# Patient Record
Sex: Female | Born: 1990 | Hispanic: No | Marital: Single | Smoking: Current some day smoker
Health system: Southern US, Community
[De-identification: ages and names within clinical notes are randomized; demographics above are authoritative.]

## PROBLEM LIST (undated history)

## (undated) ENCOUNTER — Inpatient Hospital Stay (HOSPITAL_COMMUNITY): Payer: Self-pay

## (undated) DIAGNOSIS — O1495 Unspecified pre-eclampsia, complicating the puerperium: Secondary | ICD-10-CM

## (undated) DIAGNOSIS — B1910 Unspecified viral hepatitis B without hepatic coma: Secondary | ICD-10-CM

## (undated) DIAGNOSIS — IMO0002 Reserved for concepts with insufficient information to code with codable children: Secondary | ICD-10-CM

## (undated) DIAGNOSIS — A491 Streptococcal infection, unspecified site: Secondary | ICD-10-CM

## (undated) DIAGNOSIS — R87619 Unspecified abnormal cytological findings in specimens from cervix uteri: Secondary | ICD-10-CM

## (undated) DIAGNOSIS — E669 Obesity, unspecified: Secondary | ICD-10-CM

## (undated) DIAGNOSIS — D649 Anemia, unspecified: Secondary | ICD-10-CM

## (undated) DIAGNOSIS — B977 Papillomavirus as the cause of diseases classified elsewhere: Secondary | ICD-10-CM

## (undated) HISTORY — PX: LEEP: SHX91

## (undated) HISTORY — DX: Obesity, unspecified: E66.9

## (undated) HISTORY — PX: DILATION AND CURETTAGE OF UTERUS: SHX78

## (undated) HISTORY — DX: Unspecified pre-eclampsia, complicating the puerperium: O14.95

---
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
## 2011-04-08 ENCOUNTER — Encounter (HOSPITAL_COMMUNITY): Payer: Self-pay | Admitting: *Deleted

## 2011-04-08 ENCOUNTER — Inpatient Hospital Stay (HOSPITAL_COMMUNITY)
Admission: AD | Admit: 2011-04-08 | Discharge: 2011-04-08 | Disposition: A | Discharge status: Home / Self Care | Payer: Self-pay | Source: Ambulatory Visit | Attending: Obstetrics & Gynecology | Admitting: Obstetrics & Gynecology

## 2011-04-08 DIAGNOSIS — O210 Mild hyperemesis gravidarum: Secondary | ICD-10-CM | POA: Insufficient documentation

## 2011-04-08 DIAGNOSIS — Z3201 Encounter for pregnancy test, result positive: Secondary | ICD-10-CM | POA: Insufficient documentation

## 2011-04-08 LAB — POCT PREGNANCY, URINE: Preg Test, Ur: POSITIVE

## 2011-04-08 LAB — URINALYSIS, ROUTINE W REFLEX MICROSCOPIC
Glucose, UA: NEGATIVE mg/dL
Leukocytes, UA: NEGATIVE
Specific Gravity, Urine: >1.03 — ABNORMAL HIGH (ref 1.005–1.030)
pH: 6 (ref 5.0–8.0)

## 2011-04-08 MED ORDER — PROMETHAZINE HCL 25 MG PO TABS: 25.0000 mg | ORAL_TABLET | Freq: Four times a day (QID) | ORAL | Status: AC | PRN

## 2011-04-08 NOTE — Progress Notes (Signed)
Pt states she had a POS HPT about one week ago. Has had some gas like pain on both sides above the waist level, no pain at this time. Pt states she has terminated a pregnancy and is concerned that everything is OK. No bleeding, some nausea, no vomiting, and fatigue.

## 2011-04-08 NOTE — ED Provider Notes (Signed)
History   Pt presents today c/o feeling tired, nausea, and some mild lower abd cramping that comes and goes. She denies vag dc, bleeding, or any other sx. She states she has had a termination in the past and she wants to make sure everything is ok with this preg.  Chief Complaint  Patient presents with  . Possible Pregnancy   HPI  OB History    Grav Para Term Preterm Abortions TAB SAB Ect Mult Living   5 2 2  0 2 2 0 0 0 2      No past medical history on file.  No past surgical history on file.  No family history on file.  History  Substance Use Topics  . Smoking status: Not on file  . Smokeless tobacco: Not on file  . Alcohol Use: Not on file    Allergies: No Known Allergies  No prescriptions prior to admission    Review of Systems  Constitutional: Negative for fever.  Cardiovascular: Negative for chest pain.  Gastrointestinal: Positive for nausea and abdominal pain. Negative for vomiting, diarrhea and constipation.  Genitourinary: Negative for dysuria, urgency, frequency and hematuria.  Neurological: Negative for dizziness and headaches.  Psychiatric/Behavioral: Negative for depression and suicidal ideas.   Physical Exam   Blood pressure 117/78, pulse 74, temperature 98.6 F (37 C), temperature source Oral, resp. rate 16, height 5\' 7"  (1.702 m), weight 164 lb 6.4 oz (74.571 kg), last menstrual period 02/19/2011, SpO2 99.00%.  Physical Exam  Constitutional: She is oriented to person, place, and time. She appears well-developed and well-nourished. No distress.  HENT:  Head: Normocephalic and atraumatic.  Eyes: EOM are normal. Pupils are equal, round, and reactive to light.  GI: Soft. She exhibits no distension. There is no tenderness. There is no rebound and no guarding.  Neurological: She is alert and oriented to person, place, and time.  Skin: Skin is warm and dry. She is not diaphoretic.  Psychiatric: She has a normal mood and affect. Her behavior is normal.  Judgment and thought content normal.    MAU Course  Procedures  Bedside US shows a single IUP with cardiac activity at 6.4wks.  Results for orders placed during the hospital encounter of 04/08/11 (from the past 24 hour(s))  URINALYSIS, ROUTINE W REFLEX MICROSCOPIC     Status: Abnormal   Collection Time   04/08/11 11:30 AM      Component Value Range   Color, Urine YELLOW  YELLOW    Appearance CLEAR  CLEAR    Specific Gravity, Urine >1.030 (*) 1.005 - 1.030    pH 6.0  5.0 - 8.0    Glucose, UA NEGATIVE  NEGATIVE (mg/dL)   Hgb urine dipstick NEGATIVE  NEGATIVE    Bilirubin Urine NEGATIVE  NEGATIVE    Ketones, ur NEGATIVE  NEGATIVE (mg/dL)   Protein, ur NEGATIVE  NEGATIVE (mg/dL)   Urobilinogen, UA 0.2  0.0 - 1.0 (mg/dL)   Nitrite NEGATIVE  NEGATIVE    Leukocytes, UA NEGATIVE  NEGATIVE   POCT PREGNANCY, URINE     Status: Normal   Collection Time   04/08/11 11:53 AM      Component Value Range   Preg Test, Ur POSITIVE       Assessment and Plan  Pregnancy: discussed with pt at length. Will give Rx for phenergan to use prn. She will begin prenatal care. Discussed diet, activity, risks, and precautions.  Clinton Gallant. Rice III, DrHSc, MPAS, PA-C  04/08/2011, 11:54 AM   Madaline Guthrie  Rice, PA 04/08/11 1213

## 2011-04-08 NOTE — ED Notes (Signed)
E.Rice,PA at bedside with u/s  Fetus with FHR observed in uterus. [redacted]w[redacted]d

## 2011-04-30 LAB — URINALYSIS, DIPSTICK ONLY
Glucose, UA: NEGATIVE
Nitrite: NEGATIVE
Specific Gravity, Urine: 1.02
pH: 6.5

## 2011-04-30 LAB — CBC
Hemoglobin: 12.1
RBC: 4.89
RDW: 15.1
WBC: 9.1

## 2011-05-01 ENCOUNTER — Inpatient Hospital Stay (HOSPITAL_COMMUNITY)
Admission: AD | Admit: 2011-05-01 | Discharge: 2011-05-01 | Disposition: A | Discharge status: Home / Self Care | Payer: Medicaid Other | Source: Ambulatory Visit | Attending: Obstetrics & Gynecology | Admitting: Obstetrics & Gynecology

## 2011-05-01 ENCOUNTER — Encounter (HOSPITAL_COMMUNITY): Payer: Self-pay

## 2011-05-01 DIAGNOSIS — O209 Hemorrhage in early pregnancy, unspecified: Secondary | ICD-10-CM

## 2011-05-01 HISTORY — DX: Anemia, unspecified: D64.9

## 2011-05-01 LAB — URINE MICROSCOPIC-ADD ON

## 2011-05-01 LAB — WET PREP, GENITAL: Yeast Wet Prep HPF POC: NONE SEEN

## 2011-05-01 LAB — URINALYSIS, ROUTINE W REFLEX MICROSCOPIC
Nitrite: NEGATIVE
Specific Gravity, Urine: >1.03 — ABNORMAL HIGH (ref 1.005–1.030)
Urobilinogen, UA: 0.2 mg/dL (ref 0.0–1.0)

## 2011-05-01 NOTE — ED Provider Notes (Signed)
Chief Complaint:  Vaginal Bleeding   Kent Braunschweig is  20 y.o. [phone].  Patient's last menstrual period was 02/19/2011..  [redacted]w[redacted]d Her pregnancy status is positive.  She presents complaining of Vaginal Bleeding . Onset is described as sudden and has been present for  2 days. BRB yesterday x 1 that then turned to brownish spotting. Denies bleeding after intercourse last night. BRB this morning, now c/o spotting.  Obstetrical/Gynecological History: OB History    Grav Para Term Preterm Abortions TAB SAB Ect Mult Living   5 2 2  0 2 2 0 0 0 2      Past Medical History: Past Medical History  Diagnosis Date  . Anemia   . Preterm labor     Past Surgical History: Past Surgical History  Procedure Date  . Dilation and curettage of uterus     Family History: No family history on file.  Social History: History  Substance Use Topics  . Smoking status: Current Everyday Smoker -- 0.0 packs/day    Types: Cigarettes  . Smokeless tobacco: Never Used   Comment: pt stated she is trying to quite down to 1 ciggarette/day  . Alcohol Use: No    Allergies: No Known Allergies  Prescriptions prior to admission  Medication Sig Dispense Refill  . prenatal vitamin w/FE, FA (PRENATAL 1 + 1) 27-1 MG TABS Take 1 tablet by mouth daily.          Review of Systems - Negative except what has been reviewed in the HPI  Physical Exam   Blood pressure 110/60, pulse 79, temperature 98.9 F (37.2 C), temperature source Oral, resp. rate 16, height 5\' 7"  (1.702 m), weight 75.206 kg (165 lb 12.8 oz), last menstrual period 02/19/2011.  General: General appearance - alert, well appearing, and in no distress and oriented to person, place, and time Abdomen - soft, nontender, nondistended, no masses or organomegaly Focused Gynecological Exam: VULVA: normal appearing vulva with no masses, tenderness or lesions, VAGINA: scant, dark red bleeding, CERVIX: normal appearing cervix without discharge or lesions, UTERUS:  uterus is normal size, shape, consistency and nontender, ADNEXA: normal adnexa in size, nontender and no masses  Labs: Recent Results (from the past 24 hour(s))  URINALYSIS, ROUTINE W REFLEX MICROSCOPIC   Collection Time   05/01/11  1:25 PM      Component Value Range   Color, Urine YELLOW  YELLOW    Appearance CLEAR  CLEAR    Specific Gravity, Urine >1.030 (*) 1.005 - 1.030    pH 6.0  5.0 - 8.0    Glucose, UA NEGATIVE  NEGATIVE (mg/dL)   Hgb urine dipstick LARGE (*) NEGATIVE    Bilirubin Urine NEGATIVE  NEGATIVE    Ketones, ur NEGATIVE  NEGATIVE (mg/dL)   Protein, ur NEGATIVE  NEGATIVE (mg/dL)   Urobilinogen, UA 0.2  0.0 - 1.0 (mg/dL)   Nitrite NEGATIVE  NEGATIVE    Leukocytes, UA NEGATIVE  NEGATIVE   URINE MICROSCOPIC-ADD ON   Collection Time   05/01/11  1:25 PM      Component Value Range   Squamous Epithelial / LPF FEW (*) RARE    WBC, UA 0-2  <3 (WBC/hpf)   RBC / HPF 21-50  <3 (RBC/hpf)   Bacteria, UA FEW (*) RARE   WET PREP, GENITAL   Collection Time   05/01/11  2:36 PM      Component Value Range   Yeast, Wet Prep NONE SEEN  NONE SEEN    Trich, Wet Prep NONE  SEEN  NONE SEEN    Clue Cells, Wet Prep NONE SEEN  NONE SEEN    WBC, Wet Prep HPF POC FEW (*) NONE SEEN    Imaging Studies:  Bedside US reveals viable IUP with cardiac activity. Measurements not performed   Assessment: First trimester bleeding in pregnancy   Plan: Discharge home Bleeding precautions Pelvic rest until appt with OB provider, referral list given  Jessly Lebeck E. 05/01/2011,3:10 PM

## 2011-05-01 NOTE — Progress Notes (Signed)
Patient reports last night had some bleeding on tissue, wearing a pad small amount, has a lot in the toilet when using the bathroom, brown last night, bright red today, some intermittent cramping.

## 2011-05-01 NOTE — Progress Notes (Signed)
Intercourse last night.

## 2011-05-01 NOTE — Progress Notes (Signed)
Pt states bleeding began ? Night before last, was dark brown, this am was bright red. Intermittent cramping, none present now. Vaginal d/c noted-yellowish brown.

## 2011-05-10 ENCOUNTER — Ambulatory Visit (INDEPENDENT_AMBULATORY_CARE_PROVIDER_SITE_OTHER): Payer: Medicaid Other | Admitting: Gynecology

## 2011-05-10 DIAGNOSIS — Z3480 Encounter for supervision of other normal pregnancy, unspecified trimester: Secondary | ICD-10-CM

## 2011-05-10 DIAGNOSIS — O3680X0 Pregnancy with inconclusive fetal viability, not applicable or unspecified: Secondary | ICD-10-CM

## 2011-05-10 DIAGNOSIS — Z23 Encounter for immunization: Secondary | ICD-10-CM

## 2011-05-11 LAB — OBSTETRIC PANEL
Antibody Screen: NEGATIVE
Eosinophils Relative: 2 % (ref 0–5)
HCT: 38.5 % (ref 36.0–46.0)
Lymphocytes Relative: 31 % (ref 12–46)
Lymphs Abs: 2.2 10*3/uL (ref 0.7–4.0)
MCV: 78.4 fL (ref 78.0–100.0)
Monocytes Absolute: 0.4 10*3/uL (ref 0.1–1.0)
RBC: 4.91 MIL/uL (ref 3.87–5.11)
Rubella: 50.7 IU/mL — ABNORMAL HIGH
WBC: 7 10*3/uL (ref 4.0–10.5)

## 2011-05-11 LAB — HIV ANTIBODY (ROUTINE TESTING W REFLEX): HIV: NONREACTIVE

## 2011-05-14 ENCOUNTER — Ambulatory Visit (HOSPITAL_COMMUNITY)
Admission: RE | Admit: 2011-05-14 | Discharge: 2011-05-14 | Disposition: A | Discharge status: Home / Self Care | Payer: Medicaid Other | Source: Ambulatory Visit | Attending: Obstetrics & Gynecology | Admitting: Obstetrics & Gynecology

## 2011-05-14 DIAGNOSIS — O09219 Supervision of pregnancy with history of pre-term labor, unspecified trimester: Secondary | ICD-10-CM | POA: Insufficient documentation

## 2011-05-14 DIAGNOSIS — Z3480 Encounter for supervision of other normal pregnancy, unspecified trimester: Secondary | ICD-10-CM

## 2011-05-14 DIAGNOSIS — O209 Hemorrhage in early pregnancy, unspecified: Secondary | ICD-10-CM | POA: Insufficient documentation

## 2011-05-14 DIAGNOSIS — O3680X0 Pregnancy with inconclusive fetal viability, not applicable or unspecified: Secondary | ICD-10-CM

## 2011-05-18 ENCOUNTER — Other Ambulatory Visit (HOSPITAL_COMMUNITY): Admission: RE | Admit: 2011-05-18 | Payer: Self-pay | Source: Ambulatory Visit | Admitting: Obstetrics and Gynecology

## 2011-05-18 ENCOUNTER — Encounter: Payer: Self-pay | Admitting: Obstetrics and Gynecology

## 2011-05-18 ENCOUNTER — Ambulatory Visit (INDEPENDENT_AMBULATORY_CARE_PROVIDER_SITE_OTHER): Payer: Self-pay | Admitting: Obstetrics and Gynecology

## 2011-05-18 DIAGNOSIS — B181 Chronic viral hepatitis B without delta-agent: Secondary | ICD-10-CM | POA: Insufficient documentation

## 2011-05-18 DIAGNOSIS — Z1272 Encounter for screening for malignant neoplasm of vagina: Secondary | ICD-10-CM

## 2011-05-18 DIAGNOSIS — N390 Urinary tract infection, site not specified: Secondary | ICD-10-CM

## 2011-05-18 DIAGNOSIS — O2390 Unspecified genitourinary tract infection in pregnancy, unspecified trimester: Secondary | ICD-10-CM

## 2011-05-18 DIAGNOSIS — Z113 Encounter for screening for infections with a predominantly sexual mode of transmission: Secondary | ICD-10-CM

## 2011-05-18 DIAGNOSIS — O2340 Unspecified infection of urinary tract in pregnancy, unspecified trimester: Secondary | ICD-10-CM

## 2011-05-18 DIAGNOSIS — Z3480 Encounter for supervision of other normal pregnancy, unspecified trimester: Secondary | ICD-10-CM

## 2011-05-18 DIAGNOSIS — B951 Streptococcus, group B, as the cause of diseases classified elsewhere: Secondary | ICD-10-CM

## 2011-05-18 MED ORDER — CEPHALEXIN 500 MG PO CAPS: 500.0000 mg | ORAL_CAPSULE | Freq: Three times a day (TID) | ORAL | Status: AC

## 2011-05-18 NOTE — Progress Notes (Signed)
   Subjective:    Stacey Carrillo is a [id number] [redacted]w[redacted]d being seen today for her first obstetrical visit.  Her obstetrical history is significant for first pregnancy without any prenatal care, patient suspects she was [redacted] weeks pregnant at the time of delivery. Infant was discharged home with mom after 2 days. Patient does intend to breast feed. Pregnancy history fully reviewed.  Patient reports fatigue.  Filed Vitals:   05/18/11 1400  BP: 116/65  Weight: 168 lb (76.204 kg)    HISTORY: OB History    Grav Para Term Preterm Abortions TAB SAB Ect Mult Living   5 2 1 1 2 2  0 0 0 2     # Outc Date GA Lbr Len/2nd Wgt Sex Del Anes PTL Lv   1 PRE 9/07   6lb2oz(2.778kg) M SVD Gen  Yes   2 TRM 7/09 [redacted]w[redacted]d  6lb7oz(2.92kg) F SVD EPI  Yes   3 TAB 4/11 [redacted]w[redacted]d       No   4 TAB 9/11 [redacted]w[redacted]d       No   5 CUR              Past Medical History  Diagnosis Date  . Anemia   . Preterm labor    Past Surgical History  Procedure Date  . Dilation and curettage of uterus    Family History  Problem Relation Age of Onset  . Hearing loss Brother   . Asthma Son      Exam    Uterine Size: size equals dates  Pelvic Exam:    Perineum: No Hemorrhoids, Normal Perineum   Vulva: normal   Vagina:  normal mucosa, normal discharge   pH: n/a   Cervix: closed and long   Adnexa: normal adnexa and no mass, fullness, tenderness   Bony Pelvis: adequate  System: Breast:  normal appearance, no masses or tenderness, No nipple discharge or bleeding   Skin: normal coloration and turgor, no rashes    Neurologic: oriented, grossly non-focal   Extremities: normal strength, tone, and muscle mass, no erythema, induration, or nodules   HEENT PERRLA   Mouth/Teeth mucous membranes moist, pharynx normal without lesions   Neck supple and no masses   Cardiovascular: regular rate and rhythm   Respiratory:  chest clear to auscultation bilaterally   Abdomen: soft, non-tender; bowel sounds normal; no masses,  no organomegaly   Urinary: n/a      Assessment:    Pregnancy: [id number] Patient Active Problem List  Diagnoses  . GBS (group B streptococcus) UTI complicating pregnancy  . Hepatitis B carrier        Plan:     Initial labs drawn. Prenatal vitamins. Problem list reviewed and updated. Genetic Screening discussed First Screen and Quad Screen: offered. Patient will decide at a later visit if interested in quad screen.  Ultrasound discussed; fetal survey: requested.  Follow up in 4 weeks. Catalina Antigua 05/18/2011

## 2011-05-18 NOTE — Patient Instructions (Addendum)
Pregnancy - First Trimester During sexual intercourse, millions of sperm go into the vagina. Only 1 sperm will penetrate and fertilize the female egg while it is in the Fallopian tube. One week later, the fertilized egg implants into the wall of the uterus. An embryo begins to develop into a baby. At 6 to 8 weeks, the eyes and face are formed and the heartbeat can be seen on ultrasound. At the end of 12 weeks (first trimester), all the baby's organs are formed. Now that you are pregnant, you will want to do everything you can to have a healthy baby. Two of the most important things are to get good prenatal care and follow your caregiver's instructions. Prenatal care is all the medical care you receive before the baby's birth. It is given to prevent, find and treat problems during the pregnancy and childbirth. PRENATAL EXAMS:  During prenatal visits, your weight, blood pressure and urine are checked. This is done to make sure you are healthy and progressing normally during the pregnancy.   A pregnant woman should gain 25 to 35 pounds during the pregnancy. However, if you are over weight or underweight, your caregiver will advise you regarding your weight.   Your caregiver will ask and answer questions for you.   Blood work, cervical cultures, other necessary tests and a Pap test are done during your prenatal exams. These tests are done to check on your health and the probable health of your baby. Tests are strongly recommended and done for HIV with your permission. This is the virus that causes AIDS. These tests are done because medications can be given to help prevent your baby from being born with this infection should you have been infected without knowing it. Blood work is also used to find out your blood type, previous infections and follow your blood levels (hemoglobin).   Low hemoglobin (anemia) is common during pregnancy. Iron and vitamins are given to help prevent this. Later in the pregnancy,  blood tests for diabetes will be done along with any other tests if any problems develop. You may need tests to make sure you and the baby are doing well.   You may need other tests to make sure you and the baby are doing well.  CHANGES DURING THE FIRST TRIMESTER (THE FIRST 3 MONTHS OF PREGNANCY) Your body goes through many changes during pregnancy. They vary from person to person. Talk to your caregiver about changes you notice and are concerned about. Changes can include:  Your menstrual period stops.   The egg and sperm carry the genes that determine what you look like. Genes from you and your partner are forming a baby. The female genes determine whether the baby is a boy or a girl.   Your body increases in girth and you may feel bloated.   Feeling sick to your stomach (nauseous) and throwing up (vomiting). If the vomiting is uncontrollable, call your caregiver.   Your breasts will begin to enlarge and become tender.   Your nipples may stick out more and become darker.   The need to urinate more. Painful urination may mean you have a bladder infection.   Tiring easily.   Loss of appetite.   Cravings for certain kinds of food.   At first, you may gain or lose a couple of pounds.   You may have changes in your emotions from day to day (excited to be pregnant or concerned something may go wrong with the pregnancy and baby).     You may have more vivid and strange dreams.  HOME CARE INSTRUCTIONS  It is very important to avoid all smoking, alcohol and un-prescribed drugs during your pregnancy. These affect the formation and growth of the baby. Avoid chemicals while pregnant to ensure the delivery of a healthy infant.   Start your prenatal visits by the 12th week of pregnancy. They are usually scheduled monthly at first, then more often in the last 2 months before delivery. Keep your caregiver's appointments. Follow your caregiver's instructions regarding medication use, blood and lab  tests, exercise, and diet.   During pregnancy, you are providing food for you and your baby. Eat regular, well-balanced meals. Choose foods such as meat, fish, milk and other low fat dairy products, vegetables, fruits, and whole-grain breads and cereals. Your caregiver will tell you of the ideal weight gain.   You can help morning sickness by keeping soda crackers (saltines) at the bedside. Eat a couple before arising in the morning. You may want to use the crackers without salt on them.   Eating 4 to 5 small meals rather than 3 large meals a day also may help the nausea and vomiting.   Drinking liquids between meals instead of during meals also seems to help nausea and vomiting.   A physical sexual relationship may be continued throughout pregnancy if there are no other problems. Problems may be early (premature) leaking of amniotic fluid from the membranes, vaginal bleeding, or belly (abdominal) pain.   Exercise regularly if there are no restrictions. Check with your caregiver or physical therapist if you are unsure of the safety of some of your exercises. Greater weight gain will occur in the last 2 trimesters of pregnancy. Exercising will help:   Control your weight.   Keep you in shape.   Prepare you for labor and delivery.   Help you lose your pregnancy weight after you deliver your baby.   Wear a good support or jogging bra for breast tenderness during pregnancy. This may help if worn during sleep too.   Ask when prenatal classes are available. Begin classes when they are offered.   Do not use hot tubs, steam rooms or saunas.   Wear your seat belt when driving. This protects you and your baby if you are in an accident.   Avoid raw meat, uncooked cheese, cat litter boxes and soil used by cats throughout the pregnancy. These carry germs that can cause birth defects in the baby.   The first trimester is a good time to visit your dentist for your dental health. Getting your teeth  cleaned is OK. Use a softer toothbrush and brush gently during pregnancy.   Ask for help if you have financial, counseling or nutritional needs during pregnancy. Your caregiver will be able to offer counseling for these needs as well as refer you for other special needs.   Do not take any medications or herbs unless told by your caregiver.   Inform your caregiver if there is any mental or physical domestic violence.   Make a list of emergency phone numbers of family, friends, hospital, police and fire department.   Write down your questions. Take them to your prenatal visit.   Do not douche.   Do not cross your legs.   If you have to stand for long periods of time, rotate you feet or take small steps in a circle.   You may have more vaginal secretions that may require a sanitary pad. Do not use tampons   or scented sanitary pads.  MEDICATIONS AND DRUG USE IN PREGNANCY  Take prenatal vitamins as directed. The vitamin should contain 1 milligram of folic acid. Keep all vitamins out of reach of children. Only a couple vitamins or tablets containing iron may be fatal to a baby or young child when ingested.   Avoid use of all medications, including herbs, over-the-counter medications, not prescribed or suggested by your caregiver. Only take over-the-counter or prescription medicines for pain, discomfort, or fever as directed by your caregiver. Do not use aspirin, ibuprofen (Motrin, Advil, Nuprin) or naproxen (Aleve) unless OK'd by your caregiver.   Let your caregiver also know about herbs you may be using.   Alcohol is related to a number of birth defects. This includes fetal alcohol syndrome. All alcohol, in any form, should be avoided completely. Smoking will cause low birth rate and premature babies.   Street/illegal drugs are very harmful to the baby. They are absolutely forbidden. A baby born to an addicted mother will be addicted at birth. The baby will go through the same withdrawal  an adult does.   Let your caregiver know about any medications that you have to take and for what reason you take them.  MISCARRIAGE IS COMMON DURING PREGNANCY A miscarriage does not mean you did something wrong. It is not a reason to worry about getting pregnant again. Your caregiver will help you with questions you may have. If you have a miscarriage, you may need minor surgery (a D & C). SEEK MEDICAL CARE IF:  You have any concerns or worries during your pregnancy. It is better to call with your questions if you feel they cannot wait, rather than worry about them.  SEEK IMMEDIATE MEDICAL CARE IF:  An unexplained oral temperature above 100.4 develops, or as your caregiver suggests.   You have leaking of fluid from the vagina (birth canal). If leaking membranes are suspected, take your temperature and inform your caregiver of this when you call.   There is vaginal spotting or bleeding. Notify your caregiver of the amount and how many pads are used.   You develop a bad smelling vaginal discharge with a change in the color.   You continue to feel sick to your stomach (nauseated) and have no relief from remedies suggested. You vomit blood or coffee ground like materials.   You lose more than 2 pounds of weight in one week.   You gain more than 2 pounds of weight in a week and you notice swelling of your face, hands, feet or legs.   You gain 5 pounds or more in 1 week (even if you do not have swelling of your hands, face, legs or feet).   You get exposed to [place] measles and have never had them.   You are exposed to fifth disease or chicken pox.   You develop belly (abdominal) pain. Round ligament discomfort is a common non-cancerous (benign) cause of abdominal pain in pregnancy. Your caregiver still must evaluate this.   You develop headache, fever, diarrhea, pain with urination, or shortness of breath.   You fall, are in a car accident or have any kind of trauma.   There is mental  or physical violence in your home.  Document Released: 07/13/2001 Document Re-Released: 01/06/2010 [facility] Patient Information 2011 [place], [place]. Pregnancy - Second Trimester The second trimester of pregnancy (3 to 6 months) is a period of rapid growth for you and your baby. At the end of the sixth month, your  baby is about 9 inches long and weighs 1 1/2 pounds. You will begin to feel the baby move between 18 and 20 weeks of the pregnancy. This is called quickening. Weight gain is faster. A clear fluid (colostrum) may leak out of your breasts. You may feel small contractions of the womb (uterus). This is known as false labor or Braxton-Hicks contractions. This is like a practice for labor when the baby is ready to be born. Usually, the problems with morning sickness have usually passed by the end of your first trimester. Some women develop small dark blotches (called cholasma, mask of pregnancy) on their face that usually goes away after the baby is born. Exposure to the sun makes the blotches worse. Acne may also develop in some pregnant women and pregnant women who have acne, may find that it goes away. PRENATAL EXAMS  Blood work may continue to be done during prenatal exams. These tests are done to check on your health and the probable health of your baby. Blood work is used to follow your blood levels (hemoglobin). Anemia (low hemoglobin) is common during pregnancy. Iron and vitamins are given to help prevent this. You will also be checked for diabetes between 24 and 28 weeks of the pregnancy. Some of the previous blood tests may be repeated.   The size of the uterus is measured during each visit. This is to make sure that the baby is continuing to grow properly according to the dates of the pregnancy.   Your blood pressure is checked every prenatal visit. This is to make sure you are not getting toxemia.   Your urine is checked to make sure you do not have an infection, diabetes or protein in  the urine.   Your weight is checked often to make sure gains are happening at the suggested rate. This is to ensure that both you and your baby are growing normally.   Sometimes, an ultrasound is performed to confirm the proper growth and development of the baby. This is a test which bounces harmless sound waves off the baby so your caregiver can more accurately determine due dates.  Sometimes, a specialized test is done on the amniotic fluid surrounding the baby. This test is called an amniocentesis. The amniotic fluid is obtained by sticking a needle into the belly (abdomen). This is done to check the chromosomes in instances where there is a concern about possible genetic problems with the baby. It is also sometimes done near the end of pregnancy if an early delivery is required. In this case, it is done to help make sure the baby's lungs are mature enough for the baby to live outside of the womb. CHANGES OCCURING IN THE SECOND TRIMESTER OF PREGNANCY Your body goes through many changes during pregnancy. They vary from person to person. Talk to your caregiver about changes you notice that you are concerned about.  During the second trimester, you will likely have an increase in your appetite. It is normal to have cravings for certain foods. This varies from person to person and pregnancy to pregnancy.   Your lower abdomen will begin to bulge.   You may have to urinate more often because the uterus and baby are pressing on your bladder. It is also common to get more bladder infections during pregnancy (pain with urination). You can help this by drinking lots of fluids and emptying your bladder before and after intercourse.   You may begin to get stretch marks on your hips,  abdomen, and breasts. These are normal changes in the body during pregnancy. There are no exercises or medications to take that prevent this change.   You may begin to develop swollen and bulging veins (varicose veins) in your  legs. Wearing support hose, elevating your feet for 15 minutes, 3 to 4 times a day and limiting salt in your diet helps lessen the problem.   Heartburn may develop as the uterus grows and pushes up against the stomach. Antacids recommended by your caregiver helps with this problem. Also, eating smaller meals 4 to 5 times a day helps.   Constipation can be treated with a stool softener or adding bulk to your diet. Drinking lots of fluids, vegetables, fruits, and whole grains are helpful.   Exercising is also helpful. If you have been very active up until your pregnancy, most of these activities can be continued during your pregnancy. If you have been less active, it is helpful to start an exercise program such as walking.   Hemorrhoids (varicose veins in the rectum) may develop at the end of the second trimester. Warm sitz baths and hemorrhoid cream recommended by your caregiver helps hemorrhoid problems.   Backaches may develop during this time of your pregnancy. Avoid heavy lifting, wear low heal shoes and practice good posture to help with backache problems.   Some pregnant women develop tingling and numbness of their hand and fingers because of swelling and tightening of ligaments in the wrist (carpel tunnel syndrome). This goes away after the baby is born.   As your breasts enlarge, you may have to get a bigger bra. Get a comfortable, cotton, support bra. Do not get a nursing bra until the last month of the pregnancy if you will be nursing the baby.   You may get a dark line from your belly button to the pubic area called the linea nigra.   You may develop rosy cheeks because of increase blood flow to the face.   You may develop spider looking lines of the face, neck, arms and chest. These go away after the baby is born.  HOME CARE INSTRUCTIONS  It is extremely important to avoid all smoking, herbs, alcohol, and un-prescribed drugs during your pregnancy. These chemicals affect the  formation and growth of the baby. Avoid these chemicals throughout the pregnancy to ensure the delivery of a healthy infant.   Most of your home care instructions are the same as suggested for the first trimester of your pregnancy. Keep your caregiver's appointments. Follow your caregiver's instructions regarding medication use, exercise and diet.   During pregnancy, you are providing food for you and your baby. Continue to eat regular, well-balanced meals. Choose foods such as meat, fish, milk and other low fat dairy products, vegetables, fruits, and whole-grain breads and cereals. Your caregiver will tell you of the ideal weight gain.   A physical sexual relationship may be continued up until near the end of pregnancy if there are no other problems. Problems could include early (premature) leaking of amniotic fluid from the membranes, vaginal bleeding, abdominal pain, or other medical or pregnancy problems.   Exercise regularly if there are no restrictions. Check with your caregiver if you are unsure of the safety of some of your exercises. The greatest weight gain will occur in the last 2 trimesters of pregnancy. Exercise will help you:   Control your weight.   Get you in shape for labor and delivery.   Lose weight after you have the baby.  Wear a good support or jogging bra for breast tenderness during pregnancy. This may help if worn during sleep. Pads or tissues may be used in the bra if you are leaking colostrum.   Do not use hot tubs, steam rooms or saunas throughout the pregnancy.   Wear your seat belt at all times when driving. This protects you and your baby if you are in an accident.   Avoid raw meat, uncooked cheese, cat litter boxes and soil used by cats. These carry germs that can cause birth defects in the baby.   The second trimester is also a good time to visit your dentist for your dental health if this has not been done yet. Getting your teeth cleaned is OK. Use a soft  toothbrush. Brush gently during pregnancy.   It is easier to loose urine during pregnancy. Tightening up and strengthening the pelvic muscles will help with this problem. Practice stopping your urination while you are going to the bathroom. These are the same muscles you need to strengthen. It is also the muscles you would use as if you were trying to stop from passing gas. You can practice tightening these muscles up 10 times a set and repeating this about 3 times per day. Once you know what muscles to tighten up, do not perform these exercises during urination. It is more likely to contribute to an infection by backing up the urine.   Ask for help if you have financial, counseling or nutritional needs during pregnancy. Your caregiver will be able to offer counseling for these needs as well as refer you for other special needs.   Your skin may become oily. If so, wash your face with mild soap, use non-greasy moisturizer and oil or cream based makeup.  MEDICATIONS AND DRUG USE IN PREGNANCY  Take prenatal vitamins as directed. The vitamin should contain 1 milligram of folic acid. Keep all vitamins out of reach of children. Only a couple vitamins or tablets containing iron may be fatal to a baby or young child when ingested.   Avoid use of all medications, including herbs, over-the-counter medications, not prescribed or suggested by your caregiver. Only take over-the-counter or prescription medicines for pain, discomfort, or fever as directed by your caregiver. Do not use aspirin.   Let your caregiver also know about herbs you may be using.   Alcohol is related to a number of birth defects. This includes fetal alcohol syndrome. All alcohol, in any form, should be avoided completely. Smoking will cause low birth rate and premature babies.   Street/illegal drugs are very harmful to the baby. They are absolutely forbidden. A baby born to an addicted mother will be addicted at birth. The baby will go  through the same withdrawal an adult does.  SEEK MEDICAL CARE IF:  You have any concerns or worries during your pregnancy. It is better to call with your questions if you feel they cannot wait, rather than worry about them.  SEEK IMMEDIATE MEDICAL CARE IF:  An unexplained oral temperature above 100.4 develops, or as your caregiver suggests.   You have leaking of fluid from the vagina (birth canal). If leaking membranes are suspected, take your temperature and tell your caregiver of this when you call.   There is vaginal spotting, bleeding, or passing clots. Tell your caregiver of the amount and how many pads are used. Light spotting in pregnancy is common, especially following intercourse.   You develop a bad smelling vaginal discharge with a change  in the color from clear to white.   You continue to feel sick to your stomach (nauseated) and have no relief from remedies suggested. You vomit blood or coffee ground like materials.   You loose more than 2 pounds of weight or gain more than 2 pounds of weight over a weeks time, or as suggested by your caregiver.   You notice swelling of your face, hands, and feet or legs.   You get exposed to [place] measles and have never had them.   You are exposed to fifth disease or chicken pox.   You develop belly (abdominal) pain. Round ligament discomfort is a common non-cancerous (benign) cause of abdominal pain in pregnancy. Your caregiver still must evaluate you.   You develop a bad headache that does not go away.   You develop fever, diarrhea, pain with urination or shortness of breath.   You develop visual problems, blurry or double vision.   You fall, are in a car accident or any kind of trauma.   There is mental or physical violence at home.  Document Released: 07/13/2001 Document Re-Released: 10/13/2009 [facility] Patient Information 2011 [place], [place]. AFP Maternal   This is a routine screen (tests) used to check for fetal  abnormalities such as Down syndrome and neural tube defects. Down Syndrome is a chromosomal abnormality, sometimes called Trisomy 12. Neural tube defects are serious birth defects. The brain, spinal cord, or their coverings do not develop completely. Women should be tested in the 15th to 20th week of pregnancy. The msAFP screen involves three or four tests that measure substances found in the blood that make the testing better. During development, AFP levels in fetal blood and amniotic fluid rise until about 12 weeks. The levels then gradually fall until birth. AFP is a protein produce by fetal tissue. AFP crosses the placenta and appears in the maternal blood. A baby with an open neural tube defect has an opening in its spine, head, or abdominal wall that allows higher-than-usual amounts of AFP to pass into the mother's blood.   If a screen is positive, more tests are needed to make a diagnosis. These include ultrasound and perhaps amniocentesis (checking the fluid that surrounds the baby). These tests are used to help women and their caregivers make decisions about the management of their pregnancies.   In pregnancies where the fetus is carrying the chromosomal defect that results in Down syndrome, the levels of AFP and unconjugated estriol tend to be low and hCG and inhibin A levels high.    PREPARATION Blood is drawn from a vein in your arm usually between the 15th and 20th weeks of pregnancy. Four different tests on your blood are done. These are AFP, hCG, unconjugated estriol, and inhibin A. The combination of tests produces a more accurate result.   NORMAL VALUES Adult: less than 40ng/mL or less than 40 mg/L (SI units) Child younger than1 year: less than 30 ng/mL Ranges are stratified by weeks of gestation and vary among laboratories.   MEANING OF TEST These are screening tests. Not all fetal abnormalities will give positive test results. Of all women who have positive AFP screening results,  only a very small number of them have babies who actually have a neural tube defect or chromosomal abnormality.  Your caregiver will go over the test results with you and discuss the importance and meaning of your results, as well as treatment options and the need for additional tests if necessary.   OBTAINING THE TEST  RESULTS It is your responsibility to obtain your test results.  Ask the lab or department performing the test when and how you will get your results.   "Normal" ranges for lab values and other tests may vary among different laboratories and/or hospitals.  You should always check with your doctor after having lab work or other tests done to discuss the meaning of your test results and whether or not your values are considered "within normal limits".   Document Released: 10/15/2008  Document Re-Released: 08/10/2009 [facility] Patient Information 2011 [place], [place].

## 2011-05-18 NOTE — Progress Notes (Signed)
Addended by: Barbara Cower on: 05/18/2011 05:15 PM   Modules accepted: Orders

## 2011-06-14 ENCOUNTER — Encounter: Payer: Self-pay | Admitting: Obstetrics & Gynecology

## 2011-06-21 ENCOUNTER — Other Ambulatory Visit: Payer: Self-pay | Admitting: Obstetrics & Gynecology

## 2011-06-21 ENCOUNTER — Ambulatory Visit (INDEPENDENT_AMBULATORY_CARE_PROVIDER_SITE_OTHER): Payer: Self-pay | Admitting: Obstetrics & Gynecology

## 2011-06-21 VITALS — BP 122/65 | Wt 167.0 lb

## 2011-06-21 DIAGNOSIS — Z3689 Encounter for other specified antenatal screening: Secondary | ICD-10-CM

## 2011-06-21 DIAGNOSIS — Z3480 Encounter for supervision of other normal pregnancy, unspecified trimester: Secondary | ICD-10-CM

## 2011-06-21 NOTE — Progress Notes (Signed)
She is here for a regularly scheduled visit.  She has some round ligament pain. We discussed 20 pound recommended weight gain.  She declines a quad screen. I will check a CMET today to check her LFTs.

## 2011-06-22 LAB — COMPREHENSIVE METABOLIC PANEL
ALT: 17 U/L (ref 0–35)
Alkaline Phosphatase: 46 U/L (ref 39–117)
BUN: 5 mg/dL — ABNORMAL LOW (ref 6–23)
Glucose, Bld: 84 mg/dL (ref 70–99)
Total Bilirubin: 0.3 mg/dL (ref 0.3–1.2)

## 2011-06-30 ENCOUNTER — Ambulatory Visit (HOSPITAL_COMMUNITY)
Admission: RE | Admit: 2011-06-30 | Discharge: 2011-06-30 | Disposition: A | Discharge status: Home / Self Care | Payer: Medicaid Other | Source: Ambulatory Visit | Attending: Obstetrics & Gynecology | Admitting: Obstetrics & Gynecology

## 2011-06-30 DIAGNOSIS — Z3689 Encounter for other specified antenatal screening: Secondary | ICD-10-CM | POA: Insufficient documentation

## 2011-06-30 DIAGNOSIS — Z3480 Encounter for supervision of other normal pregnancy, unspecified trimester: Secondary | ICD-10-CM

## 2011-07-20 ENCOUNTER — Ambulatory Visit (INDEPENDENT_AMBULATORY_CARE_PROVIDER_SITE_OTHER): Payer: Medicaid Other | Admitting: Obstetrics and Gynecology

## 2011-07-20 DIAGNOSIS — O2340 Unspecified infection of urinary tract in pregnancy, unspecified trimester: Secondary | ICD-10-CM

## 2011-07-20 DIAGNOSIS — O2390 Unspecified genitourinary tract infection in pregnancy, unspecified trimester: Secondary | ICD-10-CM

## 2011-07-20 DIAGNOSIS — N390 Urinary tract infection, site not specified: Secondary | ICD-10-CM

## 2011-07-20 DIAGNOSIS — R87610 Atypical squamous cells of undetermined significance on cytologic smear of cervix (ASC-US): Secondary | ICD-10-CM

## 2011-07-20 DIAGNOSIS — B951 Streptococcus, group B, as the cause of diseases classified elsewhere: Secondary | ICD-10-CM

## 2011-07-20 NOTE — Progress Notes (Signed)
Patient presenting today for colposcopy (pap ASCUS +HPV). After informed consent was obtained, colposcopy was performed, ectropion of pregnancy visualized. No gross lesions seen. No biopsy or ECC collected. Patient also reports the presence of a skin tag on her vulva. 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 No other lesions visualized. Patient declined treatment at this time but will continue to monitor. Patient to have repeat pap smear at 6 weeks postpartum

## 2011-07-20 NOTE — Patient Instructions (Signed)
Genital Warts Genital warts are a sexually transmitted infection. They may appear as small bumps on the tissues of the genital area. CAUSES  Genital warts are caused by a virus called human papillomavirus (HPV). HPV is the most common sexually transmitted disease (STD) and infection of the sex organs. This infection is spread by having unprotected sex with an infected person. It can be spread by vaginal, anal, and oral sex. Many people do not know they are infected. They may be infected for years without problems. However, even if they do not have problems, they can unknowingly pass the infection to their sexual partners. SYMPTOMS   Itching and irritation in the genital area.   Warts that bleed.   Painful sexual intercourse.  DIAGNOSIS  Warts are usually recognized with the naked eye on the vagina, vulva, perineum, anus, and rectum. Certain tests can also diagnose genital warts, such as:  A Pap test.   A tissue sample (biopsy) exam.   Colposcopy. A magnifying tool is used to examine the vagina and cervix. The HPV cells will change color when certain solutions are used.  TREATMENT  Warts can be removed by:  Applying certain chemicals, such as cantharidin or podophyllin.   Liquid nitrogen freezing (cryotherapy).   Immunotherapy with candida or trichophyton injections.   Laser treatment.   Burning with an electrified probe (electrocautery).   Interferon injections.   Surgery.  PREVENTION  HPV vaccination can help prevent HPV infections that cause genital warts and that cause cancer of the cervix. It is recommended that the vaccination be given to people between the ages 9 to 26 years old. The vaccine might not work as well or might not work at all if you already have HPV. It should not be given to pregnant women. HOME CARE INSTRUCTIONS   It is important to follow your caregiver's instructions. The warts will not go away without treatment. Repeat treatments are often needed to get  rid of warts. Even after it appears that the warts are gone, the normal tissue underneath often remains infected.   Do not try to treat genital warts with medicine used to treat hand warts. This type of medicine is strong and can burn the skin in the genital area, causing more damage.   Tell your past and current sexual partner(s) that you have genital warts. They may be infected also and need treatment.   Avoid sexual contact while being treated.   Do not touch or scratch the warts. The infection may spread to other parts of your body.   Women with genital warts should have a cervical cancer check (Pap test) at least once a year. This type of cancer is slow-growing and can be cured if found early. Chances of developing cervical cancer are increased with HPV.   Inform your obstetrician about your warts in the event of pregnancy. This virus can be passed to the baby's respiratory tract. Discuss this with your caregiver.   Use a condom during sexual intercourse. Following treatment, the use of condoms will help prevent reinfection.   Ask your caregiver about using over-the-counter anti-itch creams.  SEEK MEDICAL CARE IF:   Your treated skin becomes red, swollen, or painful.   You have a fever.   You feel generally ill.   You feel little lumps in and around your genital area.   You are bleeding or have painful sexual intercourse.  MAKE SURE YOU:   Understand these instructions.   Will watch your condition.   Will   get help right away if you are not doing well or get worse.  Document Released: 07/16/2000 Document Revised: 04/01/2011 Document Reviewed: 01/25/2011 [employer] Patient Information 2012 [employer]. 

## 2011-08-03 NOTE — L&D Delivery Note (Signed)
Delivery Note At 12:30 AM a viable female was delivered via Vaginal, Spontaneous Delivery (Presentation: ; Occiput Anterior).  APGAR: 9, 9; weight 8 lb 9.9 oz (3910 g).   Placenta status: Intact, Spontaneous.  Cord: 3 vessels with the following complications: loose nuchal x 1, reduced prior to del. Pt del in hands and knees; no difficulty with del of shoulders.  Anesthesia: None  Episiotomy: None Lacerations: None Est. Blood Loss (mL): 250  Mom to postpartum.  Baby to nursery-stable.  Cam Hai 11/24/2011, 1:53 AM

## 2011-08-05 ENCOUNTER — Encounter: Payer: Self-pay | Admitting: Obstetrics & Gynecology

## 2011-08-05 DIAGNOSIS — IMO0002 Reserved for concepts with insufficient information to code with codable children: Secondary | ICD-10-CM | POA: Insufficient documentation

## 2011-08-23 ENCOUNTER — Ambulatory Visit (INDEPENDENT_AMBULATORY_CARE_PROVIDER_SITE_OTHER): Payer: Medicaid Other | Admitting: Family Medicine

## 2011-08-23 ENCOUNTER — Encounter: Payer: Self-pay | Admitting: Family Medicine

## 2011-08-23 DIAGNOSIS — Z3480 Encounter for supervision of other normal pregnancy, unspecified trimester: Secondary | ICD-10-CM | POA: Insufficient documentation

## 2011-08-23 DIAGNOSIS — IMO0002 Reserved for concepts with insufficient information to code with codable children: Secondary | ICD-10-CM

## 2011-08-23 NOTE — Progress Notes (Signed)
Addended by: Reva Bores on: 08/23/2011 04:02 PM   Modules accepted: Orders

## 2011-08-23 NOTE — Progress Notes (Signed)
Marginal insertion of cord--schedule u/s---28 wks labs today.

## 2011-08-23 NOTE — Patient Instructions (Signed)
Pregnancy - Second Trimester The second trimester of pregnancy (3 to 6 months) is a period of rapid growth for you and your baby. At the end of the sixth month, your baby is about 9 inches long and weighs 1 1/2 pounds. You will begin to feel the baby move between 18 and 20 weeks of the pregnancy. This is called quickening. Weight gain is faster. A clear fluid (colostrum) may leak out of your breasts. You may feel small contractions of the womb (uterus). This is known as false labor or Braxton-Hicks contractions. This is like a practice for labor when the baby is ready to be born. Usually, the problems with morning sickness have usually passed by the end of your first trimester. Some women develop small dark blotches (called cholasma, mask of pregnancy) on their face that usually goes away after the baby is born. Exposure to the sun makes the blotches worse. Acne may also develop in some pregnant women and pregnant women who have acne, may find that it goes away. PRENATAL EXAMS  Blood work may continue to be done during prenatal exams. These tests are done to check on your health and the probable health of your baby. Blood work is used to follow your blood levels (hemoglobin). Anemia (low hemoglobin) is common during pregnancy. Iron and vitamins are given to help prevent this. You will also be checked for diabetes between 24 and 28 weeks of the pregnancy. Some of the previous blood tests may be repeated.   The size of the uterus is measured during each visit. This is to make sure that the baby is continuing to grow properly according to the dates of the pregnancy.   Your blood pressure is checked every prenatal visit. This is to make sure you are not getting toxemia.   Your urine is checked to make sure you do not have an infection, diabetes or protein in the urine.   Your weight is checked often to make sure gains are happening at the suggested rate. This is to ensure that both you and your baby are  growing normally.   Sometimes, an ultrasound is performed to confirm the proper growth and development of the baby. This is a test which bounces harmless sound waves off the baby so your caregiver can more accurately determine due dates.  Sometimes, a specialized test is done on the amniotic fluid surrounding the baby. This test is called an amniocentesis. The amniotic fluid is obtained by sticking a needle into the belly (abdomen). This is done to check the chromosomes in instances where there is a concern about possible genetic problems with the baby. It is also sometimes done near the end of pregnancy if an early delivery is required. In this case, it is done to help make sure the baby's lungs are mature enough for the baby to live outside of the womb. CHANGES OCCURING IN THE SECOND TRIMESTER OF PREGNANCY Your body goes through many changes during pregnancy. They vary from person to person. Talk to your caregiver about changes you notice that you are concerned about.  During the second trimester, you will likely have an increase in your appetite. It is normal to have cravings for certain foods. This varies from person to person and pregnancy to pregnancy.   Your lower abdomen will begin to bulge.   You may have to urinate more often because the uterus and baby are pressing on your bladder. It is also common to get more bladder infections during pregnancy (  pain with urination). You can help this by drinking lots of fluids and emptying your bladder before and after intercourse.   You may begin to get stretch marks on your hips, abdomen, and breasts. These are normal changes in the body during pregnancy. There are no exercises or medications to take that prevent this change.   You may begin to develop swollen and bulging veins (varicose veins) in your legs. Wearing support hose, elevating your feet for 15 minutes, 3 to 4 times a day and limiting salt in your diet helps lessen the problem.    Heartburn may develop as the uterus grows and pushes up against the stomach. Antacids recommended by your caregiver helps with this problem. Also, eating smaller meals 4 to 5 times a day helps.   Constipation can be treated with a stool softener or adding bulk to your diet. Drinking lots of fluids, vegetables, fruits, and whole grains are helpful.   Exercising is also helpful. If you have been very active up until your pregnancy, most of these activities can be continued during your pregnancy. If you have been less active, it is helpful to start an exercise program such as walking.   Hemorrhoids (varicose veins in the rectum) may develop at the end of the second trimester. Warm sitz baths and hemorrhoid cream recommended by your caregiver helps hemorrhoid problems.   Backaches may develop during this time of your pregnancy. Avoid heavy lifting, wear low heal shoes and practice good posture to help with backache problems.   Some pregnant women develop tingling and numbness of their hand and fingers because of swelling and tightening of ligaments in the wrist (carpel tunnel syndrome). This goes away after the baby is born.   As your breasts enlarge, you may have to get a bigger bra. Get a comfortable, cotton, support bra. Do not get a nursing bra until the last month of the pregnancy if you will be nursing the baby.   You may get a dark line from your belly button to the pubic area called the linea nigra.   You may develop rosy cheeks because of increase blood flow to the face.   You may develop spider looking lines of the face, neck, arms and chest. These go away after the baby is born.  HOME CARE INSTRUCTIONS   It is extremely important to avoid all smoking, herbs, alcohol, and unprescribed drugs during your pregnancy. These chemicals affect the formation and growth of the baby. Avoid these chemicals throughout the pregnancy to ensure the delivery of a healthy infant.   Most of your home  care instructions are the same as suggested for the first trimester of your pregnancy. Keep your caregiver's appointments. Follow your caregiver's instructions regarding medication use, exercise and diet.   During pregnancy, you are providing food for you and your baby. Continue to eat regular, well-balanced meals. Choose foods such as meat, fish, milk and other low fat dairy products, vegetables, fruits, and whole-grain breads and cereals. Your caregiver will tell you of the ideal weight gain.   A physical sexual relationship may be continued up until near the end of pregnancy if there are no other problems. Problems could include early (premature) leaking of amniotic fluid from the membranes, vaginal bleeding, abdominal pain, or other medical or pregnancy problems.   Exercise regularly if there are no restrictions. Check with your caregiver if you are unsure of the safety of some of your exercises. The greatest weight gain will occur in the   last 2 trimesters of pregnancy. Exercise will help you:   Control your weight.   Get you in shape for labor and delivery.   Lose weight after you have the baby.   Wear a good support or jogging bra for breast tenderness during pregnancy. This may help if worn during sleep. Pads or tissues may be used in the bra if you are leaking colostrum.   Do not use hot tubs, steam rooms or saunas throughout the pregnancy.   Wear your seat belt at all times when driving. This protects you and your baby if you are in an accident.   Avoid raw meat, uncooked cheese, cat litter boxes and soil used by cats. These carry germs that can cause birth defects in the baby.   The second trimester is also a good time to visit your dentist for your dental health if this has not been done yet. Getting your teeth cleaned is OK. Use a soft toothbrush. Brush gently during pregnancy.   It is easier to loose urine during pregnancy. Tightening up and strengthening the pelvic muscles will  help with this problem. Practice stopping your urination while you are going to the bathroom. These are the same muscles you need to strengthen. It is also the muscles you would use as if you were trying to stop from passing gas. You can practice tightening these muscles up 10 times a set and repeating this about 3 times per day. Once you know what muscles to tighten up, do not perform these exercises during urination. It is more likely to contribute to an infection by backing up the urine.   Ask for help if you have financial, counseling or nutritional needs during pregnancy. Your caregiver will be able to offer counseling for these needs as well as refer you for other special needs.   Your skin may become oily. If so, wash your face with mild soap, use non-greasy moisturizer and oil or cream based makeup.  MEDICATIONS AND DRUG USE IN PREGNANCY  Take prenatal vitamins as directed. The vitamin should contain 1 milligram of folic acid. Keep all vitamins out of reach of children. Only a couple vitamins or tablets containing iron may be fatal to a baby or young child when ingested.   Avoid use of all medications, including herbs, over-the-counter medications, not prescribed or suggested by your caregiver. Only take over-the-counter or prescription medicines for pain, discomfort, or fever as directed by your caregiver. Do not use aspirin.   Let your caregiver also know about herbs you may be using.   Alcohol is related to a number of birth defects. This includes fetal alcohol syndrome. All alcohol, in any form, should be avoided completely. Smoking will cause low birth rate and premature babies.   Street or illegal drugs are very harmful to the baby. They are absolutely forbidden. A baby born to an addicted mother will be addicted at birth. The baby will go through the same withdrawal an adult does.  SEEK MEDICAL CARE IF:  You have any concerns or worries during your pregnancy. It is better to call with  your questions if you feel they cannot wait, rather than worry about them. SEEK IMMEDIATE MEDICAL CARE IF:   An unexplained oral temperature above 102 F (38.9 C) develops, or as your caregiver suggests.   You have leaking of fluid from the vagina (birth canal). If leaking membranes are suspected, take your temperature and tell your caregiver of this when you call.   There   is vaginal spotting, bleeding, or passing clots. Tell your caregiver of the amount and how many pads are used. Light spotting in pregnancy is common, especially following intercourse.   You develop a bad smelling vaginal discharge with a change in the color from clear to white.   You continue to feel sick to your stomach (nauseated) and have no relief from remedies suggested. You vomit blood or coffee ground-like materials.   You lose more than 2 pounds of weight or gain more than 2 pounds of weight over 1 week, or as suggested by your caregiver.   You notice swelling of your face, hands, feet, or legs.   You get exposed to German measles and have never had them.   You are exposed to fifth disease or chickenpox.   You develop belly (abdominal) pain. Round ligament discomfort is a common non-cancerous (benign) cause of abdominal pain in pregnancy. Your caregiver still must evaluate you.   You develop a bad headache that does not go away.   You develop fever, diarrhea, pain with urination, or shortness of breath.   You develop visual problems, blurry, or double vision.   You fall or are in a car accident or any kind of trauma.   There is mental or physical violence at home.  Document Released: 07/13/2001 Document Revised: 03/31/2011 Document Reviewed: 01/15/2009 [employer] Patient Information 2012 [employer]. Birth Control Choices Birth control is the use of any practices, methods, or devices to prevent pregnancy from happening in a sexually active woman.  Below are some birth control choices to help avoid  pregnancy.  Not having sex (abstinence) is the surest form of birth control. This requires self-control. There is no risk of acquiring a sexually transmitted disease (STD), including acquired immunodeficiency syndrome (AIDS).   Periodic abstinence requires self-control during certain times of the month.   Calendar method, timing your menstrual periods from month to month.   Ovulation method is avoiding sexual intercourse around the time you produce an egg (ovulate).   Symptotherm method is avoiding sexual intercourse at the time of ovulation, using a thermometer and ovulation symptoms.   Post ovulation method is the timing of sexual intercourse after you ovulated.  These methods do not protect against STDs, including AIDS.  Birth control pills (BCPs) contain estrogen and progesterone hormone. These medicines work by stopping the egg from forming in the ovary (ovulation). Birth control pills are prescribed by a caregiver who will ask you questions about the risks of taking BCPs. Birth control pills do not protect against STDs, including AIDS.   "Minipill" birth control pills have only the progesterone hormone. They are taken every day of each month and must be prescribed by your caregiver. They do not protect against STDs, including AIDS.   Emergency contraception is often call the "morning after" pill. This pill can be taken right after sex or up to five days after sex if you think your birth control failed, you failed to use contraception, or you were forced to have sex. It is most effective the sooner you take the pills after having sexual intercourse. Do not use emergency contraception as your only form of birth control. Emergency contraceptive pills are available without a prescription. Check with your pharmacist.   Condoms are a thin sheath of latex, synthetic material, or lambskin worn over the penis during sexual intercourse. They can have a spermicide in or on them when you buy them.  Latex condoms can prevent pregnancy and STDs. "Natural" or lambskin   condoms can prevent pregnancy but may not protect against STDs, including AIDS.   Female condoms are a soft, loose-fitting sheath that is put into the vagina before sexual intercourse. They can prevent pregnancy and STDs, including AIDS.   Sponge is a soft, circular piece of polyurethane foam with spermicide in it that is inserted into the vagina after wetting it and before sexual intercourse. It does not require a prescription from your caregiver. It does not protect against STDs, including AIDS.   Diaphragm is a soft, latex, dome-shaped barrier that must be fitted by a caregiver. It is inserted into the vagina, along with a spermicidal jelly. After the proper fitting for a diaphragm, always insert the diaphragm before intercourse. The diaphragm should be left in the vagina for 6 to 8 hours after intercourse. Removal and reinsertion with a spermicide is always necessary after any use. It does not protect against STDs, including AIDS.   Progesterone-only injections are given every 3 months to prevent pregnancy. These injections contain synthetic progesterone and no estrogen. This hormone stops the ovaries from releasing eggs. It also causes the cervical mucus to thicken and changes the uterine lining. This makes it harder for sperm to survive in the uterus. It does not protect against STDs, including AIDS.   Birth Control Patch contains hormones similar to those in birth control pills, so effectiveness, risks, and side effects are similar. It must be changed once a week and is prescribed by a caregiver. It is less effective in very overweight women. It does not protect against STDs, including AIDS.   Vaginal Ring contains hormones similar to those in birth control pills. It is left in place for 3 weeks, removed for 1 week, and then a new one is put back into the vagina. It comes with a timer to put in your purse to help you remember when  to take it out or put a new one in. A caregiver's examination and prescription is necessary, just like with birth control pills and the patch. It does not protect against STDs, including AIDS.   Estrogen plus progesterone injections are given every 28 to 30 days. They can be given in the upper arm, thigh, or buttocks. It does not protect against STDs, including AIDS.   Intrauterine device (IUD): copper T or progestin filled is a T-shaped device that is put in a woman's uterus during a menstrual period to prevent pregnancy. The copper T IUD can last 10 years, and the progestin IUD can last 5 years. The progestin IUD can also help control heavy menstrual periods. It does not protect against STDs, including AIDS. The copper T IUD can be used as emergency contraception if inserted within 5 days of having unprotected intercourse.   Cervical cap is a round, soft latex or plastic cup that fits over the cervix and must be fitted by a caregiver. You do not need to use a spermicide with it or remove and insert it every time you have sexual intercourse. It does not protect against STDs, including AIDS.   Spermicides are chemicals that kill or block sperm from entering the cervix and uterus. They come in the form of creams, jellies, suppositories, foam, or tablets, and they do not require a prescription. They are inserted into the vagina with an applicator before having sexual intercourse. This must be repeated every time you have sexual intercourse.   Withdrawal is using the method of the female withdrawing his penis from sexual intercourse before he has a climax   and deposits his sperm. It does not protect against STDs, including AIDS.   Female tubal ligation is when the woman's fallopian tubes are surgically sealed or tied to prevent the egg from traveling to the uterus. It does not protect against STDs, including AIDS.   Female sterilization is when the female has his tubes that carry sperm tied off (vasectomy) to  stop sperm from entering the vagina during sexual intercourse. It does not protect against STDs, including AIDS.  Regardless of which method of birth control you choose, it is still important that you use some form of protection against STDs. Document Released: 07/19/2005 Document Revised: 08/21/2010 Document Reviewed: 06/05/2009 [employer] Patient Information 2012 [employer]. Breastfeeding BENEFITS OF BREASTFEEDING For the baby  The first milk (colostrum) helps the baby's digestive system function better.   There are antibodies from the mother in the milk that help the baby fight off infections.   The baby has a lower incidence of asthma, allergies, and SIDS (sudden infant death syndrome).   The nutrients in breast milk are better than formulas for the baby and helps the baby's brain grow better.   Babies who breastfeed have less gas, colic, and constipation.  For the mother  Breastfeeding helps develop a very special bond between mother and baby.   It is more convenient, always available at the correct temperature and cheaper than formula feeding.   It burns calories in the mother and helps with losing weight that was gained during pregnancy.   It makes the uterus contract back down to normal size faster and slows bleeding following delivery.   Breastfeeding mothers have a lower risk of developing breast cancer.  NURSE FREQUENTLY  A healthy, full-term baby may breastfeed as often as every hour or space his or her feedings to every 3 hours.   How often to nurse will vary from baby to baby. Watch your baby for signs of hunger, not the clock.   Nurse as often as the baby requests, or when you feel the need to reduce the fullness of your breasts.   Awaken the baby if it has been 3 to 4 hours since the last feeding.   Frequent feeding will help the mother make more milk and will prevent problems like sore nipples and engorgement of the breasts.  BABY'S POSITION AT THE  BREAST  Whether lying down or sitting, be sure that the baby's tummy is facing your tummy.   Support the breast with 4 fingers underneath the breast and the thumb above. Make sure your fingers are well away from the nipple and baby's mouth.   Stroke the baby's lips and cheek closest to the breast gently with your finger or nipple.   When the baby's mouth is open wide enough, place all of your nipple and as much of the dark area around the nipple as possible into your baby's mouth.   Pull the baby in close so the tip of the nose and the baby's cheeks touch the breast during the feeding.  FEEDINGS  The length of each feeding varies from baby to baby and from feeding to feeding.   The baby must suck about 2 to 3 minutes for your milk to get to him or her. This is called a "let down." For this reason, allow the baby to feed on each breast as long as he or she wants. Your baby will end the feeding when he or she has received the right balance of nutrients.   To   break the suction, put your finger into the corner of the baby's mouth and slide it between his or her gums before removing your breast from his or her mouth. This will help prevent sore nipples.  REDUCING BREAST ENGORGEMENT  In the first week after your baby is born, you may experience signs of breast engorgement. When breasts are engorged, they feel heavy, warm, full, and may be tender to the touch. You can reduce engorgement if you:   Nurse frequently, every 2 to 3 hours. Mothers who breastfeed early and often have fewer problems with engorgement.   Place light ice packs on your breasts between feedings. This reduces swelling. Wrap the ice packs in a lightweight towel to protect your skin.   Apply moist hot packs to your breast for 5 to 10 minutes before each feeding. This increases circulation and helps the milk flow.   Gently massage your breast before and during the feeding.   Make sure that the baby empties at least one breast  at every feeding before switching sides.   Use a breast pump to empty the breasts if your baby is sleepy or not nursing well. You may also want to pump if you are returning to work or or you feel you are getting engorged.   Avoid bottle feeds, pacifiers or supplemental feedings of water or juice in place of breastfeeding.   Be sure the baby is latched on and positioned properly while breastfeeding.   Prevent fatigue, stress, and anemia.   Wear a supportive bra, avoiding underwire styles.   Eat a balanced diet with enough fluids.  If you follow these suggestions, your engorgement should improve in 24 to 48 hours. If you are still experiencing difficulty, call your lactation consultant or caregiver. IS MY BABY GETTING ENOUGH MILK? Sometimes, mothers worry about whether their babies are getting enough milk. You can be assured that your baby is getting enough milk if:  The baby is actively sucking and you hear swallowing.   The baby nurses at least 8 to 12 times in a 24 hour time period. Nurse your baby until he or she unlatches or falls asleep at the first breast (at least 10 to 20 minutes), then offer the second side.   The baby is wetting 5 to 6 disposable diapers (6 to 8 cloth diapers) in a 24 hour period by 5 to 6 days of age.   The baby is having at least 2 to 3 stools every 24 hours for the first few months. Breast milk is all the food your baby needs. It is not necessary for your baby to have water or formula. In fact, to help your breasts make more milk, it is best not to give your baby supplemental feedings during the early weeks.   The stool should be soft and yellow.   The baby should gain 4 to 7 ounces per week after he is 4 days old.  TAKE CARE OF YOURSELF Take care of your breasts by:  Bathing or showering daily.   Avoiding the use of soaps on your nipples.   Start feedings on your left breast at one feeding and on your right breast at the next feeding.   You will  notice an increase in your milk supply 2 to 5 days after delivery. You may feel some discomfort from engorgement, which makes your breasts very firm and often tender. Engorgement "peaks" out within 24 to 48 hours. In the meantime, apply warm moist towels to your   breasts for 5 to 10 minutes before feeding. Gentle massage and expression of some milk before feeding will soften your breasts, making it easier for your baby to latch on. Wear a well fitting nursing bra and air dry your nipples for 10 to 15 minutes after each feeding.   Only use cotton bra pads.   Only use pure lanolin on your nipples after nursing. You do not need to wash it off before nursing.  Take care of yourself by:   Eating well-balanced meals and nutritious snacks.   Drinking milk, fruit juice, and water to satisfy your thirst (about 8 glasses a day).   Getting plenty of rest.   Increasing calcium in your diet (1200 mg a day).   Avoiding foods that you notice affect the baby in a bad way.  SEEK MEDICAL CARE IF:   You have any questions or difficulty with breastfeeding.   You need help.   You have a hard, red, sore area on your breast, accompanied by a fever of 100.5 F (38.1 C) or more.   Your baby is too sleepy to eat well or is having trouble sleeping.   Your baby is wetting less than 6 diapers per day, by 5 days of age.   Your baby's skin or white part of his or her eyes is more yellow than it was in the hospital.   You feel depressed.  Document Released: 07/19/2005 Document Revised: 03/31/2011 Document Reviewed: 03/03/2009 [employer] Patient Information 2012 [employer]. 

## 2011-08-24 LAB — CBC
HCT: 37.5 % (ref 36.0–46.0)
Hemoglobin: 12.1 g/dL (ref 12.0–15.0)
MCH: 25.2 pg — ABNORMAL LOW (ref 26.0–34.0)
MCV: 78.1 fL (ref 78.0–100.0)
RBC: 4.8 MIL/uL (ref 3.87–5.11)

## 2011-08-31 ENCOUNTER — Ambulatory Visit (HOSPITAL_COMMUNITY)
Admission: RE | Admit: 2011-08-31 | Discharge: 2011-08-31 | Disposition: A | Discharge status: Home / Self Care | Payer: Medicaid Other | Source: Ambulatory Visit | Attending: Family Medicine | Admitting: Family Medicine

## 2011-08-31 DIAGNOSIS — O09219 Supervision of pregnancy with history of pre-term labor, unspecified trimester: Secondary | ICD-10-CM | POA: Insufficient documentation

## 2011-08-31 DIAGNOSIS — IMO0002 Reserved for concepts with insufficient information to code with codable children: Secondary | ICD-10-CM

## 2011-08-31 DIAGNOSIS — Z3689 Encounter for other specified antenatal screening: Secondary | ICD-10-CM | POA: Insufficient documentation

## 2011-09-01 ENCOUNTER — Encounter: Payer: Self-pay | Admitting: Family Medicine

## 2011-09-02 ENCOUNTER — Ambulatory Visit (INDEPENDENT_AMBULATORY_CARE_PROVIDER_SITE_OTHER): Payer: Medicaid Other | Admitting: Obstetrics & Gynecology

## 2011-09-02 VITALS — BP 115/70 | Wt 174.0 lb

## 2011-09-02 DIAGNOSIS — Z3480 Encounter for supervision of other normal pregnancy, unspecified trimester: Secondary | ICD-10-CM

## 2011-09-02 DIAGNOSIS — IMO0002 Reserved for concepts with insufficient information to code with codable children: Secondary | ICD-10-CM

## 2011-09-02 NOTE — Progress Notes (Signed)
Here today to discuss U/S findings (08-31-11) 75% growth, normal fluid. No problems. Good FM.

## 2011-09-02 NOTE — Progress Notes (Signed)
1 hour glucola normal last visit. Routine visit today. No complaints.

## 2011-09-16 ENCOUNTER — Ambulatory Visit (INDEPENDENT_AMBULATORY_CARE_PROVIDER_SITE_OTHER): Payer: Medicaid Other | Admitting: Obstetrics & Gynecology

## 2011-09-16 DIAGNOSIS — B951 Streptococcus, group B, as the cause of diseases classified elsewhere: Secondary | ICD-10-CM

## 2011-09-16 DIAGNOSIS — O2390 Unspecified genitourinary tract infection in pregnancy, unspecified trimester: Secondary | ICD-10-CM

## 2011-09-16 DIAGNOSIS — O2340 Unspecified infection of urinary tract in pregnancy, unspecified trimester: Secondary | ICD-10-CM

## 2011-09-16 DIAGNOSIS — O228X9 Other venous complications in pregnancy, unspecified trimester: Secondary | ICD-10-CM

## 2011-09-16 DIAGNOSIS — IMO0002 Reserved for concepts with insufficient information to code with codable children: Secondary | ICD-10-CM

## 2011-09-16 DIAGNOSIS — Z3480 Encounter for supervision of other normal pregnancy, unspecified trimester: Secondary | ICD-10-CM

## 2011-09-16 DIAGNOSIS — N390 Urinary tract infection, site not specified: Secondary | ICD-10-CM

## 2011-09-16 DIAGNOSIS — B181 Chronic viral hepatitis B without delta-agent: Secondary | ICD-10-CM

## 2011-09-16 MED ORDER — HYDROCORTISONE ACETATE 25 MG RE SUPP: 25.0000 mg | Freq: Two times a day (BID) | RECTAL | Status: AC

## 2011-09-16 NOTE — Patient Instructions (Addendum)
Return to clinic for any obstetric concerns or go to [facility]U for evaluation Breastfeeding BENEFITS OF BREASTFEEDING For the baby  The first milk (colostrum) helps the baby's digestive system function better.   There are antibodies from the mother in the milk that help the baby fight off infections.   The baby has a lower incidence of asthma, allergies, and SIDS (sudden infant death syndrome).   The nutrients in breast milk are better than formulas for the baby and helps the baby's brain grow better.   Babies who breastfeed have less gas, colic, and constipation.  For the mother  Breastfeeding helps develop a very special bond between mother and baby.   It is more convenient, always available at the correct temperature and cheaper than formula feeding.   It burns calories in the mother and helps with losing weight that was gained during pregnancy.   It makes the uterus contract back down to normal size faster and slows bleeding following delivery.   Breastfeeding mothers have a lower risk of developing breast cancer.  NURSE FREQUENTLY  A healthy, full-term baby may breastfeed as often as every hour or space his or her feedings to every 3 hours.   How often to nurse will vary from baby to baby. Watch your baby for signs of hunger, not the clock.   Nurse as often as the baby requests, or when you feel the need to reduce the fullness of your breasts.   Awaken the baby if it has been 3 to 4 hours since the last feeding.   Frequent feeding will help the mother make more milk and will prevent problems like sore nipples and engorgement of the breasts.  BABY'S POSITION AT THE BREAST  Whether lying down or sitting, be sure that the baby's tummy is facing your tummy.   Support the breast with 4 fingers underneath the breast and the thumb above. Make sure your fingers are well away from the nipple and baby's mouth.   Stroke the baby's lips and cheek closest to the breast gently with your  finger or nipple.   When the baby's mouth is open wide enough, place all of your nipple and as much of the dark area around the nipple as possible into your baby's mouth.   Pull the baby in close so the tip of the nose and the baby's cheeks touch the breast during the feeding.  FEEDINGS  The length of each feeding varies from baby to baby and from feeding to feeding.   The baby must suck about 2 to 3 minutes for your milk to get to him or her. This is called a "let down." For this reason, allow the baby to feed on each breast as long as he or she wants. Your baby will end the feeding when he or she has received the right balance of nutrients.   To break the suction, put your finger into the corner of the baby's mouth and slide it between his or her gums before removing your breast from his or her mouth. This will help prevent sore nipples.  REDUCING BREAST ENGORGEMENT  In the first week after your baby is born, you may experience signs of breast engorgement. When breasts are engorged, they feel heavy, warm, full, and may be tender to the touch. You can reduce engorgement if you:   Nurse frequently, every 2 to 3 hours. Mothers who breastfeed early and often have fewer problems with engorgement.   Place light ice packs on   your breasts between feedings. This reduces swelling. Wrap the ice packs in a lightweight towel to protect your skin.   Apply moist hot packs to your breast for 5 to 10 minutes before each feeding. This increases circulation and helps the milk flow.   Gently massage your breast before and during the feeding.   Make sure that the baby empties at least one breast at every feeding before switching sides.   Use a breast pump to empty the breasts if your baby is sleepy or not nursing well. You may also want to pump if you are returning to work or or you feel you are getting engorged.   Avoid bottle feeds, pacifiers or supplemental feedings of water or juice in place of  breastfeeding.   Be sure the baby is latched on and positioned properly while breastfeeding.   Prevent fatigue, stress, and anemia.   Wear a supportive bra, avoiding underwire styles.   Eat a balanced diet with enough fluids.  If you follow these suggestions, your engorgement should improve in 24 to 48 hours. If you are still experiencing difficulty, call your lactation consultant or caregiver. IS MY BABY GETTING ENOUGH MILK? Sometimes, mothers worry about whether their babies are getting enough milk. You can be assured that your baby is getting enough milk if:  The baby is actively sucking and you hear swallowing.   The baby nurses at least 8 to 12 times in a 24 hour time period. Nurse your baby until he or she unlatches or falls asleep at the first breast (at least 10 to 20 minutes), then offer the second side.   The baby is wetting 5 to 6 disposable diapers (6 to 8 cloth diapers) in a 24 hour period by 5 to 6 days of age.   The baby is having at least 2 to 3 stools every 24 hours for the first few months. Breast milk is all the food your baby needs. It is not necessary for your baby to have water or formula. In fact, to help your breasts make more milk, it is best not to give your baby supplemental feedings during the early weeks.   The stool should be soft and yellow.   The baby should gain 4 to 7 ounces per week after he is 4 days old.  TAKE CARE OF YOURSELF Take care of your breasts by:  Bathing or showering daily.   Avoiding the use of soaps on your nipples.   Start feedings on your left breast at one feeding and on your right breast at the next feeding.   You will notice an increase in your milk supply 2 to 5 days after delivery. You may feel some discomfort from engorgement, which makes your breasts very firm and often tender. Engorgement "peaks" out within 24 to 48 hours. In the meantime, apply warm moist towels to your breasts for 5 to 10 minutes before feeding. Gentle  massage and expression of some milk before feeding will soften your breasts, making it easier for your baby to latch on. Wear a well fitting nursing bra and air dry your nipples for 10 to 15 minutes after each feeding.   Only use cotton bra pads.   Only use pure lanolin on your nipples after nursing. You do not need to wash it off before nursing.  Take care of yourself by:   Eating well-balanced meals and nutritious snacks.   Drinking milk, fruit juice, and water to satisfy your thirst (about 8 glasses   glasses a day).   Getting plenty of rest.   Increasing calcium in your diet (1200 mg a day).   Avoiding foods that you notice affect the baby in a bad way.  SEEK MEDICAL CARE IF:   You have any questions or difficulty with breastfeeding.   You need help.   You have a hard, red, sore area on your breast, accompanied by a fever of 100.5 F (38.1 C) or more.   Your baby is too sleepy to eat well or is having trouble sleeping.   Your baby is wetting less than 6 diapers per day, by 51 days of age.   Your baby's skin or white part of his or her eyes is more yellow than it was in the hospital.   You feel depressed.  Document Released: 07/19/2005 Document Revised: 03/31/2011 Document Reviewed: 03/03/2009 [facility] Patient Information 2012 [place], [place].

## 2011-09-16 NOTE — Progress Notes (Signed)
No complaints or concerns. Plans to breast feed, undecided about BCM.   Fetal movement and labor precautions reviewed.

## 2011-10-05 ENCOUNTER — Ambulatory Visit (INDEPENDENT_AMBULATORY_CARE_PROVIDER_SITE_OTHER): Payer: Medicaid Other | Admitting: Obstetrics and Gynecology

## 2011-10-05 DIAGNOSIS — B181 Chronic viral hepatitis B without delta-agent: Secondary | ICD-10-CM

## 2011-10-05 DIAGNOSIS — IMO0002 Reserved for concepts with insufficient information to code with codable children: Secondary | ICD-10-CM

## 2011-10-05 DIAGNOSIS — Z3480 Encounter for supervision of other normal pregnancy, unspecified trimester: Secondary | ICD-10-CM

## 2011-10-05 NOTE — Progress Notes (Signed)
Having increased leg cramps.  She sneezed this morning and felt a pop in her pelvic area.

## 2011-10-05 NOTE — Progress Notes (Signed)
Patient doing well without complaints. FM/PTL precautions reviewed. Patient remains undecided on birth control method

## 2011-10-18 ENCOUNTER — Ambulatory Visit (INDEPENDENT_AMBULATORY_CARE_PROVIDER_SITE_OTHER): Payer: Medicaid Other | Admitting: Obstetrics & Gynecology

## 2011-10-18 DIAGNOSIS — Z3480 Encounter for supervision of other normal pregnancy, unspecified trimester: Secondary | ICD-10-CM

## 2011-10-18 NOTE — Progress Notes (Signed)
Routine visit. Complains of some pelvic pressure. Cervical exam as above. Labor precautions. She reports good FM, denies VB or ROM.

## 2011-10-26 ENCOUNTER — Encounter: Payer: Medicaid Other | Admitting: Obstetrics and Gynecology

## 2011-10-26 ENCOUNTER — Ambulatory Visit (INDEPENDENT_AMBULATORY_CARE_PROVIDER_SITE_OTHER): Payer: Medicaid Other | Admitting: Obstetrics and Gynecology

## 2011-10-26 VITALS — BP 111/76 | Wt 180.0 lb

## 2011-10-26 DIAGNOSIS — O2390 Unspecified genitourinary tract infection in pregnancy, unspecified trimester: Secondary | ICD-10-CM

## 2011-10-26 DIAGNOSIS — B181 Chronic viral hepatitis B without delta-agent: Secondary | ICD-10-CM

## 2011-10-26 DIAGNOSIS — R87610 Atypical squamous cells of undetermined significance on cytologic smear of cervix (ASC-US): Secondary | ICD-10-CM

## 2011-10-26 DIAGNOSIS — IMO0001 Reserved for inherently not codable concepts without codable children: Secondary | ICD-10-CM

## 2011-10-26 DIAGNOSIS — B951 Streptococcus, group B, as the cause of diseases classified elsewhere: Secondary | ICD-10-CM

## 2011-10-26 DIAGNOSIS — Z3480 Encounter for supervision of other normal pregnancy, unspecified trimester: Secondary | ICD-10-CM

## 2011-10-26 DIAGNOSIS — N390 Urinary tract infection, site not specified: Secondary | ICD-10-CM

## 2011-10-26 DIAGNOSIS — IMO0002 Reserved for concepts with insufficient information to code with codable children: Secondary | ICD-10-CM

## 2011-10-26 NOTE — Progress Notes (Signed)
Patient doing well without complaints. Cultures next visit-patient had GBS bacteruria. FM/PTL precautions reviewed. Patient remains undecided on [facility]

## 2011-10-26 NOTE — Progress Notes (Signed)
Patient is here for routine prenatal check.  She is doing well. 

## 2011-10-27 ENCOUNTER — Encounter: Payer: Medicaid Other | Admitting: Obstetrics & Gynecology

## 2011-11-03 ENCOUNTER — Ambulatory Visit (INDEPENDENT_AMBULATORY_CARE_PROVIDER_SITE_OTHER): Payer: Medicaid Other | Admitting: Obstetrics & Gynecology

## 2011-11-03 VITALS — BP 104/68 | Wt 186.0 lb

## 2011-11-03 DIAGNOSIS — Z3480 Encounter for supervision of other normal pregnancy, unspecified trimester: Secondary | ICD-10-CM

## 2011-11-03 DIAGNOSIS — O2390 Unspecified genitourinary tract infection in pregnancy, unspecified trimester: Secondary | ICD-10-CM

## 2011-11-03 DIAGNOSIS — B951 Streptococcus, group B, as the cause of diseases classified elsewhere: Secondary | ICD-10-CM

## 2011-11-03 DIAGNOSIS — N390 Urinary tract infection, site not specified: Secondary | ICD-10-CM

## 2011-11-03 DIAGNOSIS — IMO0002 Reserved for concepts with insufficient information to code with codable children: Secondary | ICD-10-CM

## 2011-11-03 NOTE — Patient Instructions (Signed)
Group B Strep During Pregnancy Group B strep (GBS) is a type of bacteria often found in healthy women. GBS usually does not cause any symptoms or harm to healthy adult women, but the bacteria can make a baby very sick if it is passed to the baby during childbirth. GBS is not a sexually transmitted disease (STD). GBS is different from Group A strep, the bacteria that causes "strep throat." CAUSES  GBS bacteria can be found in the intestinal, reproductive, and urinary tracts of women. It can also be found in the female genital tract, most often in the vagina and rectal areas.  SYMPTOMS  In pregnancy, GBS can be in the following places:  Genital tract without symptoms.   Rectum without symptoms.   Urine with or without symptoms (asymptomatic bacteriuria).   Urinary symptoms can include pain, frequency, urgency, and blood with urination (cystitis).  Pregnant women who are infected with GBS are at increased risk of:  Early (premature) labor and delivery.   Prolonged rupture of the membranes.   Infection in the following places:   Bladder.   Kidneys (pyelonephritis).   Bag of waters or placenta (chorioamnionitis).   Uterus (endometritis) after delivery.  Newborns who are infected with GBS can develop:  Lung infection (pneumonia).   Blood infection (septicemia).   Infection of the lining of the brain and spinal cord (meningitis).  DIAGNOSIS  Diagnosis of GBS infection is made by screening tests done when you are 35 to [redacted] weeks pregnant. The test (culture) is an easy swab of the vagina and rectum. A sample of your urine might also be checked for the bacteria. Talk with your caregiver about a plan for labor if your test shows that you carry the GBS bacteria. TREATMENT  If results of the culture are positive, showing that GBS is present, you likely will receive treatment with antibiotic medicines during labor. This will help prevent GBS from being passed to your baby. The antibiotics  work only if they are given during labor. If treatment is given earlier in pregnancy, the bacteria may regrow and be present during labor. Tell your caregiver if you are allergic to penicillin or other antibiotics. Antibiotics are also given if:   Infection of the membranes (amnionitis) is suspected.   Labor begins or there is rupture of the membranes before 37 weeks of pregnancy and there is a high risk of delivering the baby.   The mother has a past history of giving birth to an infant with GBS infection.   The culture status is unknown (culture not performed or result not available) and there is:   Fever during labor.   Preterm labor (less than 37 weeks of pregnancy).   Prolonged rupture of membranes (18 hours or more).  Treatment of the mother during labor is not recommended when:  A planned cesarean delivery is done and there is no labor or ruptured membranes. This is true even if the mother tested positive for GBS.   There is a negative culture for GBS screening during the pregnancy, regardless of the risk factors during labor.  The infant will receive antibiotics if the infant tested positive for GBS or has signs and symptoms that suggest GBS infection is present. It is not recommended to routinely give antibiotics to infants whose mothers received antibiotic treatment during labor. HOME CARE INSTRUCTIONS   Take all antibiotics as prescribed by your caregiver.   Only take medicine as directed by your caregiver.   Continue with prenatal visits and   care.   Return for follow-up appointments and cultures.   Follow your caregiver's instructions.  SEEK MEDICAL CARE IF:   You have pain with urination.   You have frequent urination.   You have blood in your urine.  SEEK IMMEDIATE MEDICAL CARE IF:  You have a fever.   You have pain in the back, side, or uterus.   You have chills.   You have abdominal swelling (distension) or pain.   You have labor pains (contractions)  every 10 minutes or more often.   You are leaking fluid or bleeding from your vagina.   You have pelvic pressure that feels like your baby is pushing down.   You have a low, dull backache.   You have cramps that feel like your period.   You have abdominal cramps with or without diarrhea.   You have repeated vomiting and diarrhea.   You have trouble breathing.   You develop confusion.   You have stiffness of your body or neck.  MAKE SURE YOU:   Understand these instructions.   Will watch your condition.   Will get help right away if you are not doing well or get worse.  Document Released: 10/26/2007 Document Revised: 07/08/2011 Document Reviewed: 11/29/2010 [employer] Patient Information 2012 [employer]. 

## 2011-11-03 NOTE — Progress Notes (Signed)
No complaints or concerns.  GC/Chlam done today; patient already GBS+ on urine culture.  Discussed implications of being GBS+ and need to treat in labor.   Fetal movement and labor precautions reviewed. Cervix 3/60/-2/soft/mid, vertex clearly palpated on exam.

## 2011-11-04 LAB — GC/CHLAMYDIA PROBE AMP, GENITAL
Chlamydia, DNA Probe: NEGATIVE
GC Probe Amp, Genital: NEGATIVE

## 2011-11-08 ENCOUNTER — Encounter (HOSPITAL_COMMUNITY): Payer: Self-pay | Admitting: *Deleted

## 2011-11-08 ENCOUNTER — Inpatient Hospital Stay (HOSPITAL_COMMUNITY)
Admission: AD | Admit: 2011-11-08 | Discharge: 2011-11-08 | Disposition: A | Discharge status: Home / Self Care | Payer: Medicaid Other | Source: Ambulatory Visit | Attending: Obstetrics and Gynecology | Admitting: Obstetrics and Gynecology

## 2011-11-08 DIAGNOSIS — O99891 Other specified diseases and conditions complicating pregnancy: Secondary | ICD-10-CM | POA: Insufficient documentation

## 2011-11-08 DIAGNOSIS — Z0371 Encounter for suspected problem with amniotic cavity and membrane ruled out: Secondary | ICD-10-CM

## 2011-11-08 DIAGNOSIS — N949 Unspecified condition associated with female genital organs and menstrual cycle: Secondary | ICD-10-CM | POA: Insufficient documentation

## 2011-11-08 LAB — POCT FERN TEST: Fern Test: NEGATIVE

## 2011-11-08 LAB — WET PREP, GENITAL: Yeast Wet Prep HPF POC: NONE SEEN

## 2011-11-08 NOTE — MAU Provider Note (Signed)
History     CSN: [id number]  Arrival date and time: 11/08/11 1232   First Provider Initiated Contact with Patient 11/08/11 1312      Chief Complaint  Patient presents with  . Vaginal Discharge   21 here at 66w3days here with complaint of leaking fluid from vagina.  States she woke up this morning around 10am with clear vaginal discharge.  Has had intermittent clear discharge since that time.  Denies large gush of fluid, vaginal bleeding, dysuria, nausea.  She is having infrequent mild contractions.  Has had good fetal movement.    OB History    Grav Para Term Preterm Abortions TAB SAB Ect Mult Living   5 2 1 1 2 2  0 0 0 2      Past Medical History  Diagnosis Date  . Anemia   . Preterm labor     Past Surgical History  Procedure Date  . Dilation and curettage of uterus     Family History  Problem Relation Age of Onset  . Hearing loss Brother   . Asthma Son     History  Substance Use Topics  . Smoking status: Former Smoker -- 0.0 packs/day    Types: Cigarettes  . Smokeless tobacco: Never Used   Comment: pt stated she is trying to quite down to 1 ciggarette/day  . Alcohol Use: No    Allergies: No Known Allergies  Prescriptions prior to admission  Medication Sig Dispense Refill  . Prenatal Vit-Fe Fumarate-FA (PRENATAL MULTIVITAMIN) TABS Take 1 tablet by mouth at bedtime.        Review of Systems  Eyes: Negative for blurred vision.  Respiratory: Negative for shortness of breath.   Cardiovascular: Negative for chest pain.   Physical Exam   Blood pressure 131/72, pulse 97, temperature 98.8 F (37.1 C), temperature source Oral, resp. rate 18, height 5\' 7"  (1.702 m), weight 83.689 kg (184 lb 8 oz), last menstrual period 02/19/2011.  Physical Exam  Constitutional: She appears well-developed and well-nourished. No distress.  Cardiovascular: Normal rate and regular rhythm.   Genitourinary:       Vagina normal without discharge.  There is minimal  pooling in the posterior vaginal vault with large amount of what appears to be cervical mucus.  Wet prep, gc/chlamydia, ferning slide, amnisure collected.  Dilation: 3 Effacement (%): 60 Exam by:: Dr. Ashley Royalty   NST:  Baseline 145 BPM, Accels: Present, Decels: absent, Variability: Moderate Cat I tracing  UC: Infrequent q 5-32min  MAU Course  Procedures  MDM Results for orders placed during the hospital encounter of 11/08/11 (from the past 24 hour(s))  WET PREP, GENITAL     Status: Abnormal   Collection Time   11/08/11  1:26 PM      Component Value Range   Yeast Wet Prep HPF POC NONE SEEN  NONE SEEN    Trich, Wet Prep NONE SEEN  NONE SEEN    Clue Cells Wet Prep HPF POC NONE SEEN  NONE SEEN    WBC, Wet Prep HPF POC FEW (*) NONE SEEN   AMNISURE RUPTURE OF MEMBRANE (ROM)     Status: Normal   Collection Time   11/08/11  1:26 PM      Component Value Range   Amnisure ROM NEGATIVE    POCT FERN TEST     Status: Normal   Collection Time   11/08/11  1:35 PM      Component Value Range   Fern Test Negative  Assessment and Plan  1.  Leaking of fluid  -Minimal pooling with negative ferning and amnisure.  Very low suspicion for ROM.  Wet prep normal.  Likely just normal leukorrhea of pregnancy vs. Urine leakage.  Labor precautions reviewed. GC/Chlamydia pending.   Nicollette Wilhelmi 11/08/2011, 1:34 PM

## 2011-11-08 NOTE — Discharge Instructions (Signed)
Normal Labor and Delivery Your caregiver must first be sure you are in labor. Signs of labor include:  You may pass what is called "the mucus plug" before labor begins. This is a small amount of blood stained mucus.   Regular uterine contractions.   The time between contractions get closer together.   The discomfort and pain gradually gets more intense.   Pains are mostly located in the back.   Pains get worse when walking.   The cervix (the opening of the uterus becomes thinner (begins to efface) and opens up (dilates).  Once you are in labor and admitted into the hospital or care center, your caregiver will do the following:  A complete physical examination.   Check your vital signs (blood pressure, pulse, temperature and the fetal heart rate).   Do a vaginal examination (using a sterile glove and lubricant) to determine:   The position (presentation) of the baby (head [vertex] or buttock first).   The level (station) of the baby's head in the birth canal.   The effacement and dilatation of the cervix.   You may have your pubic hair shaved and be given an enema depending on your caregiver and the circumstance.   An electronic monitor is usually placed on your abdomen. The monitor follows the length and intensity of the contractions, as well as the baby's heart rate.   Usually, your caregiver will insert an IV in your arm with a bottle of sugar water. This is done as a precaution so that medications can be given to you quickly during labor or delivery.  NORMAL LABOR AND DELIVERY IS DIVIDED UP INTO 3 STAGES: First Stage This is when regular contractions begin and the cervix begins to efface and dilate. This stage can last from 3 to 15 hours. The end of the first stage is when the cervix is 100% effaced and 10 centimeters dilated. Pain medications may be given by   Injection (morphine, demerol, etc.)   Regional anesthesia (spinal, caudal or epidural, anesthetics given in  different locations of the spine). Paracervical pain medication may be given, which is an injection of and anesthetic on each side of the cervix.  A pregnant woman may request to have "Natural Childbirth" which is not to have any medications or anesthesia during her labor and delivery. Second Stage This is when the baby comes down through the birth canal (vagina) and is born. This can take 1 to 4 hours. As the baby's head comes down through the birth canal, you may feel like you are going to have a bowel movement. You will get the urge to bear down and push until the baby is delivered. As the baby's head is being delivered, the caregiver will decide if an episiotomy (a cut in the perineum and vagina area) is needed to prevent tearing of the tissue in this area. The episiotomy is sewn up after the delivery of the baby and placenta. Sometimes a mask with nitrous oxide is given for the mother to breath during the delivery of the baby to help if there is too much pain. The end of Stage 2 is when the baby is fully delivered. Then when the umbilical cord stops pulsating it is clamped and cut. Third Stage The third stage begins after the baby is completely delivered and ends after the placenta (afterbirth) is delivered. This usually takes 5 to 30 minutes. After the placenta is delivered, a medication is given either by intravenous or injection to help contract   the uterus and prevent bleeding. The third stage is not painful and pain medication is usually not necessary. If an episiotomy was done, it is repaired at this time. After the delivery, the mother is watched and monitored closely for 1 to 2 hours to make sure there is no postpartum bleeding (hemorrhage). If there is a lot of bleeding, medication is given to contract the uterus and stop the bleeding. Document Released: 04/27/2008 Document Revised: 07/08/2011 Document Reviewed: 04/27/2008 [employer] Patient Information 2012 [employer]. 

## 2011-11-08 NOTE — MAU Note (Signed)
Patient is here with c/o watery vaginal discharge that she felt once this morning. She does not have any pad or pantiliner at this time. Denies contractions or bleeding. Reports good fetal movement. She states that she was told that she has marginal cord insertion.

## 2011-11-09 LAB — GC/CHLAMYDIA PROBE AMP, GENITAL: GC Probe Amp, Genital: NEGATIVE

## 2011-11-09 NOTE — MAU Provider Note (Signed)
Agree with above note.  Stacey Carrillo 11/09/2011 7:15 AM

## 2011-11-10 ENCOUNTER — Ambulatory Visit (INDEPENDENT_AMBULATORY_CARE_PROVIDER_SITE_OTHER): Payer: Medicaid Other | Admitting: Obstetrics and Gynecology

## 2011-11-10 VITALS — BP 112/70 | Wt 188.0 lb

## 2011-11-10 DIAGNOSIS — B951 Streptococcus, group B, as the cause of diseases classified elsewhere: Secondary | ICD-10-CM

## 2011-11-10 DIAGNOSIS — IMO0002 Reserved for concepts with insufficient information to code with codable children: Secondary | ICD-10-CM

## 2011-11-10 DIAGNOSIS — Z3480 Encounter for supervision of other normal pregnancy, unspecified trimester: Secondary | ICD-10-CM

## 2011-11-10 DIAGNOSIS — B181 Chronic viral hepatitis B without delta-agent: Secondary | ICD-10-CM

## 2011-11-10 DIAGNOSIS — O2390 Unspecified genitourinary tract infection in pregnancy, unspecified trimester: Secondary | ICD-10-CM

## 2011-11-10 DIAGNOSIS — R87610 Atypical squamous cells of undetermined significance on cytologic smear of cervix (ASC-US): Secondary | ICD-10-CM

## 2011-11-10 DIAGNOSIS — N390 Urinary tract infection, site not specified: Secondary | ICD-10-CM

## 2011-11-10 NOTE — Progress Notes (Signed)
Patient is here for routine prenatal check.  Having increase in contractions today.  She did go to MAU recently because she thought her water had broken, but everything was ok and she came back home.

## 2011-11-10 NOTE — Progress Notes (Signed)
Patient doing well. Cervix unchanged from last week. FM/Labor precautions reviewed.

## 2011-11-14 ENCOUNTER — Inpatient Hospital Stay (HOSPITAL_COMMUNITY)
Admission: AD | Admit: 2011-11-14 | Discharge: 2011-11-15 | DRG: 780 | Disposition: A | Discharge status: Home / Self Care | Payer: Medicaid Other | Source: Ambulatory Visit | Attending: Obstetrics & Gynecology | Admitting: Obstetrics & Gynecology

## 2011-11-14 ENCOUNTER — Encounter (HOSPITAL_COMMUNITY): Payer: Self-pay | Admitting: *Deleted

## 2011-11-14 DIAGNOSIS — Z22330 Carrier of Group B streptococcus: Secondary | ICD-10-CM

## 2011-11-14 DIAGNOSIS — IMO0001 Reserved for inherently not codable concepts without codable children: Secondary | ICD-10-CM

## 2011-11-14 DIAGNOSIS — O471 False labor at or after 37 completed weeks of gestation: Secondary | ICD-10-CM

## 2011-11-14 DIAGNOSIS — O99891 Other specified diseases and conditions complicating pregnancy: Secondary | ICD-10-CM | POA: Diagnosis present

## 2011-11-14 DIAGNOSIS — O479 False labor, unspecified: Principal | ICD-10-CM | POA: Diagnosis present

## 2011-11-14 LAB — URINALYSIS, ROUTINE W REFLEX MICROSCOPIC
Glucose, UA: NEGATIVE mg/dL
Leukocytes, UA: NEGATIVE
Protein, ur: NEGATIVE mg/dL
Specific Gravity, Urine: 1.02 (ref 1.005–1.030)

## 2011-11-14 LAB — CBC
Hemoglobin: 13.5 g/dL (ref 12.0–15.0)
MCH: 25.7 pg — ABNORMAL LOW (ref 26.0–34.0)
RBC: 5.25 MIL/uL — ABNORMAL HIGH (ref 3.87–5.11)
WBC: 11 10*3/uL — ABNORMAL HIGH (ref 4.0–10.5)

## 2011-11-14 MED ORDER — LACTATED RINGERS IV SOLN: 500.0000 mL | INTRAVENOUS | Status: DC | PRN

## 2011-11-14 MED ORDER — OXYCODONE-ACETAMINOPHEN 5-325 MG PO TABS: 1.0000 | ORAL_TABLET | ORAL | Status: DC | PRN

## 2011-11-14 MED ORDER — OXYTOCIN 20 UNITS IN LACTATED RINGERS INFUSION - SIMPLE: 125.0000 mL/h | Freq: Once | INTRAVENOUS | Status: DC

## 2011-11-14 MED ORDER — FLEET ENEMA 7-19 GM/118ML RE ENEM: 1.0000 | ENEMA | RECTAL | Status: DC | PRN

## 2011-11-14 MED ORDER — PENICILLIN G POTASSIUM 5000000 UNITS IJ SOLR
2.5000 10*6.[IU] | INTRAMUSCULAR | Status: DC
  Filled 2011-11-14 (×4): qty 2.5

## 2011-11-14 MED ORDER — ONDANSETRON HCL 4 MG/2ML IJ SOLN: 4.0000 mg | Freq: Four times a day (QID) | INTRAMUSCULAR | Status: DC | PRN

## 2011-11-14 MED ORDER — PENICILLIN G POTASSIUM 5000000 UNITS IJ SOLR
5.0000 10*6.[IU] | Freq: Once | INTRAVENOUS | Status: AC
  Administered 2011-11-14: 5 10*6.[IU] via INTRAVENOUS
  Filled 2011-11-14: qty 5

## 2011-11-14 MED ORDER — ACETAMINOPHEN 325 MG PO TABS: 650.0000 mg | ORAL_TABLET | ORAL | Status: DC | PRN

## 2011-11-14 MED ORDER — LIDOCAINE HCL (PF) 1 % IJ SOLN: 30.0000 mL | INTRAMUSCULAR | Status: DC | PRN

## 2011-11-14 MED ORDER — IBUPROFEN 600 MG PO TABS: 600.0000 mg | ORAL_TABLET | Freq: Four times a day (QID) | ORAL | Status: DC | PRN

## 2011-11-14 MED ORDER — LACTATED RINGERS IV SOLN
INTRAVENOUS | Status: DC
  Administered 2011-11-14: 21:00:00 via INTRAVENOUS

## 2011-11-14 MED ORDER — CITRIC ACID-SODIUM CITRATE 334-500 MG/5ML PO SOLN: 30.0000 mL | ORAL | Status: DC | PRN

## 2011-11-14 MED ORDER — OXYTOCIN BOLUS FROM INFUSION
500.0000 mL | Freq: Once | INTRAVENOUS | Status: DC
  Filled 2011-11-14: qty 500

## 2011-11-14 NOTE — H&P (Signed)
21 yo [id number] here at 38.2 weeks with complaint of contractions.  She states that she has been feeling contractions off and on since earlier today.  Has become stronger throughout the day.  Contractions are mild to moderate in intensity, lasting 45-60 seconds.  She has felt good fetal movement.  She denies any vaginal bleeding, discharge or leaking of fluid Maternal Medical History:  Reason for admission: Reason for admission: contractions.  Reason for Admission:   nauseaContractions: Onset was 13-24 hours ago.   Frequency: irregular.   Perceived severity is moderate.    Fetal activity: Perceived fetal activity is normal.   Last perceived fetal movement was within the past hour.    Prenatal complications: No bleeding, hypertension or substance abuse.     OB History    Grav Para Term Preterm Abortions TAB SAB Ect Mult Living   5 2 1 1 2 2  0 0 0 2     Past Medical History  Diagnosis Date  . Anemia   . Preterm labor    Past Surgical History  Procedure Date  . Dilation and curettage of uterus    Family History: family history includes Asthma in her son and Hearing loss in her brother. Social History:  reports that she has quit smoking. Her smoking use included Cigarettes. She smoked 0 packs per day. She has never used smokeless tobacco. She reports that she does not drink alcohol or use illicit drugs.  Review of Systems  Constitutional: Negative for fever.  Eyes: Negative for blurred vision.  Respiratory: Negative for shortness of breath.   Cardiovascular: Negative for chest pain.  Gastrointestinal: Negative for nausea and vomiting.  Genitourinary: Negative for dysuria.  Neurological: Negative for headaches.    Dilation: 4 Effacement (%): 70 Station: -2 Exam by:: Dr. Edilia Carrillo Last menstrual period 02/19/2011. Maternal Exam:  Uterine Assessment: Contraction strength is moderate.  Contraction frequency is irregular.   Abdomen: Estimated fetal weight is 7.5 lbs.   Fetal  presentation: vertex  Pelvis: adequate for delivery.      Fetal Exam Fetal Monitor Review: Baseline rate: 170.  Variability: moderate (6-25 bpm).   Pattern: accelerations present and no decelerations.    Fetal State Assessment: Category I - tracings are normal.     Physical Exam  Constitutional: She is oriented to person, place, and time. She appears well-nourished. No distress.  HENT:  Mouth/Throat: Oropharynx is clear and moist.  Neck: Neck supple.  Cardiovascular: Normal rate and regular rhythm.   Respiratory: Effort normal and breath sounds normal.  GI: She exhibits distension (gravid). There is no tenderness.  Musculoskeletal: She exhibits no edema.  Neurological: She is alert and oriented to person, place, and time.   Prenatal labs: ABO, Rh: A/POS/-- (10/08 1054) Antibody: NEG (10/08 1054) Rubella: 50.7 (10/08 1054) RPR: NON REAC (01/21 1617)  HBsAg: POSITIVE (10/08 1054)  HIV: NON REACTIVE (10/08 1054)  GBS:   Positive  Assessment/Plan: 1. Labor   -Expectant management 2. 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

## 2011-11-14 NOTE — MAU Provider Note (Signed)
  History     CSN: [id number]  Arrival date and time: 11/14/11 5284   First Provider Initiated Contact with Patient 11/14/11 1904      No chief complaint on file.  HPI 21 yo [id number] here at 38.2 weeks with complaint of contractions.  She states that she has been feeling contractions off and on since earlier today.  Has become stronger throughout the day.  Contractions are mild to moderate in intensity, lasting 45-60 seconds.  She has felt good fetal movement.  She denies any vaginal bleeding, discharge or leaking of fluid    OB History    Grav Para Term Preterm Abortions TAB SAB Ect Mult Living   5 2 1 1 2 2  0 0 0 2      Past Medical History  Diagnosis Date  . Anemia   . Preterm labor     Past Surgical History  Procedure Date  . Dilation and curettage of uterus     Family History  Problem Relation Age of Onset  . Hearing loss Brother   . Asthma Son     History  Substance Use Topics  . Smoking status: Former Smoker -- 0.0 packs/day    Types: Cigarettes  . Smokeless tobacco: Never Used   Comment: pt stated she is trying to quite down to 1 ciggarette/day  . Alcohol Use: No    Allergies: No Known Allergies  Prescriptions prior to admission  Medication Sig Dispense Refill  . Prenatal Vit-Fe Fumarate-FA (PRENATAL MULTIVITAMIN) TABS Take 1 tablet by mouth at bedtime.        Review of Systems  Constitutional: Negative for fever and chills.  Eyes: Negative for blurred vision.  Respiratory: Negative for shortness of breath.   Cardiovascular: Negative for chest pain.  Gastrointestinal: Negative for nausea and vomiting.  Genitourinary: Negative for dysuria and urgency.  Neurological: Negative for headaches.   Physical Exam   Last menstrual period 02/19/2011.  Physical Exam  Constitutional: She is oriented to person, place, and time. She appears well-nourished.       Appears very comfortable   HENT:  Mouth/Throat: Oropharynx is clear and moist.  Neck: Neck  supple.  Cardiovascular: Normal rate and regular rhythm.   Respiratory: Effort normal and breath sounds normal. No respiratory distress.  GI: She exhibits distension (gravid). There is no tenderness.  Musculoskeletal: She exhibits no edema.  Neurological: She is alert and oriented to person, place, and time.  Dilation: 3 Effacement (%): 60 Cervical Position: Middle Station: -2, ballotable Presentation: Vertex Exam by:: Dr. Ashley Royalty   MAU Course  Procedures  MDM Cervix unchanged from previous exam, some infrequent contractions on monitor.  Will wait 1 hour and reassess.  After 1 hour cervix 3cm-->4cm.  UC:  q2-5 min, irregular   NST: Baseline 135 bpm with moderate variability.  (+) accels, (-) decels. Cat I tracing    Assessment and Plan  1. Labor  -Patient with cervical change over the course of 1 hour, will admit to L&D for expectant management,  -PCN for +GBS   Nattalie Santiesteban 11/14/2011, 7:06 PM

## 2011-11-14 NOTE — Progress Notes (Signed)
Stacey Carrillo is a 21 y.o. [phone] at [redacted]w[redacted]d admitted for active labor  Subjective: Comfortable, infrequent contractions but increased intensity.   Objective: LMP 02/19/2011      FHT:  FHR: 150 bpm, variability: moderate,  accelerations:  Present,  decelerations:  Absent UC:   irregular, every 2-5 minutes SVE:   Dilation: 4 Effacement (%): 70 Station: -2 Exam by:: Dr. Edilia Bo  Labs: Lab Results  Component Value Date   WBC 11.0* 11/14/2011   HGB 13.5 11/14/2011   HCT 40.9 11/14/2011   MCV 77.9* 11/14/2011   PLT 151 11/14/2011    Assessment / Plan: Labor  Labor: No progression since last exam Preeclampsia:  n/a Fetal Wellbeing:  Category I Pain Control:  Labor support without medications I/D:  PCN Anticipated MOD:  NSVD  Stacey Carrillo 11/14/2011, 11:02 PM

## 2011-11-15 DIAGNOSIS — O471 False labor at or after 37 completed weeks of gestation: Secondary | ICD-10-CM

## 2011-11-15 LAB — RPR: RPR Ser Ql: NONREACTIVE

## 2011-11-15 MED ORDER — ZOLPIDEM TARTRATE 10 MG PO TABS
10.0000 mg | ORAL_TABLET | Freq: Once | ORAL | Status: DC
  Filled 2011-11-15: qty 1

## 2011-11-15 NOTE — Discharge Summary (Signed)
Discharge Summary Reason for Admission: Onset of labor   Hemoglobin  Date Value Range Status  11/14/2011 13.5  12.0-15.0 (g/dL) Final     HCT  Date Value Range Status  11/14/2011 40.9  36.0-46.0 (%) Final    Physical Exam:  BP 123/66  Pulse 87  Temp(Src) 98.2 F (36.8 C) (Oral)  Resp 18  LMP 02/19/2011 General appearance: alert, cooperative and no distress Throat: lips, mucosa, and tongue normal; teeth and gums normal Neck: no adenopathy and thyroid not enlarged, symmetric, no tenderness/mass/nodules Lungs: clear to auscultation bilaterally Heart: regular rate and rhythm, S1, S2 normal, no murmur, click, rub or gallop Abdomen: Gravid, non-tender Extremities: extremities normal, atraumatic, no cyanosis or edema   Discharge Diagnoses:  False Labor: Patient with no further cervical change after 5 hours of observation.  Patient had cessation of uterine contractions and was comfortable at time of discharge. Patient agreeable to going home with labor precautions on when to return.    Discharge Information: Date: 11/15/2011 Activity: unrestricted Diet: routine Medications: PNV Condition: stable Instructions: refer to practice specific booklet Discharge to: home Follow-up Information    Follow up with [facility]. (Keep Scheduled appointment)    Contact information:   [address] [place] [address]            Stacey Carrillo 11/15/2011, 1:12 AM

## 2011-11-15 NOTE — H&P (Signed)
Pt seen and examined.  Agree with above note. 

## 2011-11-15 NOTE — Discharge Instructions (Signed)
Normal Labor and Delivery Your caregiver must first be sure you are in labor. Signs of labor include:  You may pass what is called "the mucus plug" before labor begins. This is a small amount of blood stained mucus.   Regular uterine contractions.   The time between contractions get closer together.   The discomfort and pain gradually gets more intense.   Pains are mostly located in the back.   Pains get worse when walking.   The cervix (the opening of the uterus becomes thinner (begins to efface) and opens up (dilates).  Once you are in labor and admitted into the hospital or care center, your caregiver will do the following:  A complete physical examination.   Check your vital signs (blood pressure, pulse, temperature and the fetal heart rate).   Do a vaginal examination (using a sterile glove and lubricant) to determine:   The position (presentation) of the baby (head [vertex] or buttock first).   The level (station) of the baby's head in the birth canal.   The effacement and dilatation of the cervix.   You may have your pubic hair shaved and be given an enema depending on your caregiver and the circumstance.   An electronic monitor is usually placed on your abdomen. The monitor follows the length and intensity of the contractions, as well as the baby's heart rate.   Usually, your caregiver will insert an IV in your arm with a bottle of sugar water. This is done as a precaution so that medications can be given to you quickly during labor or delivery.  NORMAL LABOR AND DELIVERY IS DIVIDED UP INTO 3 STAGES: First Stage This is when regular contractions begin and the cervix begins to efface and dilate. This stage can last from 3 to 15 hours. The end of the first stage is when the cervix is 100% effaced and 10 centimeters dilated. Pain medications may be given by   Injection (morphine, demerol, etc.)   Regional anesthesia (spinal, caudal or epidural, anesthetics given in  different locations of the spine). Paracervical pain medication may be given, which is an injection of and anesthetic on each side of the cervix.  A pregnant woman may request to have "Natural Childbirth" which is not to have any medications or anesthesia during her labor and delivery. Second Stage This is when the baby comes down through the birth canal (vagina) and is born. This can take 1 to 4 hours. As the baby's head comes down through the birth canal, you may feel like you are going to have a bowel movement. You will get the urge to bear down and push until the baby is delivered. As the baby's head is being delivered, the caregiver will decide if an episiotomy (a cut in the perineum and vagina area) is needed to prevent tearing of the tissue in this area. The episiotomy is sewn up after the delivery of the baby and placenta. Sometimes a mask with nitrous oxide is given for the mother to breath during the delivery of the baby to help if there is too much pain. The end of Stage 2 is when the baby is fully delivered. Then when the umbilical cord stops pulsating it is clamped and cut. Third Stage The third stage begins after the baby is completely delivered and ends after the placenta (afterbirth) is delivered. This usually takes 5 to 30 minutes. After the placenta is delivered, a medication is given either by intravenous or injection to help contract   the uterus and prevent bleeding. The third stage is not painful and pain medication is usually not necessary. If an episiotomy was done, it is repaired at this time. After the delivery, the mother is watched and monitored closely for 1 to 2 hours to make sure there is no postpartum bleeding (hemorrhage). If there is a lot of bleeding, medication is given to contract the uterus and stop the bleeding. Document Released: 04/27/2008 Document Revised: 07/08/2011 Document Reviewed: 04/27/2008 [employer] Patient Information 2012 [employer]. 

## 2011-11-15 NOTE — Progress Notes (Signed)
Discharge instructions reviewed and papers signed, Remus Loffler offered but pt declined, d/c'd ambulatory with support persons

## 2011-11-15 NOTE — MAU Provider Note (Signed)
Pt seen and examined.  Agree with above note. 

## 2011-11-16 ENCOUNTER — Ambulatory Visit (INDEPENDENT_AMBULATORY_CARE_PROVIDER_SITE_OTHER): Payer: Medicaid Other | Admitting: Obstetrics & Gynecology

## 2011-11-16 VITALS — BP 115/71 | Wt 184.0 lb

## 2011-11-16 DIAGNOSIS — N390 Urinary tract infection, site not specified: Secondary | ICD-10-CM

## 2011-11-16 DIAGNOSIS — O471 False labor at or after 37 completed weeks of gestation: Secondary | ICD-10-CM

## 2011-11-16 DIAGNOSIS — IMO0002 Reserved for concepts with insufficient information to code with codable children: Secondary | ICD-10-CM

## 2011-11-16 DIAGNOSIS — B951 Streptococcus, group B, as the cause of diseases classified elsewhere: Secondary | ICD-10-CM

## 2011-11-16 DIAGNOSIS — O479 False labor, unspecified: Secondary | ICD-10-CM

## 2011-11-16 DIAGNOSIS — O2390 Unspecified genitourinary tract infection in pregnancy, unspecified trimester: Secondary | ICD-10-CM

## 2011-11-16 DIAGNOSIS — Z3480 Encounter for supervision of other normal pregnancy, unspecified trimester: Secondary | ICD-10-CM

## 2011-11-16 NOTE — Progress Notes (Addendum)
No complaints or concerns. Declined membrane stripping.  Fetal movement and labor precautions reviewed.

## 2011-11-16 NOTE — Patient Instructions (Signed)
Return to clinic for any obstetric concerns or go to MAU for evaluation  

## 2011-11-18 NOTE — Discharge Summary (Signed)
Agree with above note.  Olney Monier H. 11/18/2011 1:56 PM

## 2011-11-23 ENCOUNTER — Ambulatory Visit (INDEPENDENT_AMBULATORY_CARE_PROVIDER_SITE_OTHER): Payer: Medicaid Other | Admitting: Obstetrics & Gynecology

## 2011-11-23 ENCOUNTER — Inpatient Hospital Stay (HOSPITAL_COMMUNITY)
Admission: AD | Admit: 2011-11-23 | Discharge: 2011-11-25 | DRG: 775 | Disposition: A | Discharge status: Home / Self Care | Payer: Medicaid Other | Source: Ambulatory Visit | Attending: Obstetrics & Gynecology | Admitting: Obstetrics & Gynecology

## 2011-11-23 ENCOUNTER — Encounter: Payer: Self-pay | Admitting: Obstetrics & Gynecology

## 2011-11-23 ENCOUNTER — Encounter (HOSPITAL_COMMUNITY): Payer: Self-pay

## 2011-11-23 VITALS — BP 126/74 | Wt 189.0 lb

## 2011-11-23 DIAGNOSIS — Z22330 Carrier of Group B streptococcus: Secondary | ICD-10-CM

## 2011-11-23 DIAGNOSIS — Z3480 Encounter for supervision of other normal pregnancy, unspecified trimester: Secondary | ICD-10-CM

## 2011-11-23 DIAGNOSIS — IMO0002 Reserved for concepts with insufficient information to code with codable children: Secondary | ICD-10-CM

## 2011-11-23 DIAGNOSIS — O99892 Other specified diseases and conditions complicating childbirth: Principal | ICD-10-CM | POA: Diagnosis present

## 2011-11-23 DIAGNOSIS — O2340 Unspecified infection of urinary tract in pregnancy, unspecified trimester: Secondary | ICD-10-CM

## 2011-11-23 DIAGNOSIS — B951 Streptococcus, group B, as the cause of diseases classified elsewhere: Secondary | ICD-10-CM

## 2011-11-23 LAB — CBC
Hemoglobin: 13.1 g/dL (ref 12.0–15.0)
MCH: 25.6 pg — ABNORMAL LOW (ref 26.0–34.0)
Platelets: 149 10*3/uL — ABNORMAL LOW (ref 150–400)
RBC: 5.11 MIL/uL (ref 3.87–5.11)
WBC: 11.5 10*3/uL — ABNORMAL HIGH (ref 4.0–10.5)

## 2011-11-23 MED ORDER — OXYCODONE-ACETAMINOPHEN 5-325 MG PO TABS: 1.0000 | ORAL_TABLET | ORAL | Status: DC | PRN

## 2011-11-23 MED ORDER — LACTATED RINGERS IV SOLN: 500.0000 mL | INTRAVENOUS | Status: DC | PRN

## 2011-11-23 MED ORDER — OXYTOCIN BOLUS FROM INFUSION
500.0000 mL | Freq: Once | INTRAVENOUS | Status: DC
  Filled 2011-11-23: qty 1000
  Filled 2011-11-23: qty 500

## 2011-11-23 MED ORDER — IBUPROFEN 600 MG PO TABS
600.0000 mg | ORAL_TABLET | Freq: Four times a day (QID) | ORAL | Status: DC | PRN
  Filled 2011-11-23: qty 1

## 2011-11-23 MED ORDER — LIDOCAINE HCL (PF) 1 % IJ SOLN
30.0000 mL | INTRAMUSCULAR | Status: DC | PRN
Start: 2011-11-23 — End: 2011-11-24
  Filled 2011-11-23: qty 30

## 2011-11-23 MED ORDER — CITRIC ACID-SODIUM CITRATE 334-500 MG/5ML PO SOLN: 30.0000 mL | ORAL | Status: DC | PRN

## 2011-11-23 MED ORDER — OXYTOCIN 20 UNITS IN LACTATED RINGERS INFUSION - SIMPLE
125.0000 mL/h | Freq: Once | INTRAVENOUS | Status: AC
  Administered 2011-11-24: 500 mL/h via INTRAVENOUS

## 2011-11-23 MED ORDER — PENICILLIN G POTASSIUM 5000000 UNITS IJ SOLR
2.5000 10*6.[IU] | INTRAVENOUS | Status: DC
  Filled 2011-11-23 (×5): qty 2.5

## 2011-11-23 MED ORDER — LACTATED RINGERS IV SOLN
INTRAVENOUS | Status: DC
  Administered 2011-11-23: 20:00:00 via INTRAVENOUS

## 2011-11-23 MED ORDER — ONDANSETRON HCL 4 MG/2ML IJ SOLN: 4.0000 mg | Freq: Four times a day (QID) | INTRAMUSCULAR | Status: DC | PRN

## 2011-11-23 MED ORDER — FLEET ENEMA 7-19 GM/118ML RE ENEM: 1.0000 | ENEMA | RECTAL | Status: DC | PRN

## 2011-11-23 MED ORDER — PENICILLIN G POTASSIUM 5000000 UNITS IJ SOLR
5.0000 10*6.[IU] | Freq: Once | INTRAVENOUS | Status: AC
  Administered 2011-11-23: 5 10*6.[IU] via INTRAVENOUS
  Filled 2011-11-23: qty 5

## 2011-11-23 MED ORDER — ACETAMINOPHEN 325 MG PO TABS: 650.0000 mg | ORAL_TABLET | ORAL | Status: DC | PRN

## 2011-11-23 NOTE — H&P (Signed)
Stacey Carrillo is a 21 y.o. female presenting for active labor Maternal Medical History:  Reason for admission: Reason for admission: contractions.  Reason for Admission:   nauseaContractions: Onset was 6-12 hours ago.   Frequency: regular.   Perceived severity is moderate.    Fetal activity: Perceived fetal activity is normal.   Last perceived fetal movement was within the past hour.    Prenatal complications: Placental abnormality.   Prenatal Complications - Diabetes: none.    OB History    Grav Para Term Preterm Abortions TAB SAB Ect Mult Living   5 2 1 1 2 2  0 0 0 2     Past Medical History  Diagnosis Date  . Anemia   . Preterm labor    Past Surgical History  Procedure Date  . Dilation and curettage of uterus    Family History: family history includes Asthma in her son and Hearing loss in her brother. Social History:  reports that she has quit smoking. Her smoking use included Cigarettes. She smoked 0 packs per day. She has never used smokeless tobacco. She reports that she does not drink alcohol or use illicit drugs.  Review of Systems  Constitutional: Negative for fever and chills.  Eyes: Negative for blurred vision and double vision.  Respiratory: Negative for shortness of breath.   Cardiovascular: Negative for chest pain and palpitations.  Gastrointestinal: Negative for heartburn, nausea, vomiting, abdominal pain, diarrhea, constipation and blood in stool.  Genitourinary: Negative for dysuria, urgency, frequency and hematuria.  Skin: Negative for rash.  Neurological: Negative for dizziness, sensory change and headaches.    Dilation: 5.5 Effacement (%): 70 Station: -1 Exam by:: Dr. Konrad Dolores Temperature 98.5 F (36.9 C), temperature source Oral, last menstrual period 02/19/2011. Maternal Exam:  Uterine Assessment: Contraction frequency is irregular.   Abdomen: Fundal height is Term.   Fetal presentation: vertex  Introitus: Normal vulva. Normal vagina.  Ferning  test: not done.  Nitrazine test: not done.  Pelvis: adequate for delivery.   Cervix: Cervix evaluated by digital exam.     Physical Exam  Constitutional: She is oriented to person, place, and time. She appears well-developed and well-nourished. No distress.  HENT:  Head: Atraumatic.  Eyes: EOM are normal.  Neck: Normal range of motion.  Cardiovascular: Normal rate and regular rhythm.   Respiratory: Effort normal.  GI: Soft. There is no tenderness. There is no rebound and no guarding.  Genitourinary: Vagina normal and uterus normal.  Musculoskeletal: Normal range of motion.  Neurological: She is alert and oriented to person, place, and time.  Skin: Skin is warm and dry. She is not diaphoretic.    Prenatal labs: ABO, Rh: A/POS/-- (10/08 1054) Antibody: NEG (10/08 1054) Rubella: 50.7 (10/08 1054) RPR: NON REACTIVE (04/14 2050)  HBsAg: POSITIVE (10/08 1054)  HIV: NON REACTIVE (10/08 1054)  GBS: Positive (10/08 0000)   Dilation: 5.5 Effacement (%): 70 Station: -1 Presentation: Vertex Exam by:: Dr. Konrad Dolores   Assessment/Plan: 21yo [redacted]w[redacted]d [id number] here for labor. Contractions since 10:30am and cervical change from her appointment earlier today (4.5-5.5/6). BBOW. Marginal cord insertion w/ anterior placenta noted.  - GBS + noted but treated w/ PCN at last admission on 4/17.  - HGsAg + noted. Last LFTs in 11/12 were normal - Admit to L&D for labor management - No Epidural - Plans to BF  Stacey Carrillo 11/23/2011, 7:34 PM

## 2011-11-23 NOTE — Progress Notes (Signed)
Stacey Carrillo is a 21 y.o. [phone] at [redacted]w[redacted]d  Subjective: Breathing with ctx, but says they are less intense than earlier; rec'd 1st dose PCN at 2030  Objective: BP 122/75  Pulse 90  Temp(Src) 98.2 F (36.8 C) (Oral)  Resp 18  Ht 5\' 7"  (1.702 m)  Wt 85.73 kg (189 lb)  BMI 29.60 kg/m2  LMP 02/19/2011      FHT:  FHR: 140 bpm, variability: moderate,  accelerations:  Present,  decelerations:  Absent UC:   irregular, every 2-8 minutes SVE:   Dilation: 5.5 Effacement (%): 80 Station: -2 Exam by:: Stacey Carrillo CNMAROM for clear fluid  Labs: Lab Results  Component Value Date   WBC 11.5* 11/23/2011   HGB 13.1 11/23/2011   HCT 39.7 11/23/2011   MCV 77.7* 11/23/2011   PLT 149* 11/23/2011    Assessment / Plan: Latent phase  AROM with the desire to increase labor Anticipate SVD   Stacey Carrillo 11/23/2011, 10:56 PM

## 2011-11-23 NOTE — Progress Notes (Signed)
Routine visit. No problems. Good FM. No VB or ROM. Labor precautions given.

## 2011-11-24 ENCOUNTER — Encounter (HOSPITAL_COMMUNITY): Payer: Self-pay | Admitting: *Deleted

## 2011-11-24 DIAGNOSIS — O479 False labor, unspecified: Secondary | ICD-10-CM

## 2011-11-24 LAB — RPR: RPR Ser Ql: NONREACTIVE

## 2011-11-24 MED ORDER — ONDANSETRON HCL 4 MG/2ML IJ SOLN: 4.0000 mg | INTRAMUSCULAR | Status: DC | PRN

## 2011-11-24 MED ORDER — LANOLIN HYDROUS EX OINT: TOPICAL_OINTMENT | CUTANEOUS | Status: DC | PRN

## 2011-11-24 MED ORDER — SENNOSIDES-DOCUSATE SODIUM 8.6-50 MG PO TABS
2.0000 | ORAL_TABLET | Freq: Every day | ORAL | Status: DC
  Administered 2011-11-24: 2 via ORAL

## 2011-11-24 MED ORDER — SIMETHICONE 80 MG PO CHEW: 80.0000 mg | CHEWABLE_TABLET | ORAL | Status: DC | PRN

## 2011-11-24 MED ORDER — BENZOCAINE-MENTHOL 20-0.5 % EX AERO: 1.0000 "application " | INHALATION_SPRAY | CUTANEOUS | Status: DC | PRN

## 2011-11-24 MED ORDER — PRENATAL MULTIVITAMIN CH
1.0000 | ORAL_TABLET | Freq: Every day | ORAL | Status: DC
  Administered 2011-11-24 – 2011-11-25 (×2): 1 via ORAL
  Filled 2011-11-24 (×2): qty 1

## 2011-11-24 MED ORDER — WITCH HAZEL-GLYCERIN EX PADS: 1.0000 "application " | MEDICATED_PAD | CUTANEOUS | Status: DC | PRN

## 2011-11-24 MED ORDER — ONDANSETRON HCL 4 MG PO TABS: 4.0000 mg | ORAL_TABLET | ORAL | Status: DC | PRN

## 2011-11-24 MED ORDER — IBUPROFEN 600 MG PO TABS
600.0000 mg | ORAL_TABLET | Freq: Four times a day (QID) | ORAL | Status: DC
  Administered 2011-11-24 – 2011-11-25 (×7): 600 mg via ORAL
  Filled 2011-11-24 (×6): qty 1

## 2011-11-24 MED ORDER — ZOLPIDEM TARTRATE 5 MG PO TABS: 5.0000 mg | ORAL_TABLET | Freq: Every evening | ORAL | Status: DC | PRN

## 2011-11-24 MED ORDER — NALBUPHINE SYRINGE 5 MG/0.5 ML
10.0000 mg | INJECTION | Freq: Four times a day (QID) | INTRAMUSCULAR | Status: DC | PRN
  Filled 2011-11-24 (×2): qty 1

## 2011-11-24 MED ORDER — DIPHENHYDRAMINE HCL 25 MG PO CAPS: 25.0000 mg | ORAL_CAPSULE | Freq: Four times a day (QID) | ORAL | Status: DC | PRN

## 2011-11-24 MED ORDER — OXYCODONE-ACETAMINOPHEN 5-325 MG PO TABS
1.0000 | ORAL_TABLET | ORAL | Status: DC | PRN
  Administered 2011-11-24 (×3): 1 via ORAL
  Filled 2011-11-24 (×3): qty 1

## 2011-11-24 MED ORDER — DIBUCAINE 1 % RE OINT: 1.0000 "application " | TOPICAL_OINTMENT | RECTAL | Status: DC | PRN

## 2011-11-24 MED ORDER — TETANUS-DIPHTH-ACELL PERTUSSIS 5-2.5-18.5 LF-MCG/0.5 IM SUSP
0.5000 mL | Freq: Once | INTRAMUSCULAR | Status: AC
  Administered 2011-11-24: 0.5 mL via INTRAMUSCULAR
  Filled 2011-11-24: qty 0.5

## 2011-11-24 NOTE — Progress Notes (Signed)
UR chart review completed.  

## 2011-11-25 MED ORDER — LANOLIN HYDROUS EX OINT: 1.0000 "application " | TOPICAL_OINTMENT | CUTANEOUS | Status: DC | PRN

## 2011-11-25 MED ORDER — IBUPROFEN 600 MG PO TABS: 600.0000 mg | ORAL_TABLET | Freq: Four times a day (QID) | ORAL | Status: AC | PRN

## 2011-11-25 MED ORDER — OXYCODONE-ACETAMINOPHEN 5-325 MG PO TABS: 1.0000 | ORAL_TABLET | Freq: Four times a day (QID) | ORAL | Status: AC | PRN

## 2011-11-25 NOTE — Discharge Instructions (Signed)
Congratulations on your baby.  Schedule an appointment to see your doctor in 6 weeks.   You can still get pregnant when you are breast feeding.

## 2011-11-25 NOTE — Discharge Summary (Signed)
I have seen and evaluated this patient and agree with the above resident's note.  LEFTWICH-KIRBY, Olivianna Higley Certified Nurse-Midwife 

## 2011-11-25 NOTE — Discharge Summary (Addendum)
Obstetric Discharge Summary Reason for Admission: onset of labor Prenatal Procedures: none Intrapartum Procedures: spontaneous vaginal delivery Postpartum Procedures: none Complications-Operative and Postpartum: none Hemoglobin  Date Value Range Status  11/23/2011 13.1  12.0-15.0 (g/dL) Final     HCT  Date Value Range Status  11/23/2011 39.7  36.0-46.0 (%) Final    Physical Exam:  General: alert and cooperative Lochia: appropriate Uterine Fundus: firm Incision: n/a DVT Evaluation: No evidence of DVT seen on physical exam.  Discharge Diagnoses: Term Pregnancy-delivered  Discharge Information: Date: 11/25/2011 Activity: unrestricted Diet: routine Medications: PNV, Ibuprofen and Percocet Condition: stable Instructions: refer to practice specific booklet Discharge to: home   Newborn Data: Live born female  Birth Weight: 8 lb 9.9 oz (3909 g) APGAR: 9, 9  Home with mother.  Edd Arbour MD 11/25/2011, 7:15 AM

## 2011-11-30 ENCOUNTER — Encounter: Payer: Medicaid Other | Admitting: Obstetrics and Gynecology

## 2012-01-05 ENCOUNTER — Ambulatory Visit: Payer: Medicaid Other | Admitting: Obstetrics & Gynecology

## 2012-01-11 ENCOUNTER — Ambulatory Visit (INDEPENDENT_AMBULATORY_CARE_PROVIDER_SITE_OTHER): Payer: Medicaid Other | Admitting: Obstetrics & Gynecology

## 2012-01-11 ENCOUNTER — Other Ambulatory Visit (HOSPITAL_COMMUNITY)
Admission: RE | Admit: 2012-01-11 | Discharge: 2012-01-11 | Disposition: A | Discharge status: Home / Self Care | Payer: Medicaid Other | Source: Ambulatory Visit | Attending: Obstetrics & Gynecology | Admitting: Obstetrics & Gynecology

## 2012-01-11 ENCOUNTER — Encounter: Payer: Self-pay | Admitting: Obstetrics & Gynecology

## 2012-01-11 DIAGNOSIS — R87810 Cervical high risk human papillomavirus (HPV) DNA test positive: Secondary | ICD-10-CM | POA: Insufficient documentation

## 2012-01-11 DIAGNOSIS — IMO0002 Reserved for concepts with insufficient information to code with codable children: Secondary | ICD-10-CM | POA: Insufficient documentation

## 2012-01-11 DIAGNOSIS — Z01419 Encounter for gynecological examination (general) (routine) without abnormal findings: Secondary | ICD-10-CM | POA: Insufficient documentation

## 2012-01-11 NOTE — Progress Notes (Signed)
  Subjective:     Stacey Carrillo is a 21 y.o. [phone] female who presents for a postpartum visit. She is 7 weeks postpartum following a spontaneous vaginal delivery. I have fully reviewed the prenatal and intrapartum course. The delivery was at 39 gestational weeks. Outcome: spontaneous vaginal delivery. Anesthesia: none. Postpartum course has been uncomplicated. Baby's course has been uncomplicated. Baby is feeding by breast. Bleeding: no bleeding. Bowel function is normal. Bladder function is normal. Patient is sexually active. Contraception method is none and she desires Nexplanon. Postpartum depression screening: negative.  The following portions of the patient's history were reviewed and updated as appropriate: allergies, current medications, past family history, past medical history, past social history, past surgical history and problem list.  Review of Systems A comprehensive review of systems was negative.   Objective:    BP 96/57  Pulse 76  Ht 5\' 7"  (1.702 m)  Wt 167 lb (75.751 kg)  BMI 26.16 kg/m2  Breastfeeding? Yes  General:  cooperative and no distress   Breasts:  inspection negative, no nipple discharge or bleeding, no masses or nodularity palpable  Lungs: clear to auscultation bilaterally  Heart:  regular rate and rhythm  Abdomen: soft, non-tender; bowel sounds normal; no masses,  no organomegaly   Vulva:  normal  Vagina: normal vagina  Cervix:  multiparous appearance, no bleeding following Pap, no cervical motion tenderness and no lesions  Corpus: normal  Adnexa:  normal adnexa  Rectal Exam: Not performed.        Assessment:    Normal postpartum exam. Pap smear done at today's visit.   Plan:    1. Contraception: Nexplanon and will return for placement 2. Cervical dysplasia: Follow up pap smear today and manage accordingly. 3. Follow up as needed.

## 2012-01-11 NOTE — Patient Instructions (Signed)
Preventive Care for Adults, Female A healthy lifestyle and preventive care can promote health and wellness. Preventive health guidelines for women include the following key practices.  A routine yearly physical is a good way to check with your caregiver about your health and preventive screening. It is a chance to share any concerns and updates on your health, and to receive a thorough exam.   Visit your dentist for a routine exam and preventive care every 6 months. Brush your teeth twice a day and floss once a day. Good oral hygiene prevents tooth decay and gum disease.   The frequency of eye exams is based on your age, health, family medical history, use of contact lenses, and other factors. Follow your caregiver's recommendations for frequency of eye exams.   Eat a healthy diet. Foods like vegetables, fruits, whole grains, low-fat dairy products, and lean protein foods contain the nutrients you need without too many calories. Decrease your intake of foods high in solid fats, added sugars, and salt. Eat the right amount of calories for you.Get information about a proper diet from your caregiver, if necessary.   Regular physical exercise is one of the most important things you can do for your health. Most adults should get at least 150 minutes of moderate-intensity exercise (any activity that increases your heart rate and causes you to sweat) each week. In addition, most adults need muscle-strengthening exercises on 2 or more days a week.   Maintain a healthy weight. The body mass index (BMI) is a screening tool to identify possible weight problems. It provides an estimate of body fat based on height and weight. Your caregiver can help determine your BMI, and can help you achieve or maintain a healthy weight.For adults 20 years and older:   A BMI below 18.5 is considered underweight.   A BMI of 18.5 to 24.9 is normal.   A BMI of 25 to 29.9 is considered overweight.   A BMI of 30 and above is  considered obese.   Maintain normal blood lipids and cholesterol levels by exercising and minimizing your intake of saturated fat. Eat a balanced diet with plenty of fruit and vegetables. Blood tests for lipids and cholesterol should begin at age 20 and be repeated every 5 years. If your lipid or cholesterol levels are high, you are over 50, or you are at high risk for heart disease, you may need your cholesterol levels checked more frequently.Ongoing high lipid and cholesterol levels should be treated with medicines if diet and exercise are not effective.   If you smoke, find out from your caregiver how to quit. If you do not use tobacco, do not start.   If you are pregnant, do not drink alcohol. If you are breastfeeding, be very cautious about drinking alcohol. If you are not pregnant and choose to drink alcohol, do not exceed 1 drink per day. One drink is considered to be 12 ounces (355 mL) of beer, 5 ounces (148 mL) of wine, or 1.5 ounces (44 mL) of liquor.   Avoid use of street drugs. Do not share needles with anyone. Ask for help if you need support or instructions about stopping the use of drugs.   High blood pressure causes heart disease and increases the risk of stroke. Your blood pressure should be checked at least every 1 to 2 years. Ongoing high blood pressure should be treated with medicines if weight loss and exercise are not effective.   If you are 55 to 22   years old, ask your caregiver if you should take aspirin to prevent strokes.   Diabetes screening involves taking a blood sample to check your fasting blood sugar level. This should be done once every 3 years, after age 45, if you are within normal weight and without risk factors for diabetes. Testing should be considered at a younger age or be carried out more frequently if you are overweight and have at least 1 risk factor for diabetes.   Breast cancer screening is essential preventive care for women. You should practice "breast  self-awareness." This means understanding the normal appearance and feel of your breasts and may include breast self-examination. Any changes detected, no matter how small, should be reported to a caregiver. Women in their 20s and 30s should have a clinical breast exam (CBE) by a caregiver as part of a regular health exam every 1 to 3 years. After age 40, women should have a CBE every year. Starting at age 40, women should consider having a mammography (breast X-ray test) every year. Women who have a family history of breast cancer should talk to their caregiver about genetic screening. Women at a high risk of breast cancer should talk to their caregivers about having magnetic resonance imaging (MRI) and a mammography every year.   The Pap test is a screening test for cervical cancer. A Pap test can show cell changes on the cervix that might become cervical cancer if left untreated. A Pap test is a procedure in which cells are obtained and examined from the lower end of the uterus (cervix).   Women should have a Pap test starting at age 21.   Between ages 21 and 29, Pap tests should be repeated every 2 years.   Beginning at age 30, you should have a Pap test every 3 years as long as the past 3 Pap tests have been normal.   Some women have medical problems that increase the chance of getting cervical cancer. Talk to your caregiver about these problems. It is especially important to talk to your caregiver if a new problem develops soon after your last Pap test. In these cases, your caregiver may recommend more frequent screening and Pap tests.   The above recommendations are the same for women who have or have not gotten the vaccine for human papillomavirus (HPV).   If you had a hysterectomy for a problem that was not cancer or a condition that could lead to cancer, then you no longer need Pap tests. Even if you no longer need a Pap test, a regular exam is a good idea to make sure no other problems are  starting.   If you are between ages 65 and 70, and you have had normal Pap tests going back 10 years, you no longer need Pap tests. Even if you no longer need a Pap test, a regular exam is a good idea to make sure no other problems are starting.   If you have had past treatment for cervical cancer or a condition that could lead to cancer, you need Pap tests and screening for cancer for at least 20 years after your treatment.   If Pap tests have been discontinued, risk factors (such as a new sexual partner) need to be reassessed to determine if screening should be resumed.   The HPV test is an additional test that may be used for cervical cancer screening. The HPV test looks for the virus that can cause the cell changes on the cervix.   The cells collected during the Pap test can be tested for HPV. The HPV test could be used to screen women aged 30 years and older, and should be used in women of any age who have unclear Pap test results. After the age of 30, women should have HPV testing at the same frequency as a Pap test.   Colorectal cancer can be detected and often prevented. Most routine colorectal cancer screening begins at the age of 50 and continues through age 75. However, your caregiver may recommend screening at an earlier age if you have risk factors for colon cancer. On a yearly basis, your caregiver may provide home test kits to check for hidden blood in the stool. Use of a small camera at the end of a tube, to directly examine the colon (sigmoidoscopy or colonoscopy), can detect the earliest forms of colorectal cancer. Talk to your caregiver about this at age 50, when routine screening begins. Direct examination of the colon should be repeated every 5 to 10 years through age 75, unless early forms of pre-cancerous polyps or small growths are found.   Hepatitis C blood testing is recommended for all people born from 1945 through 1965 and any individual with known risks for hepatitis C.    Practice safe sex. Use condoms and avoid high-risk sexual practices to reduce the spread of sexually transmitted infections (STIs). STIs include gonorrhea, chlamydia, syphilis, trichomonas, herpes, HPV, and human immunodeficiency virus (HIV). Herpes, HIV, and HPV are viral illnesses that have no cure. They can result in disability, cancer, and death. Sexually active women aged 25 and younger should be checked for chlamydia. Older women with new or multiple partners should also be tested for chlamydia. Testing for other STIs is recommended if you are sexually active and at increased risk.   Osteoporosis is a disease in which the bones lose minerals and strength with aging. This can result in serious bone fractures. The risk of osteoporosis can be identified using a bone density scan. Women ages 65 and over and women at risk for fractures or osteoporosis should discuss screening with their caregivers. Ask your caregiver whether you should take a calcium supplement or vitamin D to reduce the rate of osteoporosis.   Menopause can be associated with physical symptoms and risks. Hormone replacement therapy is available to decrease symptoms and risks. You should talk to your caregiver about whether hormone replacement therapy is right for you.   Use sunscreen with sun protection factor (SPF) of 30 or more. Apply sunscreen liberally and repeatedly throughout the day. You should seek shade when your shadow is shorter than you. Protect yourself by wearing long sleeves, pants, a wide-brimmed hat, and sunglasses year round, whenever you are outdoors.   Once a month, do a whole body skin exam, using a mirror to look at the skin on your back. Notify your caregiver of new moles, moles that have irregular borders, moles that are larger than a pencil eraser, or moles that have changed in shape or color.   Stay current with required immunizations.   Influenza. You need a dose every fall (or winter). The composition of  the flu vaccine changes each year, so being vaccinated once is not enough.   Pneumococcal polysaccharide. You need 1 to 2 doses if you smoke cigarettes or if you have certain chronic medical conditions. You need 1 dose at age 65 (or older) if you have never been vaccinated.   Tetanus, diphtheria, pertussis (Tdap, Td). Get 1 dose of   Tdap vaccine if you are younger than age 65, are over 65 and have contact with an infant, are a healthcare worker, are pregnant, or simply want to be protected from whooping cough. After that, you need a Td booster dose every 10 years. Consult your caregiver if you have not had at least 3 tetanus and diphtheria-containing shots sometime in your life or have a deep or dirty wound.   HPV. You need this vaccine if you are a woman age 26 or younger. The vaccine is given in 3 doses over 6 months.   Measles, mumps, rubella (MMR). You need at least 1 dose of MMR if you were born in 1957 or later. You may also need a second dose.   Meningococcal. If you are age 19 to 21 and a first-year college student living in a residence hall, or have one of several medical conditions, you need to get vaccinated against meningococcal disease. You may also need additional booster doses.   Zoster (shingles). If you are age 60 or older, you should get this vaccine.   Varicella (chickenpox). If you have never had chickenpox or you were vaccinated but received only 1 dose, talk to your caregiver to find out if you need this vaccine.   Hepatitis A. You need this vaccine if you have a specific risk factor for hepatitis A virus infection or you simply wish to be protected from this disease. The vaccine is usually given as 2 doses, 6 to 18 months apart.   Hepatitis B. You need this vaccine if you have a specific risk factor for hepatitis B virus infection or you simply wish to be protected from this disease. The vaccine is given in 3 doses, usually over 6 months.  Preventive Services /  Frequency Ages 19 to 39  Blood pressure check.** / Every 1 to 2 years.   Lipid and cholesterol check.** / Every 5 years beginning at age 20.   Clinical breast exam.** / Every 3 years for women in their 20s and 30s.   Pap test.** / Every 2 years from ages 21 through 29. Every 3 years starting at age 30 through age 65 or 70 with a history of 3 consecutive normal Pap tests.   HPV screening.** / Every 3 years from ages 30 through ages 65 to 70 with a history of 3 consecutive normal Pap tests.   Hepatitis C blood test.** / For any individual with known risks for hepatitis C.   Skin self-exam. / Monthly.   Influenza immunization.** / Every year.   Pneumococcal polysaccharide immunization.** / 1 to 2 doses if you smoke cigarettes or if you have certain chronic medical conditions.   Tetanus, diphtheria, pertussis (Tdap, Td) immunization. / A one-time dose of Tdap vaccine. After that, you need a Td booster dose every 10 years.   HPV immunization. / 3 doses over 6 months, if you are 26 and younger.   Measles, mumps, rubella (MMR) immunization. / You need at least 1 dose of MMR if you were born in 1957 or later. You may also need a second dose.   Meningococcal immunization. / 1 dose if you are age 19 to 21 and a first-year college student living in a residence hall, or have one of several medical conditions, you need to get vaccinated against meningococcal disease. You may also need additional booster doses.   Varicella immunization.** / Consult your caregiver.   Hepatitis A immunization.** / Consult your caregiver. 2 doses, 6 to 18 months   apart.   Hepatitis B immunization.** / Consult your caregiver. 3 doses usually over 6 months.    ** Family history and personal history of risk and conditions may change your caregiver's recommendations. Document Released: 09/14/2001 Document Revised: 07/08/2011 Document Reviewed: 12/14/2010 [employer] Patient Information 2012 [employer]. 

## 2012-01-19 ENCOUNTER — Encounter: Payer: Self-pay | Admitting: Obstetrics & Gynecology

## 2012-01-25 ENCOUNTER — Encounter: Payer: Medicaid Other | Admitting: Obstetrics & Gynecology

## 2012-03-23 ENCOUNTER — Emergency Department (HOSPITAL_COMMUNITY)
Admission: EM | Admit: 2012-03-23 | Discharge: 2012-03-23 | Disposition: A | Discharge status: Home / Self Care | Payer: Medicaid Other | Attending: Emergency Medicine | Admitting: Emergency Medicine

## 2012-03-23 ENCOUNTER — Encounter (HOSPITAL_COMMUNITY): Payer: Self-pay | Admitting: Emergency Medicine

## 2012-03-23 DIAGNOSIS — Z77098 Contact with and (suspected) exposure to other hazardous, chiefly nonmedicinal, chemicals: Secondary | ICD-10-CM

## 2012-03-23 DIAGNOSIS — A491 Streptococcal infection, unspecified site: Secondary | ICD-10-CM | POA: Insufficient documentation

## 2012-03-23 DIAGNOSIS — Y9229 Other specified public building as the place of occurrence of the external cause: Secondary | ICD-10-CM | POA: Insufficient documentation

## 2012-03-23 DIAGNOSIS — T1590XA Foreign body on external eye, part unspecified, unspecified eye, initial encounter: Secondary | ICD-10-CM | POA: Insufficient documentation

## 2012-03-23 DIAGNOSIS — S0501XA Injury of conjunctiva and corneal abrasion without foreign body, right eye, initial encounter: Secondary | ICD-10-CM

## 2012-03-23 DIAGNOSIS — F17200 Nicotine dependence, unspecified, uncomplicated: Secondary | ICD-10-CM | POA: Insufficient documentation

## 2012-03-23 DIAGNOSIS — S058X9A Other injuries of unspecified eye and orbit, initial encounter: Secondary | ICD-10-CM | POA: Insufficient documentation

## 2012-03-23 DIAGNOSIS — IMO0002 Reserved for concepts with insufficient information to code with codable children: Secondary | ICD-10-CM | POA: Insufficient documentation

## 2012-03-23 HISTORY — DX: Streptococcal infection, unspecified site: A49.1

## 2012-03-23 MED ORDER — TETRACAINE HCL 0.5 % OP SOLN
OPHTHALMIC | Status: AC
  Filled 2012-03-23: qty 2

## 2012-03-23 MED ORDER — FLUORESCEIN SODIUM 1 MG OP STRP
ORAL_STRIP | OPHTHALMIC | Status: AC
  Filled 2012-03-23: qty 1

## 2012-03-23 MED ORDER — CIPROFLOXACIN HCL 0.3 % OP SOLN: 2.0000 [drp] | Freq: Four times a day (QID) | OPHTHALMIC | Status: AC

## 2012-03-23 MED ORDER — TETANUS-DIPHTH-ACELL PERTUSSIS 5-2.5-18.5 LF-MCG/0.5 IM SUSP
0.5000 mL | Freq: Once | INTRAMUSCULAR | Status: AC
  Administered 2012-03-23: 0.5 mL via INTRAMUSCULAR
  Filled 2012-03-23: qty 0.5
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
## 2012-03-24 NOTE — ED Provider Notes (Signed)
Agree with plan of care  Shiva Karis T Hye Trawick, MD 03/24/12 0119 

## 2012-04-21 ENCOUNTER — Emergency Department (HOSPITAL_COMMUNITY): Payer: Medicaid Other

## 2012-04-21 ENCOUNTER — Encounter (HOSPITAL_COMMUNITY): Payer: Self-pay | Admitting: Emergency Medicine

## 2012-04-21 ENCOUNTER — Emergency Department (HOSPITAL_COMMUNITY)
Admission: EM | Admit: 2012-04-21 | Discharge: 2012-04-21 | Disposition: A | Discharge status: Home / Self Care | Payer: Medicaid Other | Attending: Emergency Medicine | Admitting: Emergency Medicine

## 2012-04-21 DIAGNOSIS — S301XXA Contusion of abdominal wall, initial encounter: Secondary | ICD-10-CM | POA: Insufficient documentation

## 2012-04-21 DIAGNOSIS — S40019A Contusion of unspecified shoulder, initial encounter: Secondary | ICD-10-CM

## 2012-04-21 LAB — URINE MICROSCOPIC-ADD ON

## 2012-04-21 LAB — BASIC METABOLIC PANEL
BUN: 10 mg/dL (ref 6–23)
Chloride: 105 mEq/L (ref 96–112)
GFR calc Af Amer: >90 mL/min (ref >90)
Glucose, Bld: 116 mg/dL — ABNORMAL HIGH (ref 70–99)
Potassium: 3.6 mEq/L (ref 3.5–5.1)

## 2012-04-21 LAB — URINALYSIS, ROUTINE W REFLEX MICROSCOPIC
Bilirubin Urine: NEGATIVE
Nitrite: NEGATIVE
Protein, ur: 30 mg/dL — AB
Specific Gravity, Urine: 1.025 (ref 1.005–1.030)
Urobilinogen, UA: 1 mg/dL (ref 0.0–1.0)

## 2012-04-21 LAB — CBC
HCT: 41.8 % (ref 36.0–46.0)
Hemoglobin: 13.9 g/dL (ref 12.0–15.0)
RBC: 5.26 MIL/uL — ABNORMAL HIGH (ref 3.87–5.11)

## 2012-04-21 LAB — PREGNANCY, URINE: Preg Test, Ur: NEGATIVE

## 2012-04-21 MED ORDER — IOHEXOL 300 MG/ML  SOLN
80.0000 mL | Freq: Once | INTRAMUSCULAR | Status: AC | PRN
  Administered 2012-04-21: 80 mL via INTRAVENOUS

## 2012-04-21 MED ORDER — SODIUM CHLORIDE 0.9 % IV BOLUS (SEPSIS): 1000.0000 mL | Freq: Once | INTRAVENOUS | Status: DC

## 2012-04-21 MED ORDER — SODIUM CHLORIDE 0.9 % IV BOLUS (SEPSIS): 20.0000 mL/kg | Freq: Once | INTRAVENOUS | Status: DC

## 2012-04-21 MED ORDER — SODIUM CHLORIDE 0.9 % IV BOLUS (SEPSIS)
1000.0000 mL | Freq: Once | INTRAVENOUS | Status: AC
  Administered 2012-04-21: 1000 mL via INTRAVENOUS
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
## 2012-04-21 NOTE — ED Notes (Addendum)
Patient transported to CT 

## 2012-08-02 NOTE — L&D Delivery Note (Signed)
Delivery Note At 6:41 AM a viable and healthy female was delivered via Vaginal, Spontaneous Delivery (Presentation: ; Occiput Anterior).  APGAR: 8, 9; weight .   Placenta status: Intact, Spontaneous.  Cord: 3 vessels with the following complications: None. Nuchal cord x2.  Anesthesia: None  Episiotomy: None Lacerations: None Suture Repair: n/a Est. Blood Loss (mL): 250  22 yo G7 now P3134 s/p NSVD over intact perineum delivered healthy girl. Active management of 3rd stage of labor delivered intact placenta. No lacerations visualized. Hemostasis was achieved prior to leaving the room.  Mom to postpartum.  Baby to Couplet care / Skin to Skin.  Pior, Jearld Lesch 07/17/2013, 7:00 AM   I was present for and supervised the delivery and agree with above documentation in the resident's note.   Rulon Abide, M.D. [facility] Fellow 07/17/2013 7:04 AM

## 2012-08-21 ENCOUNTER — Inpatient Hospital Stay (HOSPITAL_COMMUNITY)
Admission: AD | Admit: 2012-08-21 | Discharge: 2012-08-21 | Disposition: A | Discharge status: Home / Self Care | Payer: Medicaid Other | Source: Ambulatory Visit | Attending: Obstetrics and Gynecology | Admitting: Obstetrics and Gynecology

## 2012-08-21 ENCOUNTER — Inpatient Hospital Stay (HOSPITAL_COMMUNITY)
Admission: AD | Admit: 2012-08-21 | Discharge: 2012-08-22 | Disposition: A | Discharge status: Home / Self Care | Payer: Medicaid Other | Source: Ambulatory Visit | Attending: Obstetrics & Gynecology | Admitting: Obstetrics & Gynecology

## 2012-08-21 ENCOUNTER — Encounter (HOSPITAL_COMMUNITY): Payer: Self-pay | Admitting: *Deleted

## 2012-08-21 ENCOUNTER — Encounter (HOSPITAL_COMMUNITY): Payer: Self-pay

## 2012-08-21 ENCOUNTER — Inpatient Hospital Stay (HOSPITAL_COMMUNITY): Payer: Medicaid Other

## 2012-08-21 DIAGNOSIS — O209 Hemorrhage in early pregnancy, unspecified: Secondary | ICD-10-CM | POA: Insufficient documentation

## 2012-08-21 DIAGNOSIS — O26849 Uterine size-date discrepancy, unspecified trimester: Secondary | ICD-10-CM

## 2012-08-21 DIAGNOSIS — O034 Incomplete spontaneous abortion without complication: Secondary | ICD-10-CM | POA: Insufficient documentation

## 2012-08-21 DIAGNOSIS — R109 Unspecified abdominal pain: Secondary | ICD-10-CM

## 2012-08-21 LAB — CBC
Hemoglobin: 13 g/dL (ref 12.0–15.0)
MCH: 25.9 pg — ABNORMAL LOW (ref 26.0–34.0)
RBC: 5.02 MIL/uL (ref 3.87–5.11)
WBC: 8.9 10*3/uL (ref 4.0–10.5)

## 2012-08-21 LAB — GC/CHLAMYDIA PROBE AMP
CT Probe RNA: NEGATIVE
GC Probe RNA: NEGATIVE

## 2012-08-21 LAB — WET PREP, GENITAL: Yeast Wet Prep HPF POC: NONE SEEN

## 2012-08-21 LAB — URINALYSIS, ROUTINE W REFLEX MICROSCOPIC
Bilirubin Urine: NEGATIVE
Nitrite: NEGATIVE
Specific Gravity, Urine: 1.01 (ref 1.005–1.030)
pH: 6 (ref 5.0–8.0)

## 2012-08-21 LAB — URINE MICROSCOPIC-ADD ON

## 2012-08-21 LAB — POCT PREGNANCY, URINE: Preg Test, Ur: POSITIVE — AB

## 2012-08-21 NOTE — MAU Provider Note (Signed)
Attestation of Attending Supervision of Advanced Practitioner: Evaluation and management procedures were performed by the PA/NP/CNM/OB Fellow under my supervision/collaboration. Chart reviewed and agree with management and plan.  Talani Brazee V 08/21/2012 10:07 PM

## 2012-08-21 NOTE — MAU Note (Signed)
Pt states that she has been bleeding all day-it became much heavier about 2100

## 2012-08-21 NOTE — MAU Provider Note (Signed)
History     CSN: [id number]  Arrival date and time: 08/21/12 0007   First Provider Initiated Contact with Patient 08/21/12 0053      Chief Complaint  Patient presents with  . Vaginal Bleeding   HPI [name] is a 22 y.o. [id number] at [redacted]w[redacted]d who presents to MAU today with complaint of vaginal bleeding. The patient states that she has had occasional spotting throughout this pregnancy, however, earlier today it became bright red blood with a few small clots. She states that she has had cramping throughout her pregnancy as well, but today it has been worse. She rates it at 5/10 today. The patient denies abnormal discharge, LOF or contractions. She also denies any urinary symptoms at this time. She denies fever. She has had some mild nausea but no vomiting.   OB History    Grav Para Term Preterm Abortions TAB SAB Ect Mult Living   6 3 2 1 2 2  0 0 0 3      Past Medical History  Diagnosis Date  . Anemia   . Preterm labor   . Group B streptococcal infection     Past Surgical History  Procedure Date  . Dilation and curettage of uterus     Family History  Problem Relation Age of Onset  . Hearing loss Brother   . Asthma Son     History  Substance Use Topics  . Smoking status: Current Some Day Smoker -- 2.0 packs/day    Types: Cigarettes  . Smokeless tobacco: Never Used     Comment: pt stated she is trying to quite down to 1 ciggarette/day  . Alcohol Use: No    Allergies: No Known Allergies  Prescriptions prior to admission  Medication Sig Dispense Refill  . Prenatal Vit-Fe Fumarate-FA (PRENATAL MULTIVITAMIN) TABS Take 1 tablet by mouth daily.        ROS All negative unless otherwise noted in HPI Physical Exam   Blood pressure 147/71, pulse 82, temperature 99 F (37.2 C), temperature source Oral, resp. rate 18, height 5\' 7"  (1.702 m), weight 184 lb 12.8 oz (83.825 kg), last menstrual period 06/24/2012, SpO2 100.00%.  Physical Exam  Constitutional: She is oriented  to person, place, and time. She appears well-developed and well-nourished. No distress.  HENT:  Head: Normocephalic and atraumatic.  Cardiovascular: Normal rate.   Respiratory: Effort normal.  GI: Soft. Bowel sounds are normal. She exhibits no distension and no mass. There is Tenderness: mild tenderness of the lower abdomen.. There is no rebound and no guarding.  Genitourinary: Vagina normal. Uterus is tender (mild tenderness to palpation of the suprapubic region at the midline). Uterus is not enlarged. Cervix exhibits discharge (mucus and small amount of blood noted at the cervical os and in the vagina) and friability. Cervix exhibits no motion tenderness. Right adnexum displays no mass and no tenderness. Left adnexum displays no mass and no tenderness.  Neurological: She is alert and oriented to person, place, and time.  Skin: Skin is warm and dry. No erythema.  Psychiatric: She has a normal mood and affect.   Results for orders placed during the hospital encounter of 08/21/12 (from the past 24 hour(s))  URINALYSIS, ROUTINE W REFLEX MICROSCOPIC     Status: Abnormal   Collection Time   08/21/12 12:19 AM      Component Value Range   Color, Urine YELLOW  YELLOW   APPearance CLEAR  CLEAR   Specific Gravity, Urine 1.010  1.005 - 1.030  pH 6.0  5.0 - 8.0   Glucose, UA NEGATIVE  NEGATIVE mg/dL   Hgb urine dipstick LARGE (*) NEGATIVE   Bilirubin Urine NEGATIVE  NEGATIVE   Ketones, ur NEGATIVE  NEGATIVE mg/dL   Protein, ur NEGATIVE  NEGATIVE mg/dL   Urobilinogen, UA 0.2  0.0 - 1.0 mg/dL   Nitrite NEGATIVE  NEGATIVE   Leukocytes, UA NEGATIVE  NEGATIVE  URINE MICROSCOPIC-ADD ON     Status: Normal   Collection Time   08/21/12 12:19 AM      Component Value Range   Squamous Epithelial / LPF RARE  RARE   WBC, UA 0-2  <3 WBC/hpf   RBC / HPF 11-20  <3 RBC/hpf   Bacteria, UA RARE  RARE  POCT PREGNANCY, URINE     Status: Abnormal   Collection Time   08/21/12 12:37 AM      Component Value Range    Preg Test, Ur POSITIVE (*) NEGATIVE  WET PREP, GENITAL     Status: Abnormal   Collection Time   08/21/12  1:24 AM      Component Value Range   Yeast Wet Prep HPF POC NONE SEEN  NONE SEEN   Trich, Wet Prep NONE SEEN  NONE SEEN   Clue Cells Wet Prep HPF POC NONE SEEN  NONE SEEN   WBC, Wet Prep HPF POC FEW (*) NONE SEEN  CBC     Status: Abnormal   Collection Time   08/21/12  1:26 AM      Component Value Range   WBC 8.9  4.0 - 10.5 K/uL   RBC 5.02  3.87 - 5.11 MIL/uL   Hemoglobin 13.0  12.0 - 15.0 g/dL   HCT 40.9  81.1 - 91.4 %   MCV 78.5  78.0 - 100.0 fL   MCH 25.9 (*) 26.0 - 34.0 pg   MCHC 33.0  30.0 - 36.0 g/dL   RDW 78.2  95.6 - 21.3 %   Platelets 197  150 - 400 K/uL   *RADIOLOGY REPORT*   Clinical Data: Vaginal bleeding and lower abdominal cramping.   OBSTETRIC <14 WK [place] AND TRANSVAGINAL OB US   Technique: Both transabdominal and transvaginal ultrasound  examinations were performed for complete evaluation of the  gestation as well as the maternal uterus, adnexal regions, and  pelvic cul-de-sac. Transvaginal technique was performed to assess  early pregnancy.   Comparison: Prior ultrasound of pregnancy performed 08/31/2011,  and CT of the abdomen and pelvis performed 04/21/2012  Intrauterine gestational sac: Visualized / slightly irregular in  shape.   Yolk sac: No  Embryo: No  Cardiac Activity: N/A  MSD: 1.65 cm 6 w 4 d   Maternal uterus/adnexae:  A moderate amount of subchorionic hemorrhage is noted. The uterus  is otherwise unremarkable in appearance.  The ovaries are within normal limits. The right ovary measures 4.6  x 1.8 x 2.1 cm, while the left ovary measures 3.4 x 1.0 x 1.5 cm.  No suspicious adnexal masses are seen; there is no evidence for  ovarian torsion.   No free fluid is seen within the pelvic cul-de-sac.   IMPRESSION:  1. Single intrauterine gestational sac noted, slightly irregular  in shape. This measures 1.7 cm in mean sac diameter,  compatible  with a gestational age of [redacted] weeks 4 days. This does not match the  gestational age of [redacted] weeks 2 days by LMP. The yolk sac and embryo  are not yet seen. Would perform follow-up pelvic ultrasound  in 1-2  weeks for further evaluation.  2. Moderate amount of subchorionic hemorrhage noted.   Original Report Authenticated By: Tonia Ghent, M.D.  MAU Course  Procedures None  MDM UA, Wet prep, GC/Chlamydia sent.  US performed  Assessment and Plan  A: IUGS, irregular shape. No yolk sac, fetal pole seen.   P: Discharge home Bleeding precautions discussed Patient recommended to take tylenol PRN for pain Patient will return in 1 week for follow-up US Patient may return to MAU sooner as needed or if condition worsens  Freddi Starr, PA-C 08/21/2012, 3:05 AM

## 2012-08-21 NOTE — MAU Note (Signed)
Pink spotting yesterday. Bright red bleeding with clots today. Lower abdominal cramping x2 months.

## 2012-08-22 ENCOUNTER — Inpatient Hospital Stay (HOSPITAL_COMMUNITY): Payer: Medicaid Other

## 2012-08-22 MED ORDER — OXYCODONE-ACETAMINOPHEN 5-325 MG PO TABS
1.0000 | ORAL_TABLET | Freq: Once | ORAL | Status: AC
  Administered 2012-08-22: 1 via ORAL
  Filled 2012-08-22: qty 1

## 2012-08-22 MED ORDER — OXYCODONE-ACETAMINOPHEN 5-325 MG PO TABS: 1.0000 | ORAL_TABLET | ORAL | Status: DC | PRN

## 2012-08-22 MED ORDER — IBUPROFEN 600 MG PO TABS: 600.0000 mg | ORAL_TABLET | Freq: Four times a day (QID) | ORAL | Status: DC | PRN

## 2012-08-22 MED ORDER — IBUPROFEN 600 MG PO TABS
600.0000 mg | ORAL_TABLET | Freq: Once | ORAL | Status: AC
  Administered 2012-08-22: 600 mg via ORAL
  Filled 2012-08-22: qty 1

## 2012-08-22 NOTE — MAU Provider Note (Signed)
History     CSN: [id number]  Arrival date and time: 08/21/12 2337   First Provider Initiated Contact with Patient 08/21/12 2356      Chief Complaint  Patient presents with  . Vaginal Bleeding   HPI This is a 22 y.o. female at [redacted]w[redacted]d who presents with heavier bleeding today. She was seen here yesterday and found to have a missed AB by ultrasound.  Yesterday's US showed:   IMPRESSION:  1. Single intrauterine gestational sac noted, slightly irregular  in shape. This measures 1.7 cm in mean sac diameter, compatible  with a gestational age of [redacted] weeks 4 days. This does not match the  gestational age of [redacted] weeks 2 days by LMP. The yolk sac and embryo  are not yet seen. Would perform follow-up pelvic ultrasound in 1-2  weeks for further evaluation.  2. Moderate amount of subchorionic hemorrhage noted.    RN Note: Pt states that she has been bleeding all day-it became much heavier about 2100  OB History    Grav Para Term Preterm Abortions TAB SAB Ect Mult Living   6 3 2 1 2 2  0 0 0 3      Past Medical History  Diagnosis Date  . Anemia   . Preterm labor   . Group B streptococcal infection     Past Surgical History  Procedure Date  . Dilation and curettage of uterus     Family History  Problem Relation Age of Onset  . Hearing loss Brother   . Asthma Son     History  Substance Use Topics  . Smoking status: Current Some Day Smoker -- 2.0 packs/day    Types: Cigarettes  . Smokeless tobacco: Never Used     Comment: pt stated she is trying to quite down to 1 ciggarette/day  . Alcohol Use: No    Allergies: No Known Allergies  Prescriptions prior to admission  Medication Sig Dispense Refill  . Prenatal Vit-Fe Fumarate-FA (PRENATAL MULTIVITAMIN) TABS Take 1 tablet by mouth daily.        Review of Systems  Constitutional: Negative for fever, chills and malaise/fatigue.  Gastrointestinal: Positive for abdominal pain. Negative for nausea, vomiting, diarrhea and  constipation.  Genitourinary: Negative for dysuria.  Neurological: Negative for weakness.  Endo/Heme/Allergies:       Vaginal bleeding, heavy   Physical Exam   Blood pressure 113/58, pulse 84, temperature 98.3 F (36.8 C), temperature source Oral, resp. rate 20, height 5\' 7"  (1.702 m), weight 82.555 kg (182 lb), last menstrual period 06/24/2012, SpO2 100.00%.  Physical Exam  Constitutional: She is oriented to person, place, and time. She appears well-developed and well-nourished. No distress (but crying).  Cardiovascular: Normal rate.   Respiratory: Effort normal.  GI: Soft. She exhibits no distension and no mass. There is tenderness (over uterus). There is no rebound and no guarding.  Genitourinary: Uterus normal. Vaginal discharge (Large clots removed.  Cervix open 1cm/long.  ) found.  Musculoskeletal: Normal range of motion.  Neurological: She is alert and oriented to person, place, and time.  Skin: Skin is warm and dry.  Psychiatric: She has a normal mood and affect.    MAU Course  Procedures  MDM US Ob Transvaginal  08/22/2012  *RADIOLOGY REPORT*  Clinical Data: Vaginal bleeding; question of missed abortion.  TRANSVAGINAL OBSTETRIC US  Technique:  Transvaginal ultrasound was performed for complete evaluation of the gestation as well as the maternal uterus, adnexal regions, and pelvic cul-de-sac.  Comparison:  Ultrasound  of pregnancy performed 08/21/2012  Intrauterine gestational sac: Seen at the level of the cervix; irregular in shape. Yolk sac: No Embryo: No Cardiac Activity: N/A  MSD: 1.2  cm  6    w 0    d  Subchorionic hemorrhage: No subchorionic hemorrhage is noted.  Maternal uterus/adnexae: The ovaries are not characterized.  The uterus is otherwise unremarkable in appearance.  No free fluid is seen in the pelvic cul-de-sac.  IMPRESSION: Since the prior study, the intrauterine gestational sac has progressed to the level of the cervix; it is irregular in shape. Findings are  compatible with spontaneous abortion in progress.   Original Report Authenticated By: Tonia Ghent, M.D.    Bleeding slowed after clots removed  Assessment and Plan  A:  SIUP at [redacted]w[redacted]d       Inevitable, incomplete abortion/miscarriage  P:  Discussed findings with patient who is grieving      Reviewed expectations of possibly several hours more of clots and cramps, which should subside once gestational sac moves out of cervix.       Rx Motrin and Percocet given for pain      Bleeding precautions.       Followup in clinic for postpartum care.  Patient states will probably want to contracept then.   [facility] 08/22/2012, 1:16 AM

## 2012-08-22 NOTE — MAU Provider Note (Signed)
Attestation of Attending Supervision of Advanced Practitioner (CNM/NP): Evaluation and management procedures were performed by the Advanced Practitioner under my supervision and collaboration.  I have reviewed the Advanced Practitioner's note and chart, and I agree with the management and plan.  HARRAWAY-SMITH, Tonianne Fine 6:31 AM

## 2012-08-25 ENCOUNTER — Other Ambulatory Visit: Payer: Self-pay | Admitting: Advanced Practice Midwife

## 2012-08-28 ENCOUNTER — Ambulatory Visit (HOSPITAL_COMMUNITY): Payer: Medicaid Other

## 2012-09-18 ENCOUNTER — Other Ambulatory Visit (HOSPITAL_COMMUNITY)
Admission: RE | Admit: 2012-09-18 | Discharge: 2012-09-18 | Disposition: A | Discharge status: Home / Self Care | Payer: Medicaid Other | Source: Ambulatory Visit | Attending: Medical | Admitting: Medical

## 2012-09-18 ENCOUNTER — Ambulatory Visit (INDEPENDENT_AMBULATORY_CARE_PROVIDER_SITE_OTHER): Payer: Medicaid Other | Admitting: Medical

## 2012-09-18 ENCOUNTER — Encounter: Payer: Self-pay | Admitting: Medical

## 2012-09-18 VITALS — BP 126/75 | HR 96 | Ht 67.0 in | Wt 184.0 lb

## 2012-09-18 DIAGNOSIS — Z01419 Encounter for gynecological examination (general) (routine) without abnormal findings: Secondary | ICD-10-CM | POA: Insufficient documentation

## 2012-09-18 DIAGNOSIS — R87810 Cervical high risk human papillomavirus (HPV) DNA test positive: Secondary | ICD-10-CM | POA: Insufficient documentation

## 2012-09-18 DIAGNOSIS — Z87898 Personal history of other specified conditions: Secondary | ICD-10-CM

## 2012-09-18 DIAGNOSIS — O039 Complete or unspecified spontaneous abortion without complication: Secondary | ICD-10-CM

## 2012-09-18 DIAGNOSIS — Z8742 Personal history of other diseases of the female genital tract: Secondary | ICD-10-CM

## 2012-09-18 DIAGNOSIS — Z1151 Encounter for screening for human papillomavirus (HPV): Secondary | ICD-10-CM | POA: Insufficient documentation

## 2012-09-18 LAB — HCG, QUANTITATIVE, PREGNANCY: hCG, Beta Chain, Quant, S: <2 m[IU]/mL

## 2012-09-18 LAB — POCT PREGNANCY, URINE: Preg Test, Ur: NEGATIVE

## 2012-09-18 NOTE — Progress Notes (Signed)
Patient ID: Stacey Carrillo, female   DOB: 03-18-1991, 22 y.o.   MRN: [id number]  History:  Stacey Carrillo  is a 22 y.o. [id number] who presents to clinic today for follow-up after SAB. The patient was seen in MAU for SAB on 08/22/12. She had bleeding and cramping following that visit, but has not had any bleeding or pain x ~3 weeks. The patient has been sexually active since the SAB and is not using protection. She uses the coitus interruptus method. She does not desire pregnancy at this time and would like to start depo provera for birth control. The patient also states that she had an abnormal pap during her last pregnancy when she was followed at [facility]. The patient states that she had a Colpo and then was not given instructions to follow-up. Her last pap smear was ASCUS + HPV in 01/2012.   The following portions of the patient's history were reviewed and updated as appropriate: allergies, current medications, past family history, past medical history, past social history, past surgical history and problem list.  Review of Systems:  Pertinent items are noted in HPI.  Objective:  Physical Exam BP 126/75  Pulse 96  Ht 5\' 7"  (1.702 m)  Wt 184 lb (83.462 kg)  BMI 28.81 kg/m2  LMP 06/24/2012  Breastfeeding? Unknown GENERAL: Well-developed, well-nourished female in no acute distress.  HEENT: Normocephalic, atraumatic.  LUNGS: Normal rate. Clear to auscultation bilaterally.  HEART: Regular rate and rhythm with no adventitious sounds.  ABDOMEN: Soft, nontender, nondistended. No organomegaly. Normal bowel sounds appreciated in all quadrants.  PELVIC: Normal external female genitalia. Vagina is pink and rugated.  Normal discharge. Normal cervix contour. Pap smear obtained. Uterus is normal in size. No adnexal mass or tenderness.  EXTREMITIES: No cyanosis, clubbing, or edema.   Labs and Imaging UPT - negative today Quant hCG drawn  Assessment & Plan:  Assessment: S/P SAB Undesired  fertility  Plans: Quant hCG today to ensure levels are < 5 UPT today and in 2 weeks with abstinence in between. If negative in 2 weeks patient may start depo provera for birth control Patient will receive a letter if pap smear is normal. Will call with abnormal results.  Patient may return to clinic as needed or in 2 weeks for UPT and Depo

## 2012-09-18 NOTE — Patient Instructions (Addendum)

## 2012-09-25 ENCOUNTER — Telehealth: Payer: Self-pay | Admitting: General Practice

## 2012-09-25 NOTE — Telephone Encounter (Signed)
Called patient and informed her of results & recommendations and explained significance to patient. Patient verbalized understanding and had no further questions.

## 2012-09-25 NOTE — Telephone Encounter (Signed)
Message copied by Kathee Delton on Mon Sep 25, 2012  2:41 PM ------      Message from: Freddi Starr      Created: Sun Sep 24, 2012  8:49 PM       Patient had ascus pap with +HPV because she is < 23 years old, new guidelines say to repeat pap with cotesting in 1 year. Please inform patient of these results and recommendations for follow-up testing. Thanks! ------

## 2012-10-02 ENCOUNTER — Encounter: Payer: Self-pay | Admitting: Obstetrics & Gynecology

## 2012-10-02 ENCOUNTER — Ambulatory Visit: Payer: Medicaid Other

## 2013-01-10 ENCOUNTER — Inpatient Hospital Stay (HOSPITAL_COMMUNITY): Payer: Medicaid Other

## 2013-01-10 ENCOUNTER — Encounter (HOSPITAL_COMMUNITY): Payer: Self-pay | Admitting: *Deleted

## 2013-01-10 ENCOUNTER — Inpatient Hospital Stay (HOSPITAL_COMMUNITY)
Admission: AD | Admit: 2013-01-10 | Discharge: 2013-01-10 | Disposition: A | Discharge status: Home / Self Care | Payer: Medicaid Other | Source: Ambulatory Visit | Attending: Obstetrics & Gynecology | Admitting: Obstetrics & Gynecology

## 2013-01-10 DIAGNOSIS — M549 Dorsalgia, unspecified: Secondary | ICD-10-CM

## 2013-01-10 DIAGNOSIS — O99891 Other specified diseases and conditions complicating pregnancy: Secondary | ICD-10-CM | POA: Insufficient documentation

## 2013-01-10 DIAGNOSIS — Z3490 Encounter for supervision of normal pregnancy, unspecified, unspecified trimester: Secondary | ICD-10-CM

## 2013-01-10 DIAGNOSIS — R109 Unspecified abdominal pain: Secondary | ICD-10-CM | POA: Insufficient documentation

## 2013-01-10 DIAGNOSIS — Z3201 Encounter for pregnancy test, result positive: Secondary | ICD-10-CM

## 2013-01-10 DIAGNOSIS — M545 Low back pain, unspecified: Secondary | ICD-10-CM | POA: Insufficient documentation

## 2013-01-10 HISTORY — DX: Reserved for concepts with insufficient information to code with codable children: IMO0002

## 2013-01-10 HISTORY — DX: Unspecified abnormal cytological findings in specimens from cervix uteri: R87.619

## 2013-01-10 HISTORY — DX: Papillomavirus as the cause of diseases classified elsewhere: B97.7

## 2013-01-10 LAB — URINALYSIS, ROUTINE W REFLEX MICROSCOPIC
Leukocytes, UA: NEGATIVE
Nitrite: NEGATIVE
Specific Gravity, Urine: >1.03 — ABNORMAL HIGH (ref 1.005–1.030)
Urobilinogen, UA: 0.2 mg/dL (ref 0.0–1.0)

## 2013-01-10 LAB — CBC
MCH: 25.5 pg — ABNORMAL LOW (ref 26.0–34.0)
MCHC: 33.7 g/dL (ref 30.0–36.0)
Platelets: 188 10*3/uL (ref 150–400)
RDW: 14.5 % (ref 11.5–15.5)

## 2013-01-10 LAB — WET PREP, GENITAL
Trich, Wet Prep: NONE SEEN
Yeast Wet Prep HPF POC: NONE SEEN

## 2013-01-10 LAB — POCT PREGNANCY, URINE: Preg Test, Ur: POSITIVE — AB

## 2013-01-10 LAB — HCG, QUANTITATIVE, PREGNANCY: hCG, Beta Chain, Quant, S: 52678 m[IU]/mL — ABNORMAL HIGH (ref <5)

## 2013-01-10 NOTE — MAU Provider Note (Signed)
Attestation of Attending Supervision of Advanced Practitioner (CNM/NP): Evaluation and management procedures were performed by the Advanced Practitioner under my supervision and collaboration.  I have reviewed the Advanced Practitioner's note and chart, and I agree with the management and plan.  HARRAWAY-SMITH, Xzaria Teo 2:06 PM

## 2013-01-10 NOTE — MAU Note (Signed)
Cramping across lower abdomen with occasional sharp pains in upper R side X 2 wks. Mucous D/C, but no bleeding for a month. Feels sort of like when she miscarried back in February.

## 2013-01-10 NOTE — MAU Provider Note (Signed)
History     CSN: [id number]  Arrival date and time: 01/10/13 4010   First Provider Initiated Contact with Patient 01/10/13 1047      Chief Complaint  Patient presents with  . Abdominal Pain   HPI Ms. Kymberlee Viger is a 22 y.o. [phone] at [redacted]w[redacted]d who presents to MAU today with complaint of occasional sharp lower back pains and mid abdominal cramping x 2 weeks. Patient is unsure of LMP but thinks it might have been the end of April. She is having a small amount of milky, mucus discharge. She has also been having some mild fatigue and occasional headaches. She denies vaginal bleeding, N/V, dizziness or weakness.   OB History   Grav Para Term Preterm Abortions TAB SAB Ect Mult Living   7 3 2 1 3 2 1  0 0 3      Past Medical History  Diagnosis Date  . Anemia   . Preterm labor   . Group B streptococcal infection   . HPV (human papilloma virus) infection   . Abnormal Pap smear     Past Surgical History  Procedure Laterality Date  . Dilation and curettage of uterus      Family History  Problem Relation Age of Onset  . Hearing loss Brother   . Asthma Son     History  Substance Use Topics  . Smoking status: Current Some Day Smoker -- 0.25 packs/day    Types: Cigarettes  . Smokeless tobacco: Never Used     Comment: pt stated she is trying to quite down to 1 ciggarette/day  . Alcohol Use: Yes     Comment: social when not pregnant    Allergies: No Known Allergies  Prescriptions prior to admission  Medication Sig Dispense Refill  . oxyCODONE-acetaminophen (ROXICET) 5-325 MG per tablet Take 1 tablet by mouth every 4 (four) hours as needed for pain.  30 tablet  0  . [DISCONTINUED] ibuprofen (ADVIL,MOTRIN) 600 MG tablet Take 1 tablet (600 mg total) by mouth every 6 (six) hours as needed for pain.  30 tablet  0    Review of Systems  Constitutional: Positive for malaise/fatigue. Negative for fever.  Gastrointestinal: Positive for abdominal pain. Negative for nausea, vomiting,  diarrhea and constipation.  Genitourinary: Negative for dysuria, urgency and frequency.       Neg - vaginal bleeding + discharge  Musculoskeletal: Positive for back pain.  Neurological: Negative for dizziness, loss of consciousness and weakness.   Physical Exam   Blood pressure 122/68, pulse 94, temperature 97.4 F (36.3 C), temperature source Oral, resp. rate 18, height 5\' 7"  (1.702 m), weight 162 lb (73.483 kg), last menstrual period 11/23/2012.  Physical Exam  Constitutional: She is oriented to person, place, and time. She appears well-developed and well-nourished. No distress.  HENT:  Head: Normocephalic and atraumatic.  Cardiovascular: Normal rate, regular rhythm and normal heart sounds.   Respiratory: Effort normal and breath sounds normal. No respiratory distress.  GI: Soft. Bowel sounds are normal. She exhibits no distension and no mass. There is no tenderness. There is no rebound and no guarding.  Genitourinary: Uterus is enlarged. Uterus is not tender. Cervix exhibits friability. Cervix exhibits no motion tenderness and no discharge. Right adnexum displays no mass and no tenderness. Left adnexum displays no mass and no tenderness. No bleeding around the vagina. Vaginal discharge (scant mucus discharge) found.  Neurological: She is alert and oriented to person, place, and time.  Skin: Skin is warm and dry. No erythema.  Psychiatric: She has a normal mood and affect.   Results for orders placed during the hospital encounter of 01/10/13 (from the past 24 hour(s))  URINALYSIS, ROUTINE W REFLEX MICROSCOPIC     Status: Abnormal   Collection Time    01/10/13  9:49 AM      Result Value Range   Color, Urine YELLOW  YELLOW   APPearance CLEAR  CLEAR   Specific Gravity, Urine >1.030 (*) 1.005 - 1.030   pH 5.5  5.0 - 8.0   Glucose, UA NEGATIVE  NEGATIVE mg/dL   Hgb urine dipstick NEGATIVE  NEGATIVE   Bilirubin Urine NEGATIVE  NEGATIVE   Ketones, ur NEGATIVE  NEGATIVE mg/dL    Protein, ur NEGATIVE  NEGATIVE mg/dL   Urobilinogen, UA 0.2  0.0 - 1.0 mg/dL   Nitrite NEGATIVE  NEGATIVE   Leukocytes, UA NEGATIVE  NEGATIVE  POCT PREGNANCY, URINE     Status: Abnormal   Collection Time    01/10/13  9:58 AM      Result Value Range   Preg Test, Ur POSITIVE (*) NEGATIVE  HCG, QUANTITATIVE, PREGNANCY     Status: Abnormal   Collection Time    01/10/13 10:52 AM      Result Value Range   hCG, Beta Chain, [place] [address] (*) <5 mIU/mL  CBC     Status: Abnormal   Collection Time    01/10/13 10:52 AM      Result Value Range   WBC 9.2  4.0 - 10.5 K/uL   RBC 4.95  3.87 - 5.11 MIL/uL   Hemoglobin 12.6  12.0 - 15.0 g/dL   HCT 60.4  54.0 - 98.1 %   MCV 75.6 (*) 78.0 - 100.0 fL   MCH 25.5 (*) 26.0 - 34.0 pg   MCHC 33.7  30.0 - 36.0 g/dL   RDW 19.1  47.8 - 29.5 %   Platelets 188  150 - 400 K/uL  WET PREP, GENITAL     Status: Abnormal   Collection Time    01/10/13 11:13 AM      Result Value Range   Yeast Wet Prep HPF POC NONE SEEN  NONE SEEN   Trich, Wet Prep NONE SEEN  NONE SEEN   Clue Cells Wet Prep HPF POC NONE SEEN  NONE SEEN   WBC, Wet Prep HPF POC FEW (*) NONE SEEN    MAU Course  Procedures None  MDM + UPT UA, Wet prep, GC/Chlamydia, CBC, quant hCG and [place] today  Assessment and Plan  A: IUP at 12w 4d Back pain in pregnancy  P: Discharge home Patient instructed to take tylenol PRN for back pain/headaches and get abdominal binder Patient plans to go to [facility] or [place] Patient may return to MAU as needed or if her condition were to change or worsen  Freddi Starr, PA-C  01/10/2013, 1:15 PM

## 2013-01-12 LAB — GC/CHLAMYDIA PROBE AMP: CT Probe RNA: NEGATIVE

## 2013-01-24 ENCOUNTER — Ambulatory Visit (INDEPENDENT_AMBULATORY_CARE_PROVIDER_SITE_OTHER): Payer: Medicaid Other | Admitting: *Deleted

## 2013-01-24 ENCOUNTER — Encounter: Payer: Self-pay | Admitting: *Deleted

## 2013-01-24 VITALS — BP 132/82 | Wt 183.0 lb

## 2013-01-24 DIAGNOSIS — Z3491 Encounter for supervision of normal pregnancy, unspecified, first trimester: Secondary | ICD-10-CM

## 2013-01-24 DIAGNOSIS — Z3480 Encounter for supervision of other normal pregnancy, unspecified trimester: Secondary | ICD-10-CM

## 2013-01-24 NOTE — Progress Notes (Signed)
P = 91 

## 2013-01-25 LAB — OBSTETRIC PANEL
Antibody Screen: NEGATIVE
Basophils Relative: 0 % (ref 0–1)
Eosinophils Absolute: 0.1 10*3/uL (ref 0.0–0.7)
HCT: 36.6 % (ref 36.0–46.0)
Hemoglobin: 12.5 g/dL (ref 12.0–15.0)
Lymphs Abs: 1.8 10*3/uL (ref 0.7–4.0)
MCH: 25.1 pg — ABNORMAL LOW (ref 26.0–34.0)
MCHC: 34.2 g/dL (ref 30.0–36.0)
Monocytes Absolute: 0.4 10*3/uL (ref 0.1–1.0)
Monocytes Relative: 4 % (ref 3–12)
Neutrophils Relative %: 74 % (ref 43–77)
RBC: 4.98 MIL/uL (ref 3.87–5.11)
Rh Type: POSITIVE

## 2013-01-25 LAB — RPR

## 2013-01-25 LAB — HEPATITIS B SURF AG CONFIRMATION: Hepatitis B Surf Ag Confirmation: POSITIVE — AB

## 2013-01-29 LAB — CYSTIC FIBROSIS DIAGNOSTIC STUDY

## 2013-02-06 ENCOUNTER — Ambulatory Visit (INDEPENDENT_AMBULATORY_CARE_PROVIDER_SITE_OTHER): Payer: Medicaid Other | Admitting: Obstetrics & Gynecology

## 2013-02-06 ENCOUNTER — Other Ambulatory Visit: Payer: Self-pay | Admitting: Obstetrics & Gynecology

## 2013-02-06 ENCOUNTER — Encounter: Payer: Self-pay | Admitting: Obstetrics & Gynecology

## 2013-02-06 ENCOUNTER — Other Ambulatory Visit (HOSPITAL_COMMUNITY)
Admission: RE | Admit: 2013-02-06 | Discharge: 2013-02-06 | Disposition: A | Discharge status: Home / Self Care | Payer: Medicaid Other | Source: Ambulatory Visit | Attending: Obstetrics & Gynecology | Admitting: Obstetrics & Gynecology

## 2013-02-06 VITALS — BP 120/75 | Wt 186.0 lb

## 2013-02-06 DIAGNOSIS — Z3480 Encounter for supervision of other normal pregnancy, unspecified trimester: Secondary | ICD-10-CM

## 2013-02-06 DIAGNOSIS — B181 Chronic viral hepatitis B without delta-agent: Secondary | ICD-10-CM

## 2013-02-06 DIAGNOSIS — Z113 Encounter for screening for infections with a predominantly sexual mode of transmission: Secondary | ICD-10-CM | POA: Insufficient documentation

## 2013-02-06 DIAGNOSIS — Z3482 Encounter for supervision of other normal pregnancy, second trimester: Secondary | ICD-10-CM

## 2013-02-06 DIAGNOSIS — Z01419 Encounter for gynecological examination (general) (routine) without abnormal findings: Secondary | ICD-10-CM | POA: Insufficient documentation

## 2013-02-06 DIAGNOSIS — R6889 Other general symptoms and signs: Secondary | ICD-10-CM

## 2013-02-06 DIAGNOSIS — IMO0002 Reserved for concepts with insufficient information to code with codable children: Secondary | ICD-10-CM

## 2013-02-06 MED ORDER — PRENATAL VITAMINS 0.8 MG PO TABS: 1.0000 | ORAL_TABLET | Freq: Every day | ORAL | Status: DC

## 2013-02-06 NOTE — Progress Notes (Signed)
P-82.  Pt needs a rx sent in for prenatal vitamins.

## 2013-02-06 NOTE — Progress Notes (Signed)
P -  Pt here for first OB visit with physician. Pt came in to office 2 weeks ago for prenatal labs - need to be reviewed today

## 2013-02-06 NOTE — Progress Notes (Signed)
  Subjective:    Stacey Carrillo is being seen today for her first obstetrical visit.  This is not a planned pregnancy, last baby is 36 months old, and patient did not return for scheduled Nexplanon insertion after her last pregnancy. She was not using any contraception.  She is at [redacted]w[redacted]d gestation dated by 12 week ultrasound not consistent with LMP. Her obstetrical history is significant for three term SVDs, largest 8lbs 11 oz. Relationship with FOB: spouse, living together. Patient does intend to breast feed. Pregnancy history fully reviewed.  Patient reports no complaints.  Past Medical History  Diagnosis Date  . Anemia   . Preterm labor   . Group B streptococcal infection   . HPV (human papilloma virus) infection   . Abnormal Pap smear    Past Surgical History  Procedure Laterality Date  . Dilation and curettage of uterus     Family History  Problem Relation Age of Onset  . Hearing loss Brother   . Asthma Son     Review of Systems:   Review of Systems  Negative  Objective:     BP 120/75  Wt 186 lb (84.369 kg)  BMI 29.12 kg/m2  LMP 11/23/2012 Physical Exam  Exam  Uterus:     Pelvic Exam:    Perineum: No Hemorrhoids, Normal Perineum   Vulva: normal   Vagina:  normal mucosa, normal discharge   Cervix: normal   Adnexa: normal adnexa and no mass, fullness, tenderness   Bony Pelvis: gynecoid  System: Breast:  normal appearance, no masses or tenderness   Skin: normal coloration and turgor, no rashes   Neurologic: normal   Extremities: normal strength, tone, and muscle mass   HEENT PERRLA   Mouth/Teeth mucous membranes moist, pharynx normal without lesions and dental hygiene good   Neck supple and no masses   Cardiovascular: regular rate and rhythm   Respiratory:  appears well, vitals normal, no respiratory distress, acyanotic, normal RR, chest clear, no wheezing, crepitations, rhonchi, normal symmetric air entry   Abdomen: soft, non-tender; bowel sounds normal; no  masses,  no organomegaly   Urinary: urethral meatus normal      Assessment:    Pregnancy: [id number] Patient Active Problem List   Diagnosis Date Noted  . ASCUS with positive high risk HPV 01/11/2012  . Supervision of other normal pregnancy 08/23/2011  . GBS (group B streptococcus) UTI complicating pregnancy 05/18/2011  . Hepatitis B carrier 05/18/2011    Plan:   Initial labs reviewed and were remarkable for +Hep B Sag; CMET and HBV DNA ordered. Prenatal vitamins refilled Problem list reviewed and updated. Genetic Screening discussed Quad Screen: ordered. Ultrasound discussed; fetal survey: ordered. Follow up in 4 weeks.    Dianely Krehbiel A 02/06/2013

## 2013-02-06 NOTE — Patient Instructions (Signed)
Pregnancy - Second Trimester The second trimester of pregnancy (3 to 6 months) is a period of rapid growth for you and your baby. At the end of the sixth month, your baby is about 9 inches long and weighs 1 1/2 pounds. You will begin to feel the baby move between 18 and 20 weeks of the pregnancy. This is called quickening. Weight gain is faster. A clear fluid (colostrum) may leak out of your breasts. You may feel small contractions of the womb (uterus). This is known as false labor or Braxton-Hicks contractions. This is like a practice for labor when the baby is ready to be born. Usually, the problems with morning sickness have usually passed by the end of your first trimester. Some women develop small dark blotches (called cholasma, mask of pregnancy) on their face that usually goes away after the baby is born. Exposure to the sun makes the blotches worse. Acne may also develop in some pregnant women and pregnant women who have acne, may find that it goes away. PRENATAL EXAMS  Blood work may continue to be done during prenatal exams. These tests are done to check on your health and the probable health of your baby. Blood work is used to follow your blood levels (hemoglobin). Anemia (low hemoglobin) is common during pregnancy. Iron and vitamins are given to help prevent this. You will also be checked for diabetes between 24 and 28 weeks of the pregnancy. Some of the previous blood tests may be repeated.  The size of the uterus is measured during each visit. This is to make sure that the baby is continuing to grow properly according to the dates of the pregnancy.  Your blood pressure is checked every prenatal visit. This is to make sure you are not getting toxemia.  Your urine is checked to make sure you do not have an infection, diabetes or protein in the urine.  Your weight is checked often to make sure gains are happening at the suggested rate. This is to ensure that both you and your baby are  growing normally.  Sometimes, an ultrasound is performed to confirm the proper growth and development of the baby. This is a test which bounces harmless sound waves off the baby so your caregiver can more accurately determine due dates. Sometimes, a test is done on the amniotic fluid surrounding the baby. This test is called an amniocentesis. The amniotic fluid is obtained by sticking a needle into the belly (abdomen). This is done to check the chromosomes in instances where there is a concern about possible genetic problems with the baby. It is also sometimes done near the end of pregnancy if an early delivery is required. In this case, it is done to help make sure the baby's lungs are mature enough for the baby to live outside of the womb. CHANGES OCCURING IN THE SECOND TRIMESTER OF PREGNANCY Your body goes through many changes during pregnancy. They vary from person to person. Talk to your caregiver about changes you notice that you are concerned about.  During the second trimester, you will likely have an increase in your appetite. It is normal to have cravings for certain foods. This varies from person to person and pregnancy to pregnancy.  Your lower abdomen will begin to bulge.  You may have to urinate more often because the uterus and baby are pressing on your bladder. It is also common to get more bladder infections during pregnancy. You can help this by drinking lots of fluids   and emptying your bladder before and after intercourse.  You may begin to get stretch marks on your hips, abdomen, and breasts. These are normal changes in the body during pregnancy. There are no exercises or medicines to take that prevent this change.  You may begin to develop swollen and bulging veins (varicose veins) in your legs. Wearing support hose, elevating your feet for 15 minutes, 3 to 4 times a day and limiting salt in your diet helps lessen the problem.  Heartburn may develop as the uterus grows and  pushes up against the stomach. Antacids recommended by your caregiver helps with this problem. Also, eating smaller meals 4 to 5 times a day helps.  Constipation can be treated with a stool softener or adding bulk to your diet. Drinking lots of fluids, and eating vegetables, fruits, and whole grains are helpful.  Exercising is also helpful. If you have been very active up until your pregnancy, most of these activities can be continued during your pregnancy. If you have been less active, it is helpful to start an exercise program such as walking.  Hemorrhoids may develop at the end of the second trimester. Warm sitz baths and hemorrhoid cream recommended by your caregiver helps hemorrhoid problems.  Backaches may develop during this time of your pregnancy. Avoid heavy lifting, wear low heal shoes, and practice good posture to help with backache problems.  Some pregnant women develop tingling and numbness of their hand and fingers because of swelling and tightening of ligaments in the wrist (carpel tunnel syndrome). This goes away after the baby is born.  As your breasts enlarge, you may have to get a bigger bra. Get a comfortable, cotton, support bra. Do not get a nursing bra until the last month of the pregnancy if you will be nursing the baby.  You may get a dark line from your belly button to the pubic area called the linea nigra.  You may develop rosy cheeks because of increase blood flow to the face.  You may develop spider looking lines of the face, neck, arms, and chest. These go away after the baby is born. HOME CARE INSTRUCTIONS   It is extremely important to avoid all smoking, herbs, alcohol, and unprescribed drugs during your pregnancy. These chemicals affect the formation and growth of the baby. Avoid these chemicals throughout the pregnancy to ensure the delivery of a healthy infant.  Most of your home care instructions are the same as suggested for the first trimester of your  pregnancy. Keep your caregiver's appointments. Follow your caregiver's instructions regarding medicine use, exercise, and diet.  During pregnancy, you are providing food for you and your baby. Continue to eat regular, well-balanced meals. Choose foods such as meat, fish, milk and other low fat dairy products, vegetables, fruits, and whole-grain breads and cereals. Your caregiver will tell you of the ideal weight gain.  A physical sexual relationship may be continued up until near the end of pregnancy if there are no other problems. Problems could include early (premature) leaking of amniotic fluid from the membranes, vaginal bleeding, abdominal pain, or other medical or pregnancy problems.  Exercise regularly if there are no restrictions. Check with your caregiver if you are unsure of the safety of some of your exercises. The greatest weight gain will occur in the last 2 trimesters of pregnancy. Exercise will help you:  Control your weight.  Get you in shape for labor and delivery.  Lose weight after you have the baby.  Wear   a good support or jogging bra for breast tenderness during pregnancy. This may help if worn during sleep. Pads or tissues may be used in the bra if you are leaking colostrum.  Do not use hot tubs, steam rooms or saunas throughout the pregnancy.  Wear your seat belt at all times when driving. This protects you and your baby if you are in an accident.  Avoid raw meat, uncooked cheese, cat litter boxes, and soil used by cats. These carry germs that can cause birth defects in the baby.  The second trimester is also a good time to visit your dentist for your dental health if this has not been done yet. Getting your teeth cleaned is okay. Use a soft toothbrush. Brush gently during pregnancy.  It is easier to leak urine during pregnancy. Tightening up and strengthening the pelvic muscles will help with this problem. Practice stopping your urination while you are going to the  bathroom. These are the same muscles you need to strengthen. It is also the muscles you would use as if you were trying to stop from passing gas. You can practice tightening these muscles up 10 times a set and repeating this about 3 times per day. Once you know what muscles to tighten up, do not perform these exercises during urination. It is more likely to contribute to an infection by backing up the urine.  Ask for help if you have financial, counseling, or nutritional needs during pregnancy. Your caregiver will be able to offer counseling for these needs as well as refer you for other special needs.  Your skin may become oily. If so, wash your face with mild soap, use non-greasy moisturizer and oil or cream based makeup. MEDICINES AND DRUG USE IN PREGNANCY  Take prenatal vitamins as directed. The vitamin should contain 1 milligram of folic acid. Keep all vitamins out of reach of children. Only a couple vitamins or tablets containing iron may be fatal to a baby or young child when ingested.  Avoid use of all medicines, including herbs, over-the-counter medicines, not prescribed or suggested by your caregiver. Only take over-the-counter or prescription medicines for pain, discomfort, or fever as directed by your caregiver. Do not use aspirin.  Let your caregiver also know about herbs you may be using.  Alcohol is related to a number of birth defects. This includes fetal alcohol syndrome. All alcohol, in any form, should be avoided completely. Smoking will cause low birth rate and premature babies.  Street or illegal drugs are very harmful to the baby. They are absolutely forbidden. A baby born to an addicted mother will be addicted at birth. The baby will go through the same withdrawal an adult does. SEEK MEDICAL CARE IF:  You have any concerns or worries during your pregnancy. It is better to call with your questions if you feel they cannot wait, rather than worry about them. SEEK IMMEDIATE  MEDICAL CARE IF:   An unexplained oral temperature above 102 F (38.9 C) develops, or as your caregiver suggests.  You have leaking of fluid from the vagina (birth canal). If leaking membranes are suspected, take your temperature and tell your caregiver of this when you call.  There is vaginal spotting, bleeding, or passing clots. Tell your caregiver of the amount and how many pads are used. Light spotting in pregnancy is common, especially following intercourse.  You develop a bad smelling vaginal discharge with a change in the color from clear to white.  You continue to feel   sick to your stomach (nauseated) and have no relief from remedies suggested. You vomit blood or coffee ground-like materials.  You lose more than 2 pounds of weight or gain more than 2 pounds of weight over 1 week, or as suggested by your caregiver.  You notice swelling of your face, hands, feet, or legs.  You get exposed to German measles and have never had them.  You are exposed to fifth disease or chickenpox.  You develop belly (abdominal) pain. Round ligament discomfort is a common non-cancerous (benign) cause of abdominal pain in pregnancy. Your caregiver still must evaluate you.  You develop a bad headache that does not go away.  You develop fever, diarrhea, pain with urination, or shortness of breath.  You develop visual problems, blurry, or double vision.  You fall or are in a car accident or any kind of trauma.  There is mental or physical violence at home. Document Released: 07/13/2001 Document Revised: 04/12/2012 Document Reviewed: 01/15/2009 [employer] Patient Information 2014 [employer].  

## 2013-02-07 ENCOUNTER — Encounter: Payer: Self-pay | Admitting: Obstetrics & Gynecology

## 2013-02-07 LAB — COMPREHENSIVE METABOLIC PANEL
BUN: 6 mg/dL (ref 6–23)
CO2: 16 mEq/L — ABNORMAL LOW (ref 19–32)
Calcium: 9.4 mg/dL (ref 8.4–10.5)
Chloride: 104 mEq/L (ref 96–112)
Creat: 0.42 mg/dL — ABNORMAL LOW (ref 0.50–1.10)
Total Bilirubin: 0.3 mg/dL (ref 0.3–1.2)

## 2013-02-07 LAB — ALPHA FETOPROTEIN, MATERNAL
Open Spina bifida: NEGATIVE
Osb Risk: <1:27300 {titer}

## 2013-02-08 LAB — HEPATITIS B DNA, ULTRAQUANTITATIVE, PCR
Hepatitis B DNA (Calc): 2293 copies/mL — ABNORMAL HIGH (ref <116)
Hepatitis B DNA: 394 IU/mL — ABNORMAL HIGH (ref <20)

## 2013-02-09 ENCOUNTER — Encounter: Payer: Self-pay | Admitting: Obstetrics & Gynecology

## 2013-02-09 DIAGNOSIS — B181 Chronic viral hepatitis B without delta-agent: Secondary | ICD-10-CM | POA: Insufficient documentation

## 2013-02-09 DIAGNOSIS — O98419 Viral hepatitis complicating pregnancy, unspecified trimester: Secondary | ICD-10-CM

## 2013-02-09 NOTE — Progress Notes (Signed)
Quick Note:  Hep B DNA is elevated (394 IU/mL or 2293 copies/mL; normal range is <20 IU/mL or <116 copies/mL); despite normal LFTs.  GI on call (Dr. Loreta Ave) curbsided and feels that patient does not need any acute intervention given that the viral load counts are low and this is consistent with patient's chronic Hepatitis B carrier status. The infant will need to vaccinated after birth as usual. Reevaluate viral load if LFTs become elevated.  ______

## 2013-02-13 LAB — AFP, QUAD SCREEN
AFP: 19.5 IU/mL
Age Alone: 1:1100 {titer}
HCG, Total: 20465 m[IU]/mL
Interpretation-AFP: NEGATIVE
MoM for AFP: 0.73
Open Spina bifida: NEGATIVE
Tri 18 Scr Risk Est: NEGATIVE
uE3 Mom: 0.7
uE3 Value: 0.4 ng/mL

## 2013-02-14 ENCOUNTER — Encounter: Payer: Self-pay | Admitting: Obstetrics & Gynecology

## 2013-02-21 ENCOUNTER — Ambulatory Visit (HOSPITAL_COMMUNITY)
Admission: RE | Admit: 2013-02-21 | Discharge: 2013-02-21 | Disposition: A | Discharge status: Home / Self Care | Payer: Medicaid Other | Source: Ambulatory Visit | Attending: Obstetrics & Gynecology | Admitting: Obstetrics & Gynecology

## 2013-02-21 DIAGNOSIS — Z1389 Encounter for screening for other disorder: Secondary | ICD-10-CM | POA: Insufficient documentation

## 2013-02-21 DIAGNOSIS — O358XX0 Maternal care for other (suspected) fetal abnormality and damage, not applicable or unspecified: Secondary | ICD-10-CM | POA: Insufficient documentation

## 2013-02-21 DIAGNOSIS — Z3482 Encounter for supervision of other normal pregnancy, second trimester: Secondary | ICD-10-CM

## 2013-02-21 DIAGNOSIS — Z363 Encounter for antenatal screening for malformations: Secondary | ICD-10-CM | POA: Insufficient documentation

## 2013-02-21 DIAGNOSIS — B181 Chronic viral hepatitis B without delta-agent: Secondary | ICD-10-CM

## 2013-02-22 ENCOUNTER — Encounter: Payer: Self-pay | Admitting: Obstetrics & Gynecology

## 2013-03-06 ENCOUNTER — Ambulatory Visit (INDEPENDENT_AMBULATORY_CARE_PROVIDER_SITE_OTHER): Payer: Medicaid Other | Admitting: Family Medicine

## 2013-03-06 ENCOUNTER — Encounter: Payer: Self-pay | Admitting: Family Medicine

## 2013-03-06 VITALS — BP 122/75 | Wt 187.0 lb

## 2013-03-06 DIAGNOSIS — Z3480 Encounter for supervision of other normal pregnancy, unspecified trimester: Secondary | ICD-10-CM

## 2013-03-06 DIAGNOSIS — Z3482 Encounter for supervision of other normal pregnancy, second trimester: Secondary | ICD-10-CM

## 2013-03-06 NOTE — Patient Instructions (Addendum)
Round Ligament Pain The round ligament is made up of muscle and fibrous tissue. It is attached to the uterus near the fallopian tube. The round ligament is located on both sides of the uterus and helps support the position of the uterus. It usually begins in the second trimester of pregnancy when the uterus comes out of the pelvis. The pain can come and go until the baby is delivered. Round ligament pain is not a serious problem and does not cause harm to the baby. CAUSE During pregnancy the uterus grows the most from the second trimester to delivery. As it grows, it stretches and slightly twists the round ligaments. When the uterus leans from one side to the other, the round ligament on the opposite side pulls and stretches. This can cause pain. SYMPTOMS  Pain can occur on one side or both sides. The pain is usually a short, sharp, and pinching-like. Sometimes it can be a dull, lingering and aching pain. The pain is located in the lower side of the abdomen or in the groin. The pain is internal and usually starts deep in the groin and moves up to the outside of the hip area. Pain can occur with:  Sudden change in position like getting out of bed or a chair.  Rolling over in bed.  Coughing or sneezing.  Walking too much.  Any type of physical activity. DIAGNOSIS  Your caregiver will make sure there are no serious problems causing the pain. When nothing serious is found, the symptoms usually indicate that the pain is from the round ligament. TREATMENT   Sit down and relax when the pain starts.  Flex your knees up to your belly.  Lay on your side with a pillow under your belly (abdomen) and another one between your legs.  Sit in a hot bath for 15 to 20 minutes or until the pain goes away. HOME CARE INSTRUCTIONS   Only take over-the-counter or prescriptions medicines for pain, discomfort or fever as directed by your caregiver.  Sit and stand slowly.  Avoid long walks if it causes  pain.  Stop or lessen your physical activities if it causes pain. SEEK MEDICAL CARE IF:   The pain does not go away with any of your treatment.  You need stronger medication for the pain.  You develop back pain that you did not have before with the side pain. SEEK IMMEDIATE MEDICAL CARE IF:   You develop a temperature of 102 F (38.9 C) or higher.  You develop uterine contractions.  You develop vaginal bleeding.  You develop nausea, vomiting or diarrhea.  You develop chills.  You have pain when you urinate. Document Released: 04/27/2008 Document Revised: 10/11/2011 Document Reviewed: 04/27/2008 [facility] Patient Information 2014 [place], [place].  Pregnancy - Second Trimester The second trimester of pregnancy (3 to 6 months) is a period of rapid growth for you and your baby. At the end of the sixth month, your baby is about 9 inches long and weighs 1 1/2 pounds. You will begin to feel the baby move between 18 and 20 weeks of the pregnancy. This is called quickening. Weight gain is faster. A clear fluid (colostrum) may leak out of your breasts. You may feel small contractions of the womb (uterus). This is known as false labor or Braxton-Hicks contractions. This is like a practice for labor when the baby is ready to be born. Usually, the problems with morning sickness have usually passed by the end of your first trimester. Some women  develop small dark blotches (called cholasma, mask of pregnancy) on their face that usually goes away after the baby is born. Exposure to the sun makes the blotches worse. Acne may also develop in some pregnant women and pregnant women who have acne, may find that it goes away. PRENATAL EXAMS  Blood work may continue to be done during prenatal exams. These tests are done to check on your health and the probable health of your baby. Blood work is used to follow your blood levels (hemoglobin). Anemia (low hemoglobin) is common during pregnancy. Iron and vitamins  are given to help prevent this. You will also be checked for diabetes between 24 and 28 weeks of the pregnancy. Some of the previous blood tests may be repeated.  The size of the uterus is measured during each visit. This is to make sure that the baby is continuing to grow properly according to the dates of the pregnancy.  Your blood pressure is checked every prenatal visit. This is to make sure you are not getting toxemia.  Your urine is checked to make sure you do not have an infection, diabetes or protein in the urine.  Your weight is checked often to make sure gains are happening at the suggested rate. This is to ensure that both you and your baby are growing normally.  Sometimes, an ultrasound is performed to confirm the proper growth and development of the baby. This is a test which bounces harmless sound waves off the baby so your caregiver can more accurately determine due dates. Sometimes, a test is done on the amniotic fluid surrounding the baby. This test is called an amniocentesis. The amniotic fluid is obtained by sticking a needle into the belly (abdomen). This is done to check the chromosomes in instances where there is a concern about possible genetic problems with the baby. It is also sometimes done near the end of pregnancy if an early delivery is required. In this case, it is done to help make sure the baby's lungs are mature enough for the baby to live outside of the womb. CHANGES OCCURING IN THE SECOND TRIMESTER OF PREGNANCY Your body goes through many changes during pregnancy. They vary from person to person. Talk to your caregiver about changes you notice that you are concerned about.  During the second trimester, you will likely have an increase in your appetite. It is normal to have cravings for certain foods. This varies from person to person and pregnancy to pregnancy.  Your lower abdomen will begin to bulge.  You may have to urinate more often because the uterus and  baby are pressing on your bladder. It is also common to get more bladder infections during pregnancy. You can help this by drinking lots of fluids and emptying your bladder before and after intercourse.  You may begin to get stretch marks on your hips, abdomen, and breasts. These are normal changes in the body during pregnancy. There are no exercises or medicines to take that prevent this change.  You may begin to develop swollen and bulging veins (varicose veins) in your legs. Wearing support hose, elevating your feet for 15 minutes, 3 to 4 times a day and limiting salt in your diet helps lessen the problem.  Heartburn may develop as the uterus grows and pushes up against the stomach. Antacids recommended by your caregiver helps with this problem. Also, eating smaller meals 4 to 5 times a day helps.  Constipation can be treated with a stool softener or  adding bulk to your diet. Drinking lots of fluids, and eating vegetables, fruits, and whole grains are helpful.  Exercising is also helpful. If you have been very active up until your pregnancy, most of these activities can be continued during your pregnancy. If you have been less active, it is helpful to start an exercise program such as walking.  Hemorrhoids may develop at the end of the second trimester. Warm sitz baths and hemorrhoid cream recommended by your caregiver helps hemorrhoid problems.  Backaches may develop during this time of your pregnancy. Avoid heavy lifting, wear low heal shoes, and practice good posture to help with backache problems.  Some pregnant women develop tingling and numbness of their hand and fingers because of swelling and tightening of ligaments in the wrist (carpel tunnel syndrome). This goes away after the baby is born.  As your breasts enlarge, you may have to get a bigger bra. Get a comfortable, cotton, support bra. Do not get a nursing bra until the last month of the pregnancy if you will be nursing the  baby.  You may get a dark line from your belly button to the pubic area called the linea nigra.  You may develop rosy cheeks because of increase blood flow to the face.  You may develop spider looking lines of the face, neck, arms, and chest. These go away after the baby is born. HOME CARE INSTRUCTIONS   It is extremely important to avoid all smoking, herbs, alcohol, and unprescribed drugs during your pregnancy. These chemicals affect the formation and growth of the baby. Avoid these chemicals throughout the pregnancy to ensure the delivery of a healthy infant.  Most of your home care instructions are the same as suggested for the first trimester of your pregnancy. Keep your caregiver's appointments. Follow your caregiver's instructions regarding medicine use, exercise, and diet.  During pregnancy, you are providing food for you and your baby. Continue to eat regular, well-balanced meals. Choose foods such as meat, fish, milk and other low fat dairy products, vegetables, fruits, and whole-grain breads and cereals. Your caregiver will tell you of the ideal weight gain.  A physical sexual relationship may be continued up until near the end of pregnancy if there are no other problems. Problems could include early (premature) leaking of amniotic fluid from the membranes, vaginal bleeding, abdominal pain, or other medical or pregnancy problems.  Exercise regularly if there are no restrictions. Check with your caregiver if you are unsure of the safety of some of your exercises. The greatest weight gain will occur in the last 2 trimesters of pregnancy. Exercise will help you:  Control your weight.  Get you in shape for labor and delivery.  Lose weight after you have the baby.  Wear a good support or jogging bra for breast tenderness during pregnancy. This may help if worn during sleep. Pads or tissues may be used in the bra if you are leaking colostrum.  Do not use hot tubs, steam rooms or saunas  throughout the pregnancy.  Wear your seat belt at all times when driving. This protects you and your baby if you are in an accident.  Avoid raw meat, uncooked cheese, cat litter boxes, and soil used by cats. These carry germs that can cause birth defects in the baby.  The second trimester is also a good time to visit your dentist for your dental health if this has not been done yet. Getting your teeth cleaned is okay. Use a soft toothbrush. Brush  gently during pregnancy.  It is easier to leak urine during pregnancy. Tightening up and strengthening the pelvic muscles will help with this problem. Practice stopping your urination while you are going to the bathroom. These are the same muscles you need to strengthen. It is also the muscles you would use as if you were trying to stop from passing gas. You can practice tightening these muscles up 10 times a set and repeating this about 3 times per day. Once you know what muscles to tighten up, do not perform these exercises during urination. It is more likely to contribute to an infection by backing up the urine.  Ask for help if you have financial, counseling, or nutritional needs during pregnancy. Your caregiver will be able to offer counseling for these needs as well as refer you for other special needs.  Your skin may become oily. If so, wash your face with mild soap, use non-greasy moisturizer and oil or cream based makeup. MEDICINES AND DRUG USE IN PREGNANCY  Take prenatal vitamins as directed. The vitamin should contain 1 milligram of folic acid. Keep all vitamins out of reach of children. Only a couple vitamins or tablets containing iron may be fatal to a baby or young child when ingested.  Avoid use of all medicines, including herbs, over-the-counter medicines, not prescribed or suggested by your caregiver. Only take over-the-counter or prescription medicines for pain, discomfort, or fever as directed by your caregiver. Do not use aspirin.  Let  your caregiver also know about herbs you may be using.  Alcohol is related to a number of birth defects. This includes fetal alcohol syndrome. All alcohol, in any form, should be avoided completely. Smoking will cause low birth rate and premature babies.  Street or illegal drugs are very harmful to the baby. They are absolutely forbidden. A baby born to an addicted mother will be addicted at birth. The baby will go through the same withdrawal an adult does. SEEK MEDICAL CARE IF:  You have any concerns or worries during your pregnancy. It is better to call with your questions if you feel they cannot wait, rather than worry about them. SEEK IMMEDIATE MEDICAL CARE IF:   An unexplained oral temperature above 102 F (38.9 C) develops, or as your caregiver suggests.  You have leaking of fluid from the vagina (birth canal). If leaking membranes are suspected, take your temperature and tell your caregiver of this when you call.  There is vaginal spotting, bleeding, or passing clots. Tell your caregiver of the amount and how many pads are used. Light spotting in pregnancy is common, especially following intercourse.  You develop a bad smelling vaginal discharge with a change in the color from clear to white.  You continue to feel sick to your stomach (nauseated) and have no relief from remedies suggested. You vomit blood or coffee ground-like materials.  You lose more than 2 pounds of weight or gain more than 2 pounds of weight over 1 week, or as suggested by your caregiver.  You notice swelling of your face, hands, feet, or legs.  You get exposed to [place] measles and have never had them.  You are exposed to fifth disease or chickenpox.  You develop belly (abdominal) pain. Round ligament discomfort is a common non-cancerous (benign) cause of abdominal pain in pregnancy. Your caregiver still must evaluate you.  You develop a bad headache that does not go away.  You develop fever, diarrhea,  pain with urination, or shortness of breath.  You develop visual problems, blurry, or double vision.  You fall or are in a car accident or any kind of trauma.  There is mental or physical violence at home. Document Released: 07/13/2001 Document Revised: 04/12/2012 Document Reviewed: 01/15/2009 [facility] Patient Information 2014 [place], [place].  Breastfeeding A change in hormones during your pregnancy causes growth of your breast tissue and an increase in number and size of milk ducts. The hormone prolactin allows proteins, sugars, and fats from your blood supply to make breast milk in your milk-producing glands. The hormone progesterone prevents breast milk from being released before the birth of your baby. After the birth of your baby, your progesterone level decreases allowing breast milk to be released. Thoughts of your baby, as well as his or her sucking or crying, can stimulate the release of milk from the milk-producing glands. Deciding to breastfeed (nurse) is one of the best choices you can make for you and your baby. The information that follows gives a brief review of the benefits, as well as other important skills to know about breastfeeding. BENEFITS OF BREASTFEEDING For your baby  The first milk (colostrum) helps your baby's digestive system function better.   There are antibodies in your milk that help your baby fight off infections.   Your baby has a lower incidence of asthma, allergies, and sudden infant death syndrome (SIDS).   The nutrients in breast milk are better for your baby than infant formulas.  Breast milk improves your baby's brain development.   Your baby will have less gas, colic, and constipation.  Your baby is less likely to develop other conditions, such as childhood obesity, asthma, or diabetes mellitus. For you  Breastfeeding helps develop a very special bond between you and your baby.   Breastfeeding is convenient, always available at the  correct temperature, and costs nothing.   Breastfeeding helps to burn calories and helps you lose the weight gained during pregnancy.   Breastfeeding makes your uterus contract back down to normal size faster and slows bleeding following delivery.   Breastfeeding mothers have a lower risk of developing osteoporosis or breast or ovarian cancer later in life.  BREASTFEEDING FREQUENCY  A healthy, full-term baby may breastfeed as often as every hour or space his or her feedings to every 3 hours. Breastfeeding frequency will vary from baby to baby.   Newborns should be fed no less than every 2 3 hours during the day and every 4 5 hours during the night. You should breastfeed a minimum of 8 feedings in a 24 hour period.  Awaken your baby to breastfeed if it has been 3 4 hours since the last feeding.  Breastfeed when you feel the need to reduce the fullness of your breasts or when your newborn shows signs of hunger. Signs that your baby may be hungry include:  Increased alertness or activity.  Stretching.  Movement of the head from side to side.  Movement of the head and opening of the mouth when the corner of the mouth or cheek is stroked (rooting).  Increased sucking sounds, smacking lips, cooing, sighing, or squeaking.  Hand-to-mouth movements.  Increased sucking of fingers or hands.  Fussing.  Intermittent crying.  Signs of extreme hunger will require calming and consoling before you try to feed your baby. Signs of extreme hunger may include:  Restlessness.  A loud, strong cry.  Screaming.  Frequent feeding will help you make more milk and will help prevent problems, such as sore nipples and engorgement  of the breasts.  BREASTFEEDING   Whether lying down or sitting, be sure that the baby's abdomen is facing your abdomen.   Support your breast with 4 fingers under your breast and your thumb above your nipple. Make sure your fingers are well away from your nipple and  your baby's mouth.   Stroke your baby's lips gently with your finger or nipple.   When your baby's mouth is open wide enough, place all of your nipple and as much of the colored area around your nipple (areola) as possible into your baby's mouth.  More areola should be visible above his or her upper lip than below his or her lower lip.  Your baby's tongue should be between his or her lower gum and your breast.  Ensure that your baby's mouth is correctly positioned around the nipple (latched). Your baby's lips should create a seal on your breast.  Signs that your baby has effectively latched onto your nipple include:  Tugging or sucking without pain.  Swallowing heard between sucks.  Absent click or smacking sound.  Muscle movement above and in front of his or her ears with sucking.  Your baby must suck about 2 3 minutes in order to get your milk. Allow your baby to feed on each breast as long as he or she wants. Nurse your baby until he or she unlatches or falls asleep at the first breast, then offer the second breast.  Signs that your baby is full and satisfied include:  A gradual decrease in the number of sucks or complete cessation of sucking.  Falling asleep.  Extension or relaxation of his or her body.  Retention of a small amount of milk in his or her mouth.  Letting go of your breast by himself or herself.  Signs of effective breastfeeding in you include:  Breasts that have increased firmness, weight, and size prior to feeding.  Breasts that are softer after nursing.  Increased milk volume, as well as a change in milk consistency and color by the 5th day of breastfeeding.  Breast fullness relieved by breastfeeding.  Nipples are not sore, cracked, or bleeding.  If needed, break the suction by putting your finger into the corner of your baby's mouth and sliding your finger between his or her gums. Then, remove your breast from his or her mouth.  It is common  for babies to spit up a small amount after a feeding.  Babies often swallow air during feeding. This can make babies fussy. Burping your baby between breasts can help with this.  Vitamin D supplements are recommended for babies who get only breast milk.  Avoid using a pacifier during your baby's first 4 6 weeks.  Avoid supplemental feedings of water, formula, or juice in place of breastfeeding. Breast milk is all the food your baby needs. It is not necessary for your baby to have water or formula. Your breasts will make more milk if supplemental feedings are avoided during the early weeks. HOW TO TELL WHETHER YOUR BABY IS GETTING ENOUGH BREAST MILK Wondering whether or not your baby is getting enough milk is a common concern among mothers. You can be assured that your baby is getting enough milk if:   Your baby is actively sucking and you hear swallowing.   Your baby seems relaxed and satisfied after a feeding.   Your baby nurses at least 8 12 times in a 24 hour time period.  During the first 52 21 days of age:  Your baby is wetting at least 3 5 diapers in a 24 hour period. The urine should be clear and pale yellow.  Your baby is having at least 3 4 stools in a 24 hour period. The stool should be soft and yellow.  At 73 83 days of age, your baby is having at least 3 6 stools in a 24 hour period. The stool should be seedy and yellow by 5 days of age.  Your baby has a weight loss less than 7 10% during the first 29 days of age.  Your baby does not lose weight after 60 59 days of age.  Your baby gains 4 7 ounces each week after he or she is 36 days of age.  Your baby gains weight by 32 days of age and is back to birth weight within 2 weeks. ENGORGEMENT In the first week after your baby is born, you may experience extremely full breasts (engorgement). When engorged, your breasts may feel heavy, warm, or tender to the touch. Engorgement peaks within 24 48 hours after delivery of your  baby.  Engorgement may be reduced by:  Continuing to breastfeed.  Increasing the frequency of breastfeeding.  Taking warm showers or applying warm, moist heat to your breasts just before each feeding. This increases circulation and helps the milk flow.   Gently massaging your breast before and during the feedings. With your fingertips, massage from your chest wall towards your nipple in a circular motion.   Ensuring that your baby empties at least one breast at every feeding. It also helps to start the next feeding on the opposite breast.   Expressing breast milk by hand or by using a breast pump to empty the breasts if your baby is sleepy, or not nursing well. You may also want to express milk if you are returning to work oryou feel you are getting engorged.  Ensuring your baby is latched on and positioned properly while breastfeeding. If you follow these suggestions, your engorgement should improve in 24 48 hours. If you are still experiencing difficulty, call your lactation consultant or caregiver.  CARING FOR YOURSELF Take care of your breasts.  Bathe or shower daily.   Avoid using soap on your nipples.   Wear a supportive bra. Avoid wearing underwire style bras.  Air dry your nipples for a 3 after each feeding.   Use only cotton bra pads to absorb breast milk leakage. Leaking of breast milk between feedings is normal.   Use only pure lanolin on your nipples after nursing. You do not need to wash it off before feeding your baby again. Another option is to express a few drops of breast milk and gently massage that milk into your nipples.  Continue breast self-awareness checks. Take care of yourself.  Eat healthy foods. Alternate 3 meals with 3 snacks.  Avoid foods that you notice affect your baby in a bad way.  Drink milk, fruit juice, and water to satisfy your thirst (about 8 glasses a day).   Rest often, relax, and take your prenatal vitamins to  prevent fatigue, stress, and anemia.  Avoid chewing and smoking tobacco.  Avoid alcohol and drug use.  Take over-the-counter and prescribed medicine only as directed by your caregiver or pharmacist. You should always check with your caregiver or pharmacist before taking any new medicine, vitamin, or herbal supplement.  Know that pregnancy is possible while breastfeeding. If desired, talk to your caregiver about family planning and safe birth control methods  that may be used while breastfeeding. SEEK MEDICAL CARE IF:   You feel like you want to stop breastfeeding or have become frustrated with breastfeeding.  You have painful breasts or nipples.  Your nipples are cracked or bleeding.  Your breasts are red, tender, or warm.  You have a swollen area on either breast.  You have a fever or chills.  You have nausea or vomiting.  You have drainage from your nipples.  Your breasts do not become full before feedings by the 5th day after delivery.  You feel sad and depressed.  Your baby is too sleepy to eat well.  Your baby is having trouble sleeping.   Your baby is wetting less than 3 diapers in a 24 hour period.  Your baby has less than 3 stools in a 24 hour period.  Your baby's skin or the white part of his or her eyes becomes more yellow.   Your baby is not gaining weight by 2 days of age. MAKE SURE YOU:   Understand these instructions.  Will watch your condition.  Will get help right away if you are not doing well or get worse. Document Released: 07/19/2005 Document Revised: 04/12/2012 Document Reviewed: 02/23/2012 [facility] Patient Information 2014 [place], [place].

## 2013-03-06 NOTE — Progress Notes (Signed)
P-[id number] 

## 2013-03-06 NOTE — Progress Notes (Signed)
Doing well--having some pelvic pain, but normal u/s and cervical length but needs to complete outflow tracts.

## 2013-03-12 ENCOUNTER — Ambulatory Visit (HOSPITAL_COMMUNITY)
Admission: RE | Admit: 2013-03-12 | Discharge: 2013-03-12 | Disposition: A | Discharge status: Home / Self Care | Payer: Medicaid Other | Source: Ambulatory Visit | Attending: Family Medicine | Admitting: Family Medicine

## 2013-03-12 DIAGNOSIS — Z3689 Encounter for other specified antenatal screening: Secondary | ICD-10-CM | POA: Insufficient documentation

## 2013-03-12 DIAGNOSIS — D649 Anemia, unspecified: Secondary | ICD-10-CM | POA: Insufficient documentation

## 2013-03-12 DIAGNOSIS — O99019 Anemia complicating pregnancy, unspecified trimester: Secondary | ICD-10-CM | POA: Insufficient documentation

## 2013-03-12 DIAGNOSIS — B181 Chronic viral hepatitis B without delta-agent: Secondary | ICD-10-CM

## 2013-03-12 DIAGNOSIS — Z3482 Encounter for supervision of other normal pregnancy, second trimester: Secondary | ICD-10-CM

## 2013-04-03 ENCOUNTER — Encounter: Payer: Medicaid Other | Admitting: Family Medicine

## 2013-04-03 ENCOUNTER — Ambulatory Visit (INDEPENDENT_AMBULATORY_CARE_PROVIDER_SITE_OTHER): Payer: Medicaid Other | Admitting: Family Medicine

## 2013-04-03 ENCOUNTER — Encounter: Payer: Self-pay | Admitting: Family Medicine

## 2013-04-03 VITALS — BP 123/68 | Wt 187.0 lb

## 2013-04-03 DIAGNOSIS — Z3482 Encounter for supervision of other normal pregnancy, second trimester: Secondary | ICD-10-CM

## 2013-04-03 DIAGNOSIS — Z3480 Encounter for supervision of other normal pregnancy, unspecified trimester: Secondary | ICD-10-CM

## 2013-04-03 NOTE — Patient Instructions (Signed)
Pregnancy - Second Trimester The second trimester of pregnancy (3 to 6 months) is a period of rapid growth for you and your baby. At the end of the sixth month, your baby is about 9 inches long and weighs 1 1/2 pounds. You will begin to feel the baby move between 18 and 20 weeks of the pregnancy. This is called quickening. Weight gain is faster. A clear fluid (colostrum) may leak out of your breasts. You may feel small contractions of the womb (uterus). This is known as false labor or Braxton-Hicks contractions. This is like a practice for labor when the baby is ready to be born. Usually, the problems with morning sickness have usually passed by the end of your first trimester. Some women develop small dark blotches (called cholasma, mask of pregnancy) on their face that usually goes away after the baby is born. Exposure to the sun makes the blotches worse. Acne may also develop in some pregnant women and pregnant women who have acne, may find that it goes away. PRENATAL EXAMS  Blood work may continue to be done during prenatal exams. These tests are done to check on your health and the probable health of your baby. Blood work is used to follow your blood levels (hemoglobin). Anemia (low hemoglobin) is common during pregnancy. Iron and vitamins are given to help prevent this. You will also be checked for diabetes between 24 and 28 weeks of the pregnancy. Some of the previous blood tests may be repeated.  The size of the uterus is measured during each visit. This is to make sure that the baby is continuing to grow properly according to the dates of the pregnancy.  Your blood pressure is checked every prenatal visit. This is to make sure you are not getting toxemia.  Your urine is checked to make sure you do not have an infection, diabetes or protein in the urine.  Your weight is checked often to make sure gains are happening at the suggested rate. This is to ensure that both you and your baby are  growing normally.  Sometimes, an ultrasound is performed to confirm the proper growth and development of the baby. This is a test which bounces harmless sound waves off the baby so your caregiver can more accurately determine due dates. Sometimes, a test is done on the amniotic fluid surrounding the baby. This test is called an amniocentesis. The amniotic fluid is obtained by sticking a needle into the belly (abdomen). This is done to check the chromosomes in instances where there is a concern about possible genetic problems with the baby. It is also sometimes done near the end of pregnancy if an early delivery is required. In this case, it is done to help make sure the baby's lungs are mature enough for the baby to live outside of the womb. CHANGES OCCURING IN THE SECOND TRIMESTER OF PREGNANCY Your body goes through many changes during pregnancy. They vary from person to person. Talk to your caregiver about changes you notice that you are concerned about.  During the second trimester, you will likely have an increase in your appetite. It is normal to have cravings for certain foods. This varies from person to person and pregnancy to pregnancy.  Your lower abdomen will begin to bulge.  You may have to urinate more often because the uterus and baby are pressing on your bladder. It is also common to get more bladder infections during pregnancy. You can help this by drinking lots of fluids   and emptying your bladder before and after intercourse.  You may begin to get stretch marks on your hips, abdomen, and breasts. These are normal changes in the body during pregnancy. There are no exercises or medicines to take that prevent this change.  You may begin to develop swollen and bulging veins (varicose veins) in your legs. Wearing support hose, elevating your feet for 15 minutes, 3 to 4 times a day and limiting salt in your diet helps lessen the problem.  Heartburn may develop as the uterus grows and  pushes up against the stomach. Antacids recommended by your caregiver helps with this problem. Also, eating smaller meals 4 to 5 times a day helps.  Constipation can be treated with a stool softener or adding bulk to your diet. Drinking lots of fluids, and eating vegetables, fruits, and whole grains are helpful.  Exercising is also helpful. If you have been very active up until your pregnancy, most of these activities can be continued during your pregnancy. If you have been less active, it is helpful to start an exercise program such as walking.  Hemorrhoids may develop at the end of the second trimester. Warm sitz baths and hemorrhoid cream recommended by your caregiver helps hemorrhoid problems.  Backaches may develop during this time of your pregnancy. Avoid heavy lifting, wear low heal shoes, and practice good posture to help with backache problems.  Some pregnant women develop tingling and numbness of their hand and fingers because of swelling and tightening of ligaments in the wrist (carpel tunnel syndrome). This goes away after the baby is born.  As your breasts enlarge, you may have to get a bigger bra. Get a comfortable, cotton, support bra. Do not get a nursing bra until the last month of the pregnancy if you will be nursing the baby.  You may get a dark line from your belly button to the pubic area called the linea nigra.  You may develop rosy cheeks because of increase blood flow to the face.  You may develop spider looking lines of the face, neck, arms, and chest. These go away after the baby is born. HOME CARE INSTRUCTIONS   It is extremely important to avoid all smoking, herbs, alcohol, and unprescribed drugs during your pregnancy. These chemicals affect the formation and growth of the baby. Avoid these chemicals throughout the pregnancy to ensure the delivery of a healthy infant.  Most of your home care instructions are the same as suggested for the first trimester of your  pregnancy. Keep your caregiver's appointments. Follow your caregiver's instructions regarding medicine use, exercise, and diet.  During pregnancy, you are providing food for you and your baby. Continue to eat regular, well-balanced meals. Choose foods such as meat, fish, milk and other low fat dairy products, vegetables, fruits, and whole-grain breads and cereals. Your caregiver will tell you of the ideal weight gain.  A physical sexual relationship may be continued up until near the end of pregnancy if there are no other problems. Problems could include early (premature) leaking of amniotic fluid from the membranes, vaginal bleeding, abdominal pain, or other medical or pregnancy problems.  Exercise regularly if there are no restrictions. Check with your caregiver if you are unsure of the safety of some of your exercises. The greatest weight gain will occur in the last 2 trimesters of pregnancy. Exercise will help you:  Control your weight.  Get you in shape for labor and delivery.  Lose weight after you have the baby.  Wear   a good support or jogging bra for breast tenderness during pregnancy. This may help if worn during sleep. Pads or tissues may be used in the bra if you are leaking colostrum.  Do not use hot tubs, steam rooms or saunas throughout the pregnancy.  Wear your seat belt at all times when driving. This protects you and your baby if you are in an accident.  Avoid raw meat, uncooked cheese, cat litter boxes, and soil used by cats. These carry germs that can cause birth defects in the baby.  The second trimester is also a good time to visit your dentist for your dental health if this has not been done yet. Getting your teeth cleaned is okay. Use a soft toothbrush. Brush gently during pregnancy.  It is easier to leak urine during pregnancy. Tightening up and strengthening the pelvic muscles will help with this problem. Practice stopping your urination while you are going to the  bathroom. These are the same muscles you need to strengthen. It is also the muscles you would use as if you were trying to stop from passing gas. You can practice tightening these muscles up 10 times a set and repeating this about 3 times per day. Once you know what muscles to tighten up, do not perform these exercises during urination. It is more likely to contribute to an infection by backing up the urine.  Ask for help if you have financial, counseling, or nutritional needs during pregnancy. Your caregiver will be able to offer counseling for these needs as well as refer you for other special needs.  Your skin may become oily. If so, wash your face with mild soap, use non-greasy moisturizer and oil or cream based makeup. MEDICINES AND DRUG USE IN PREGNANCY  Take prenatal vitamins as directed. The vitamin should contain 1 milligram of folic acid. Keep all vitamins out of reach of children. Only a couple vitamins or tablets containing iron may be fatal to a baby or young child when ingested.  Avoid use of all medicines, including herbs, over-the-counter medicines, not prescribed or suggested by your caregiver. Only take over-the-counter or prescription medicines for pain, discomfort, or fever as directed by your caregiver. Do not use aspirin.  Let your caregiver also know about herbs you may be using.  Alcohol is related to a number of birth defects. This includes fetal alcohol syndrome. All alcohol, in any form, should be avoided completely. Smoking will cause low birth rate and premature babies.  Street or illegal drugs are very harmful to the baby. They are absolutely forbidden. A baby born to an addicted mother will be addicted at birth. The baby will go through the same withdrawal an adult does. SEEK MEDICAL CARE IF:  You have any concerns or worries during your pregnancy. It is better to call with your questions if you feel they cannot wait, rather than worry about them. SEEK IMMEDIATE  MEDICAL CARE IF:   An unexplained oral temperature above 102 F (38.9 C) develops, or as your caregiver suggests.  You have leaking of fluid from the vagina (birth canal). If leaking membranes are suspected, take your temperature and tell your caregiver of this when you call.  There is vaginal spotting, bleeding, or passing clots. Tell your caregiver of the amount and how many pads are used. Light spotting in pregnancy is common, especially following intercourse.  You develop a bad smelling vaginal discharge with a change in the color from clear to white.  You continue to feel   sick to your stomach (nauseated) and have no relief from remedies suggested. You vomit blood or coffee ground-like materials.  You lose more than 2 pounds of weight or gain more than 2 pounds of weight over 1 week, or as suggested by your caregiver.  You notice swelling of your face, hands, feet, or legs.  You get exposed to German measles and have never had them.  You are exposed to fifth disease or chickenpox.  You develop belly (abdominal) pain. Round ligament discomfort is a common non-cancerous (benign) cause of abdominal pain in pregnancy. Your caregiver still must evaluate you.  You develop a bad headache that does not go away.  You develop fever, diarrhea, pain with urination, or shortness of breath.  You develop visual problems, blurry, or double vision.  You fall or are in a car accident or any kind of trauma.  There is mental or physical violence at home. Document Released: 07/13/2001 Document Revised: 04/12/2012 Document Reviewed: 01/15/2009 [employer] Patient Information 2014 [employer].  Breastfeeding A change in hormones during your pregnancy causes growth of your breast tissue and an increase in number and size of milk ducts. The hormone prolactin allows proteins, sugars, and fats from your blood supply to make breast milk in your milk-producing glands. The hormone progesterone prevents  breast milk from being released before the birth of your baby. After the birth of your baby, your progesterone level decreases allowing breast milk to be released. Thoughts of your baby, as well as his or her sucking or crying, can stimulate the release of milk from the milk-producing glands. Deciding to breastfeed (nurse) is one of the best choices you can make for you and your baby. The information that follows gives a brief review of the benefits, as well as other important skills to know about breastfeeding. BENEFITS OF BREASTFEEDING For your baby  The first milk (colostrum) helps your baby's digestive system function better.   There are antibodies in your milk that help your baby fight off infections.   Your baby has a lower incidence of asthma, allergies, and sudden infant death syndrome (SIDS).   The nutrients in breast milk are better for your baby than infant formulas.  Breast milk improves your baby's brain development.   Your baby will have less gas, colic, and constipation.  Your baby is less likely to develop other conditions, such as childhood obesity, asthma, or diabetes mellitus. For you  Breastfeeding helps develop a very special bond between you and your baby.   Breastfeeding is convenient, always available at the correct temperature, and costs nothing.   Breastfeeding helps to burn calories and helps you lose the weight gained during pregnancy.   Breastfeeding makes your uterus contract back down to normal size faster and slows bleeding following delivery.   Breastfeeding mothers have a lower risk of developing osteoporosis or breast or ovarian cancer later in life.  BREASTFEEDING FREQUENCY  A healthy, full-term baby may breastfeed as often as every hour or space his or her feedings to every 3 hours. Breastfeeding frequency will vary from baby to baby.   Newborns should be fed no less than every 2 3 hours during the day and every 4 5 hours during the  night. You should breastfeed a minimum of 8 feedings in a 24 hour period.  Awaken your baby to breastfeed if it has been 3 4 hours since the last feeding.  Breastfeed when you feel the need to reduce the fullness of your breasts or when   your newborn shows signs of hunger. Signs that your baby may be hungry include:  Increased alertness or activity.  Stretching.  Movement of the head from side to side.  Movement of the head and opening of the mouth when the corner of the mouth or cheek is stroked (rooting).  Increased sucking sounds, smacking lips, cooing, sighing, or squeaking.  Hand-to-mouth movements.  Increased sucking of fingers or hands.  Fussing.  Intermittent crying.  Signs of extreme hunger will require calming and consoling before you try to feed your baby. Signs of extreme hunger may include:  Restlessness.  A loud, strong cry.  Screaming.  Frequent feeding will help you make more milk and will help prevent problems, such as sore nipples and engorgement of the breasts.  BREASTFEEDING   Whether lying down or sitting, be sure that the baby's abdomen is facing your abdomen.   Support your breast with 4 fingers under your breast and your thumb above your nipple. Make sure your fingers are well away from your nipple and your baby's mouth.   Stroke your baby's lips gently with your finger or nipple.   When your baby's mouth is open wide enough, place all of your nipple and as much of the colored area around your nipple (areola) as possible into your baby's mouth.  More areola should be visible above his or her upper lip than below his or her lower lip.  Your baby's tongue should be between his or her lower gum and your breast.  Ensure that your baby's mouth is correctly positioned around the nipple (latched). Your baby's lips should create a seal on your breast.  Signs that your baby has effectively latched onto your nipple include:  Tugging or sucking  without pain.  Swallowing heard between sucks.  Absent click or smacking sound.  Muscle movement above and in front of his or her ears with sucking.  Your baby must suck about 2 3 minutes in order to get your milk. Allow your baby to feed on each breast as long as he or she wants. Nurse your baby until he or she unlatches or falls asleep at the first breast, then offer the second breast.  Signs that your baby is full and satisfied include:  A gradual decrease in the number of sucks or complete cessation of sucking.  Falling asleep.  Extension or relaxation of his or her body.  Retention of a small amount of milk in his or her mouth.  Letting go of your breast by himself or herself.  Signs of effective breastfeeding in you include:  Breasts that have increased firmness, weight, and size prior to feeding.  Breasts that are softer after nursing.  Increased milk volume, as well as a change in milk consistency and color by the 5th day of breastfeeding.  Breast fullness relieved by breastfeeding.  Nipples are not sore, cracked, or bleeding.  If needed, break the suction by putting your finger into the corner of your baby's mouth and sliding your finger between his or her gums. Then, remove your breast from his or her mouth.  It is common for babies to spit up a small amount after a feeding.  Babies often swallow air during feeding. This can make babies fussy. Burping your baby between breasts can help with this.  Vitamin D supplements are recommended for babies who get only breast milk.  Avoid using a pacifier during your baby's first 4 6 weeks.  Avoid supplemental feedings of water, formula, or   juice in place of breastfeeding. Breast milk is all the food your baby needs. It is not necessary for your baby to have water or formula. Your breasts will make more milk if supplemental feedings are avoided during the early weeks. HOW TO TELL WHETHER YOUR BABY IS GETTING ENOUGH BREAST  MILK Wondering whether or not your baby is getting enough milk is a common concern among mothers. You can be assured that your baby is getting enough milk if:   Your baby is actively sucking and you hear swallowing.   Your baby seems relaxed and satisfied after a feeding.   Your baby nurses at least 8 12 times in a 24 hour time period.  During the first 3 5 days of age:  Your baby is wetting at least 3 5 diapers in a 24 hour period. The urine should be clear and pale yellow.  Your baby is having at least 3 4 stools in a 24 hour period. The stool should be soft and yellow.  At 5 7 days of age, your baby is having at least 3 6 stools in a 24 hour period. The stool should be seedy and yellow by 5 days of age.  Your baby has a weight loss less than 7 10% during the first 3 days of age.  Your baby does not lose weight after 3 7 days of age.  Your baby gains 4 7 ounces each week after he or she is 4 days of age.  Your baby gains weight by 5 days of age and is back to birth weight within 2 weeks. ENGORGEMENT In the first week after your baby is born, you may experience extremely full breasts (engorgement). When engorged, your breasts may feel heavy, warm, or tender to the touch. Engorgement peaks within 24 48 hours after delivery of your baby.  Engorgement may be reduced by:  Continuing to breastfeed.  Increasing the frequency of breastfeeding.  Taking warm showers or applying warm, moist heat to your breasts just before each feeding. This increases circulation and helps the milk flow.   Gently massaging your breast before and during the feedings. With your fingertips, massage from your chest wall towards your nipple in a circular motion.   Ensuring that your baby empties at least one breast at every feeding. It also helps to start the next feeding on the opposite breast.   Expressing breast milk by hand or by using a breast pump to empty the breasts if your baby is sleepy, or  not nursing well. You may also want to express milk if you are returning to work oryou feel you are getting engorged.  Ensuring your baby is latched on and positioned properly while breastfeeding. If you follow these suggestions, your engorgement should improve in 24 48 hours. If you are still experiencing difficulty, call your lactation consultant or caregiver.  CARING FOR YOURSELF Take care of your breasts.  Bathe or shower daily.   Avoid using soap on your nipples.   Wear a supportive bra. Avoid wearing underwire style bras.  Air dry your nipples for a 3 4minutes after each feeding.   Use only cotton bra pads to absorb breast milk leakage. Leaking of breast milk between feedings is normal.   Use only pure lanolin on your nipples after nursing. You do not need to wash it off before feeding your baby again. Another option is to express a few drops of breast milk and gently massage that milk into your nipples.  Continue   breast self-awareness checks. Take care of yourself.  Eat healthy foods. Alternate 3 meals with 3 snacks.  Avoid foods that you notice affect your baby in a bad way.  Drink milk, fruit juice, and water to satisfy your thirst (about 8 glasses a day).   Rest often, relax, and take your prenatal vitamins to prevent fatigue, stress, and anemia.  Avoid chewing and smoking tobacco.  Avoid alcohol and drug use.  Take over-the-counter and prescribed medicine only as directed by your caregiver or pharmacist. You should always check with your caregiver or pharmacist before taking any new medicine, vitamin, or herbal supplement.  Know that pregnancy is possible while breastfeeding. If desired, talk to your caregiver about family planning and safe birth control methods that may be used while breastfeeding. SEEK MEDICAL CARE IF:   You feel like you want to stop breastfeeding or have become frustrated with breastfeeding.  You have painful breasts or nipples.  Your  nipples are cracked or bleeding.  Your breasts are red, tender, or warm.  You have a swollen area on either breast.  You have a fever or chills.  You have nausea or vomiting.  You have drainage from your nipples.  Your breasts do not become full before feedings by the 5th day after delivery.  You feel sad and depressed.  Your baby is too sleepy to eat well.  Your baby is having trouble sleeping.   Your baby is wetting less than 3 diapers in a 24 hour period.  Your baby has less than 3 stools in a 24 hour period.  Your baby's skin or the white part of his or her eyes becomes more yellow.   Your baby is not gaining weight by 5 days of age. MAKE SURE YOU:   Understand these instructions.  Will watch your condition.  Will get help right away if you are not doing well or get worse. Document Released: 07/19/2005 Document Revised: 04/12/2012 Document Reviewed: 02/23/2012 [employer] Patient Information 2014 [employer].  

## 2013-04-03 NOTE — Progress Notes (Signed)
P-93  Patient is having increased back pain and cramps.  She is having a hard time at work due to sitting constantly for 12 hour shifts.  She is having trouble regulating her eating etc... Due to her schedule.

## 2013-04-03 NOTE — Progress Notes (Signed)
To f/u fetal heart anatomy with MFM. 28 wk labs next visit.

## 2013-04-13 ENCOUNTER — Ambulatory Visit (HOSPITAL_COMMUNITY): Payer: Medicaid Other

## 2013-04-13 ENCOUNTER — Other Ambulatory Visit: Payer: Self-pay | Admitting: Family Medicine

## 2013-04-13 DIAGNOSIS — Z3482 Encounter for supervision of other normal pregnancy, second trimester: Secondary | ICD-10-CM

## 2013-04-16 ENCOUNTER — Encounter: Payer: Medicaid Other | Admitting: Obstetrics & Gynecology

## 2013-04-17 ENCOUNTER — Other Ambulatory Visit (HOSPITAL_COMMUNITY): Payer: Medicaid Other

## 2013-04-17 ENCOUNTER — Ambulatory Visit (HOSPITAL_COMMUNITY)
Admission: RE | Admit: 2013-04-17 | Discharge: 2013-04-17 | Disposition: A | Discharge status: Home / Self Care | Payer: Medicaid Other | Source: Ambulatory Visit | Attending: Family Medicine | Admitting: Family Medicine

## 2013-04-17 ENCOUNTER — Ambulatory Visit (HOSPITAL_COMMUNITY): Payer: Medicaid Other

## 2013-04-17 ENCOUNTER — Ambulatory Visit (INDEPENDENT_AMBULATORY_CARE_PROVIDER_SITE_OTHER): Payer: Medicaid Other | Admitting: Obstetrics & Gynecology

## 2013-04-17 ENCOUNTER — Other Ambulatory Visit: Payer: Self-pay | Admitting: Family Medicine

## 2013-04-17 ENCOUNTER — Encounter: Payer: Self-pay | Admitting: Obstetrics & Gynecology

## 2013-04-17 VITALS — BP 116/74 | Wt 185.0 lb

## 2013-04-17 DIAGNOSIS — B181 Chronic viral hepatitis B without delta-agent: Secondary | ICD-10-CM

## 2013-04-17 DIAGNOSIS — Z23 Encounter for immunization: Secondary | ICD-10-CM

## 2013-04-17 DIAGNOSIS — Z3482 Encounter for supervision of other normal pregnancy, second trimester: Secondary | ICD-10-CM

## 2013-04-17 DIAGNOSIS — Z3483 Encounter for supervision of other normal pregnancy, third trimester: Secondary | ICD-10-CM

## 2013-04-17 DIAGNOSIS — Z3689 Encounter for other specified antenatal screening: Secondary | ICD-10-CM | POA: Insufficient documentation

## 2013-04-17 DIAGNOSIS — Z3480 Encounter for supervision of other normal pregnancy, unspecified trimester: Secondary | ICD-10-CM

## 2013-04-17 MED ORDER — TETANUS-DIPHTH-ACELL PERTUSSIS 5-2.5-18.5 LF-MCG/0.5 IM SUSP
0.5000 mL | Freq: Once | INTRAMUSCULAR | Status: AC
  Administered 2013-04-17: 0.5 mL via INTRAMUSCULAR

## 2013-04-17 NOTE — Progress Notes (Signed)
P-94  Patient complaining of feeling her heart rate increase at least once a day she feel like her heart beats very fast for about a minute, then it goes back to normal.

## 2013-04-17 NOTE — Patient Instructions (Signed)
Tetanus, Diphtheria (Td) or Tetanus, Diphtheria, Pertussis (Tdap) Vaccine What You Need to Know WHY GET VACCINATED? Tetanus, diphtheria and pertussis can be very serious diseases. TETANUS (Lockjaw) causes painful muscle spasms and stiffness, usually all over the body.  Tetanus can lead to tightening of muscles in the head and neck so the victim cannot open his mouth or swallow, or sometimes even breathe. Tetanus kills about 1 out of 5 people who are infected. DIPHTHERIA can cause a thick membrane to cover the back of the throat.  Diphtheria can lead to breathing problems, paralysis, heart failure, and even death. PERTUSSIS (Whooping Cough) causes severe coughing spells which can lead to difficulty breathing, vomiting, and disturbed sleep.  Pertussis can lead to weight loss, incontinence, rib fractures and passing out from violent coughing. Up to 2 in 100 adolescents and 5 in 100 adults with pertussis are hospitalized or have complications, including pneumonia and death. These 3 diseases are all caused by bacteria. Diphtheria and pertussis are spread from person to person. Tetanus enters the body through cuts, scratches, or wounds. The [place] saw as many as 200,000 cases a year of diphtheria and pertussis before vaccines were available, and hundreds of cases of tetanus. Since then, tetanus and diphtheria cases have dropped by about 99% and pertussis cases by about 92%. Children 6 years of age and younger get DTaP vaccine to protect them from these three diseases. But older children, adolescents, and adults need protection too. VACCINES FOR ADOLESCENTS AND ADULTS: TD AND TDAP Two vaccines are available to protect people 7 years of age and older from these diseases:  Td vaccine has been used for many years. It protects against tetanus and diphtheria.  Tdap vaccine was licensed in 2005. It is the first vaccine for adolescents and adults that protects against pertussis as well as tetanus and  diphtheria. A Td booster dose is recommended every 10 years. Tdap is given only once. WHICH VACCINE, AND WHEN? Ages 7 through 18 years  A dose of Tdap is recommended at age 11 or 12. This dose could be given as early as age 7 for children who missed one or more childhood doses of DTaP.  Children and adolescents who did not get a complete series of DTaP shots by age 7 should complete the series using a combination of Td and Tdap. Age 19 years and Older  All adults should get a booster dose of Td every 10 years. Adults under 65 who have never gotten Tdap should get a dose of Tdap as their next booster dose. Adults 65 and older may get one booster dose of Tdap.  Adults (including women who may become pregnant and adults 65 and older) who expect to have close contact with a baby younger than 12 months of age should get a dose of Tdap to help protect the baby from pertussis.  Healthcare professionals who have direct patient contact in hospitals or clinics should get one dose of Tdap. Protection After a Wound  A person who gets a severe cut or burn might need a dose of Td or Tdap to prevent tetanus infection. Tdap should be used for anyone who has never had a dose previously. Td should be used if Tdap is not available, or for:  Anybody who has already had a dose of Tdap.  Children 7 through 9 years of age who completed the childhood DTaP series.  Adults 65 and older. Pregnant Women  Pregnant women who have never had a dose of Tdap   should get one, after the 20th week of gestation and preferably during the 3rd trimester. If they do not get Tdap during their pregnancy they should get a dose as soon as possible after delivery. Pregnant women who have previously received Tdap and need tetanus or diphtheria vaccine while pregnant should get Td. Tdap and Td may be given at the same time as other vaccines. SOME PEOPLE SHOULD NOT BE VACCINATED OR SHOULD WAIT  Anyone who has had a life-threatening  allergic reaction after a dose of any tetanus, diphtheria, or pertussis containing vaccine should not get Td or Tdap.  Anyone who has a severe allergy to any component of a vaccine should not get that vaccine. Tell your doctor if the person getting the vaccine has any severe allergies.  Anyone who had a coma, or long or multiple seizures within 7 days after a dose of DTP or DTaP should not get Tdap, unless a cause other than the vaccine was found. These people may get Td.  Talk to your doctor if the person getting either vaccine:  Has epilepsy or another nervous system problem.  Had severe swelling or severe pain after a previous dose of DTP, DTaP, DT, Td, or Tdap vaccine.  Has had Guillain Barr Syndrome (GBS). Anyone who has a moderate or severe illness on the day the shot is scheduled should usually wait until they recover before getting Tdap or Td vaccine. A person with a mild illness or low fever can usually be vaccinated. WHAT ARE THE RISKS FROM TDAP AND TD VACCINES? With a vaccine, as with any medicine, there is always a small risk of a life-threatening allergic reaction or other serious problem. Brief fainting spells and related symptoms (such as jerking movements) can happen after any medical procedure, including vaccination. Sitting or lying down for about 15 minutes after a vaccination can help prevent fainting and injuries caused by falls. Tell your doctor if the patient feels dizzy or lightheaded, or has vision changes or ringing in the ears. Getting tetanus, diphtheria, or pertussis would be much more likely to lead to severe problems than getting either Td or Tdap vaccine. Problems reported after Td and Tdap vaccines are listed below. Mild Problems (noticeable, but did not interfere with activities) Tdap  Pain (about 3 in 4 adolescents and 2 in 3 adults).  Redness or swelling (about 1 in 5).  Mild fever of at least 100.4 F (38 C) (up to about 1 in 25 adolescents and 1 in  100 adults).  Headache (about 4 in 10 adolescents and 3 in 10 adults).  Tiredness (about 1 in 3 adolescents and 1 in 4 adults).  Nausea, vomiting, diarrhea, or stomach ache (up to 1 in 4 adolescents and 1 in 10 adults).  Chills, body aches, sore joints, rash, or swollen glands (uncommon). Td  Pain (up to about 8 in 10).  Redness or swelling at the injection site (up to about 1 in 3).  Mild fever (up to about 1 in 15).  Headache or tiredness (uncommon). Moderate Problems (interfered with activities, but did not require medical attention) Tdap  Pain at the injection site (about 1 in 20 adolescents and 1 in 100 adults).  Redness or swelling at the injection site (up to about 1 in 16 adolescents and 1 in 25 adults).  Fever over 102 F (38.9 C) (about 1 in 100 adolescents and 1 in 250 adults).  Headache (1 in 300).  Nausea, vomiting, diarrhea, or stomach ache (up to 3   in 100 adolescents and 1 in 100 adults). Td  Fever over 102 F (38.9 C) (rare). Tdap or Td  Extensive swelling of the arm where the shot was given (up to about 3 in 100). Severe Problems (Unable to perform usual activities; required medical attention) Tdap or Td  Swelling, severe pain, bleeding, and redness in the arm where the shot was given (rare). A severe allergic reaction could occur after any vaccine. They are estimated to occur less than once in a million doses. WHAT IF THERE IS A SEVERE REACTION? What should I look for? Any unusual condition, such as a severe allergic reaction or a high fever. If a severe allergic reaction occurred, it would be within a few minutes to an hour after the shot. Signs of a serious allergic reaction can include difficulty breathing, weakness, hoarseness or wheezing, a fast heartbeat, hives, dizziness, paleness, or swelling of the throat. What should I do?  Call a doctor, or get the person to a doctor right away.  Tell your doctor what happened, the date and time it  happened, and when the vaccination was given.  Ask your provider to report the reaction by filing a Vaccine Adverse Event Reporting System (VAERS) form. Or, you can file this report through the VAERS website at www.vaers.hhs.gov or by calling 1-800-822-7967. VAERS does not provide medical advice. THE NATIONAL VACCINE INJURY COMPENSATION PROGRAM The National Vaccine Injury Compensation Program (VICP) was created in 1986. Persons who believe they may have been injured by a vaccine can learn about the program and about filing a claim by calling 1-800-338-2382 or visiting the VICP website at www.hrsa.gov/vaccinecompensation. HOW CAN I LEARN MORE?  Your doctor can give you the vaccine package insert or suggest other sources of information.  Call your local or state health department.  Contact the Centers for Disease Control and Prevention (CDC):  Call 1-800-232-4636 (1-800-CDC-INFO).  Visit the CDC website at www.cdc.gov/vaccines. CDC Td and Tdap Interim VIS (08/25/10) Document Released: 05/16/2006 Document Revised: 10/11/2011 Document Reviewed: 08/25/2010 ExitCare Patient Information 2014 [employer].  

## 2013-04-17 NOTE — Progress Notes (Signed)
Counseled about Tdap and Flu vaccines, received both today. 1 hr GTT, third trimester labs next visit.  Ultrasound today to follow up fetal heart anatomy;  will follow up results and manage accordingly. Tachycardia is not sustained, HR here is 94.  Denies chest pain or other symptoms. Will continue to watch for now, no intervention needed unless the symptoms become persistent. No other complaints or concerns.  Fetal movement and labor precautions reviewed.

## 2013-04-19 ENCOUNTER — Encounter: Payer: Self-pay | Admitting: Family Medicine

## 2013-04-19 DIAGNOSIS — O365990 Maternal care for other known or suspected poor fetal growth, unspecified trimester, not applicable or unspecified: Secondary | ICD-10-CM | POA: Insufficient documentation

## 2013-05-01 ENCOUNTER — Ambulatory Visit (INDEPENDENT_AMBULATORY_CARE_PROVIDER_SITE_OTHER): Payer: Medicaid Other | Admitting: Obstetrics & Gynecology

## 2013-05-01 ENCOUNTER — Encounter: Payer: Self-pay | Admitting: Obstetrics & Gynecology

## 2013-05-01 VITALS — BP 113/64 | Wt 185.0 lb

## 2013-05-01 DIAGNOSIS — O26899 Other specified pregnancy related conditions, unspecified trimester: Secondary | ICD-10-CM

## 2013-05-01 DIAGNOSIS — B181 Chronic viral hepatitis B without delta-agent: Secondary | ICD-10-CM

## 2013-05-01 DIAGNOSIS — Z3480 Encounter for supervision of other normal pregnancy, unspecified trimester: Secondary | ICD-10-CM

## 2013-05-01 LAB — CBC
HCT: 34.3 % — ABNORMAL LOW (ref 36.0–46.0)
Hemoglobin: 11.2 g/dL — ABNORMAL LOW (ref 12.0–15.0)
MCH: 25.4 pg — ABNORMAL LOW (ref 26.0–34.0)
RBC: 4.41 MIL/uL (ref 3.87–5.11)

## 2013-05-01 NOTE — Progress Notes (Signed)
Routine visit. Good FM. Anatomy u/s normal except lagging AC. They rec a f/u u/s in a month. I will order this.

## 2013-05-01 NOTE — Progress Notes (Signed)
P-81  Patient is having not as much fetal movement as she was.  She still feels the baby moving just not as often as prior visit.

## 2013-05-02 LAB — GLUCOSE TOLERANCE, 1 HOUR (50G) W/O FASTING: Glucose, 1 Hour GTT: 123 mg/dL (ref 70–140)

## 2013-05-02 LAB — RPR

## 2013-05-15 ENCOUNTER — Ambulatory Visit (INDEPENDENT_AMBULATORY_CARE_PROVIDER_SITE_OTHER): Payer: Medicaid Other | Admitting: Obstetrics & Gynecology

## 2013-05-15 VITALS — BP 113/73 | Wt 186.0 lb

## 2013-05-15 DIAGNOSIS — O365990 Maternal care for other known or suspected poor fetal growth, unspecified trimester, not applicable or unspecified: Secondary | ICD-10-CM

## 2013-05-15 DIAGNOSIS — O365931 Maternal care for other known or suspected poor fetal growth, third trimester, fetus 1: Secondary | ICD-10-CM

## 2013-05-15 DIAGNOSIS — Z3480 Encounter for supervision of other normal pregnancy, unspecified trimester: Secondary | ICD-10-CM

## 2013-05-15 DIAGNOSIS — Z3483 Encounter for supervision of other normal pregnancy, third trimester: Secondary | ICD-10-CM

## 2013-05-15 NOTE — Patient Instructions (Signed)
Return to clinic for any obstetric concerns or go to MAU for evaluation  

## 2013-05-15 NOTE — Progress Notes (Signed)
P=106 

## 2013-05-15 NOTE — Progress Notes (Signed)
Followup ultrasound ordered for lagging [facility] noted on 04/17/13 scan.  No other complaints or concerns.  Fetal movement and labor precautions reviewed.

## 2013-05-17 ENCOUNTER — Ambulatory Visit (HOSPITAL_COMMUNITY)
Admission: RE | Admit: 2013-05-17 | Discharge: 2013-05-17 | Disposition: A | Discharge status: Home / Self Care | Payer: Medicaid Other | Source: Ambulatory Visit | Attending: Obstetrics & Gynecology | Admitting: Obstetrics & Gynecology

## 2013-05-17 ENCOUNTER — Ambulatory Visit (HOSPITAL_COMMUNITY): Admission: RE | Admit: 2013-05-17 | Payer: Medicaid Other | Source: Ambulatory Visit

## 2013-05-17 DIAGNOSIS — Z8751 Personal history of pre-term labor: Secondary | ICD-10-CM | POA: Insufficient documentation

## 2013-05-17 DIAGNOSIS — O365990 Maternal care for other known or suspected poor fetal growth, unspecified trimester, not applicable or unspecified: Secondary | ICD-10-CM | POA: Insufficient documentation

## 2013-05-30 ENCOUNTER — Encounter: Payer: Self-pay | Admitting: Obstetrics & Gynecology

## 2013-05-30 ENCOUNTER — Encounter: Payer: Medicaid Other | Admitting: Obstetrics & Gynecology

## 2013-05-30 ENCOUNTER — Ambulatory Visit (INDEPENDENT_AMBULATORY_CARE_PROVIDER_SITE_OTHER): Payer: Medicaid Other | Admitting: Obstetrics & Gynecology

## 2013-05-30 VITALS — BP 117/77 | Wt 185.0 lb

## 2013-05-30 DIAGNOSIS — B181 Chronic viral hepatitis B without delta-agent: Secondary | ICD-10-CM

## 2013-05-30 DIAGNOSIS — Z3480 Encounter for supervision of other normal pregnancy, unspecified trimester: Secondary | ICD-10-CM

## 2013-05-30 DIAGNOSIS — O26899 Other specified pregnancy related conditions, unspecified trimester: Secondary | ICD-10-CM

## 2013-05-30 NOTE — Progress Notes (Signed)
P-101 

## 2013-05-30 NOTE — Patient Instructions (Signed)
Contraception Choices  Contraception (birth control) is the use of any methods or devices to prevent pregnancy. Below are some methods to help avoid pregnancy.  HORMONAL METHODS   · Contraceptive implant. This is a thin, plastic tube containing progesterone hormone. It does not contain estrogen hormone. Your caregiver inserts the tube in the inner part of the upper arm. The tube can remain in place for up to 3 years. After 3 years, the implant must be removed. The implant prevents the ovaries from releasing an egg (ovulation), thickens the cervical mucus which prevents sperm from entering the uterus, and thins the lining of the inside of the uterus.  · Progesterone-only injections. These injections are given every 3 months by your caregiver to prevent pregnancy. This synthetic progesterone hormone stops the ovaries from releasing eggs. It also thickens cervical mucus and changes the uterine lining. This makes it harder for sperm to survive in the uterus.  · Birth control pills. These pills contain estrogen and progesterone hormone. They work by stopping the egg from forming in the ovary (ovulation). Birth control pills are prescribed by a caregiver. Birth control pills can also be used to treat heavy periods.  · Minipill. This type of birth control pill contains only the progesterone hormone. They are taken every day of each month and must be prescribed by your caregiver.  · Birth control patch. The patch contains hormones similar to those in birth control pills. It must be changed once a week and is prescribed by a caregiver.  · Vaginal ring. The ring contains hormones similar to those in birth control pills. It is left in the vagina for 3 weeks, removed for 1 week, and then a new one is put back in place. The patient must be comfortable inserting and removing the ring from the vagina. A caregiver's prescription is necessary.  · Emergency contraception. Emergency contraceptives prevent pregnancy after unprotected  sexual intercourse. This pill can be taken right after sex or up to 5 days after unprotected sex. It is most effective the sooner you take the pills after having sexual intercourse. Emergency contraceptive pills are available without a prescription. Check with your pharmacist. Do not use emergency contraception as your only form of birth control.  BARRIER METHODS   · Female condom. This is a thin sheath (latex or rubber) that is worn over the penis during sexual intercourse. It can be used with spermicide to increase effectiveness.  · Female condom. This is a soft, loose-fitting sheath that is put into the vagina before sexual intercourse.  · Diaphragm. This is a soft, latex, dome-shaped barrier that must be fitted by a caregiver. It is inserted into the vagina, along with a spermicidal jelly. It is inserted before intercourse. The diaphragm should be left in the vagina for 6 to 8 hours after intercourse.  · Cervical cap. This is a round, soft, latex or plastic cup that fits over the cervix and must be fitted by a caregiver. The cap can be left in place for up to 48 hours after intercourse.  · Sponge. This is a soft, circular piece of polyurethane foam. The sponge has spermicide in it. It is inserted into the vagina after wetting it and before sexual intercourse.  · Spermicides. These are chemicals that kill or block sperm from entering the cervix and uterus. They come in the form of creams, jellies, suppositories, foam, or tablets. They do not require a prescription. They are inserted into the vagina with an applicator before having sexual intercourse.   The process must be repeated every time you have sexual intercourse.  INTRAUTERINE CONTRACEPTION  · Intrauterine device (IUD). This is a T-shaped device that is put in a woman's uterus during a menstrual period to prevent pregnancy. There are 2 types:  · Copper IUD. This type of IUD is wrapped in copper wire and is placed inside the uterus. Copper makes the uterus and  fallopian tubes produce a fluid that kills sperm. It can stay in place for 10 years.  · Hormone IUD. This type of IUD contains the hormone progestin (synthetic progesterone). The hormone thickens the cervical mucus and prevents sperm from entering the uterus, and it also thins the uterine lining to prevent implantation of a fertilized egg. The hormone can weaken or kill the sperm that get into the uterus. It can stay in place for 5 years.  PERMANENT METHODS OF CONTRACEPTION  · Female tubal ligation. This is when the woman's fallopian tubes are surgically sealed, tied, or blocked to prevent the egg from traveling to the uterus.  · Female sterilization. This is when the female has the tubes that carry sperm tied off (vasectomy). This blocks sperm from entering the vagina during sexual intercourse. After the procedure, the man can still ejaculate fluid (semen).  NATURAL PLANNING METHODS  · Natural family planning. This is not having sexual intercourse or using a barrier method (condom, diaphragm, cervical cap) on days the woman could become pregnant.  · Calendar method. This is keeping track of the length of each menstrual cycle and identifying when you are fertile.  · Ovulation method. This is avoiding sexual intercourse during ovulation.  · Symptothermal method. This is avoiding sexual intercourse during ovulation, using a thermometer and ovulation symptoms.  · Post-ovulation method. This is timing sexual intercourse after you have ovulated.  Regardless of which type or method of contraception you choose, it is important that you use condoms to protect against the transmission of sexually transmitted diseases (STDs). Talk with your caregiver about which form of contraception is most appropriate for you.  Document Released: 07/19/2005 Document Revised: 10/11/2011 Document Reviewed: 11/25/2010  ExitCare® Patient Information ©2014 [employer].

## 2013-05-30 NOTE — Progress Notes (Signed)
Routine visit. Good Fm. No problems except some pelvic pressure. PTL precautions reviewed.

## 2013-06-13 ENCOUNTER — Encounter: Payer: Self-pay | Admitting: Obstetrics and Gynecology

## 2013-06-13 ENCOUNTER — Ambulatory Visit (INDEPENDENT_AMBULATORY_CARE_PROVIDER_SITE_OTHER): Payer: Medicaid Other | Admitting: Obstetrics and Gynecology

## 2013-06-13 VITALS — BP 100/67 | Wt 187.0 lb

## 2013-06-13 DIAGNOSIS — O26899 Other specified pregnancy related conditions, unspecified trimester: Secondary | ICD-10-CM

## 2013-06-13 DIAGNOSIS — Z3483 Encounter for supervision of other normal pregnancy, third trimester: Secondary | ICD-10-CM

## 2013-06-13 DIAGNOSIS — B181 Chronic viral hepatitis B without delta-agent: Secondary | ICD-10-CM

## 2013-06-13 DIAGNOSIS — Z3480 Encounter for supervision of other normal pregnancy, unspecified trimester: Secondary | ICD-10-CM

## 2013-06-13 NOTE — Progress Notes (Signed)
Patient doing well with persistent complaint of unchanged pelvic pressure. FM/PTL precautions reviewed

## 2013-06-13 NOTE — Progress Notes (Signed)
P - 104 

## 2013-06-26 ENCOUNTER — Encounter: Payer: Self-pay | Admitting: Obstetrics & Gynecology

## 2013-06-26 ENCOUNTER — Ambulatory Visit (INDEPENDENT_AMBULATORY_CARE_PROVIDER_SITE_OTHER): Payer: Medicaid Other | Admitting: Obstetrics & Gynecology

## 2013-06-26 VITALS — BP 113/73 | Wt 191.0 lb

## 2013-06-26 DIAGNOSIS — O26899 Other specified pregnancy related conditions, unspecified trimester: Secondary | ICD-10-CM

## 2013-06-26 DIAGNOSIS — Z3483 Encounter for supervision of other normal pregnancy, third trimester: Secondary | ICD-10-CM

## 2013-06-26 DIAGNOSIS — Z3480 Encounter for supervision of other normal pregnancy, unspecified trimester: Secondary | ICD-10-CM

## 2013-06-26 DIAGNOSIS — B181 Chronic viral hepatitis B without delta-agent: Secondary | ICD-10-CM

## 2013-06-26 LAB — OB RESULTS CONSOLE GBS: GBS: POSITIVE

## 2013-06-26 LAB — OB RESULTS CONSOLE GC/CHLAMYDIA: Gonorrhea: NEGATIVE

## 2013-06-26 NOTE — Progress Notes (Signed)
Patient encouraged to see PCP or urgent care for ear complaints. No fevers, no tinnitus.  Pelvic cultures done today.  No other complaints or concerns.  Fetal movement and labor precautions reviewed.

## 2013-06-26 NOTE — Progress Notes (Signed)
P-91  Patient is having ear pain on her left side that is throbbing and hurting and usually happens only at night.

## 2013-06-26 NOTE — Patient Instructions (Signed)
Return to clinic for any obstetric concerns or go to MAU for evaluation  

## 2013-07-04 LAB — GC/CHLAMYDIA PROBE AMP
CT Probe RNA: NEGATIVE
GC Probe RNA: NEGATIVE

## 2013-07-05 LAB — CULTURE, BETA STREP (GROUP B ONLY)

## 2013-07-06 ENCOUNTER — Ambulatory Visit (INDEPENDENT_AMBULATORY_CARE_PROVIDER_SITE_OTHER): Payer: Medicaid Other | Admitting: Obstetrics & Gynecology

## 2013-07-06 ENCOUNTER — Encounter: Payer: Self-pay | Admitting: Obstetrics & Gynecology

## 2013-07-06 VITALS — BP 109/66 | Wt 191.6 lb

## 2013-07-06 DIAGNOSIS — Z3483 Encounter for supervision of other normal pregnancy, third trimester: Secondary | ICD-10-CM

## 2013-07-06 DIAGNOSIS — Z3480 Encounter for supervision of other normal pregnancy, unspecified trimester: Secondary | ICD-10-CM

## 2013-07-06 NOTE — Progress Notes (Signed)
P-84  

## 2013-07-06 NOTE — Addendum Note (Signed)
Addended by: Tandy Gaw C on: 07/06/2013 11:11 AM   Modules accepted: Orders

## 2013-07-06 NOTE — Progress Notes (Signed)
Routine visit. Good FM. No problems. Labor precautions reviewed. 

## 2013-07-07 LAB — GC/CHLAMYDIA PROBE AMP, URINE: Chlamydia, Swab/Urine, PCR: NEGATIVE

## 2013-07-11 ENCOUNTER — Ambulatory Visit (INDEPENDENT_AMBULATORY_CARE_PROVIDER_SITE_OTHER): Payer: Medicaid Other | Admitting: Obstetrics & Gynecology

## 2013-07-11 VITALS — BP 126/72 | Wt 190.0 lb

## 2013-07-11 DIAGNOSIS — Z3483 Encounter for supervision of other normal pregnancy, third trimester: Secondary | ICD-10-CM

## 2013-07-11 DIAGNOSIS — O09899 Supervision of other high risk pregnancies, unspecified trimester: Secondary | ICD-10-CM

## 2013-07-11 DIAGNOSIS — Z3480 Encounter for supervision of other normal pregnancy, unspecified trimester: Secondary | ICD-10-CM

## 2013-07-11 DIAGNOSIS — Z22330 Carrier of Group B streptococcus: Secondary | ICD-10-CM

## 2013-07-11 DIAGNOSIS — O99820 Streptococcus B carrier state complicating pregnancy: Secondary | ICD-10-CM | POA: Insufficient documentation

## 2013-07-11 NOTE — Patient Instructions (Signed)
Return to clinic for any obstetric concerns or go to MAU for evaluation  

## 2013-07-11 NOTE — Progress Notes (Signed)
GBS positive, negative GC and Chlam.  Results reviewed with patient, need for treatment in labor.  Good cervical change, irregular contractions.  No other complaints or concerns.  Fetal movement and labor precautions reviewed.

## 2013-07-11 NOTE — Progress Notes (Signed)
P-89 

## 2013-07-17 ENCOUNTER — Encounter (HOSPITAL_COMMUNITY): Payer: Self-pay

## 2013-07-17 ENCOUNTER — Inpatient Hospital Stay (HOSPITAL_COMMUNITY)
Admission: AD | Admit: 2013-07-17 | Discharge: 2013-07-17 | Disposition: A | Discharge status: Home / Self Care | Payer: Medicaid Other | Source: Ambulatory Visit | Attending: Obstetrics & Gynecology | Admitting: Obstetrics & Gynecology

## 2013-07-17 ENCOUNTER — Inpatient Hospital Stay (HOSPITAL_COMMUNITY)
Admission: AD | Admit: 2013-07-17 | Discharge: 2013-07-18 | DRG: 775 | Disposition: A | Discharge status: Home / Self Care | Payer: Medicaid Other | Source: Ambulatory Visit | Attending: Obstetrics & Gynecology | Admitting: Obstetrics & Gynecology

## 2013-07-17 DIAGNOSIS — O9989 Other specified diseases and conditions complicating pregnancy, childbirth and the puerperium: Secondary | ICD-10-CM

## 2013-07-17 DIAGNOSIS — O479 False labor, unspecified: Secondary | ICD-10-CM | POA: Insufficient documentation

## 2013-07-17 DIAGNOSIS — IMO0001 Reserved for inherently not codable concepts without codable children: Secondary | ICD-10-CM

## 2013-07-17 DIAGNOSIS — Z22330 Carrier of Group B streptococcus: Secondary | ICD-10-CM

## 2013-07-17 DIAGNOSIS — O99892 Other specified diseases and conditions complicating childbirth: Principal | ICD-10-CM | POA: Diagnosis present

## 2013-07-17 DIAGNOSIS — O99334 Smoking (tobacco) complicating childbirth: Secondary | ICD-10-CM

## 2013-07-17 HISTORY — DX: Unspecified viral hepatitis B without hepatic coma: B19.10

## 2013-07-17 LAB — CBC
HCT: 36.8 % (ref 36.0–46.0)
MCH: 25.5 pg — ABNORMAL LOW (ref 26.0–34.0)
RDW: 14.9 % (ref 11.5–15.5)

## 2013-07-17 LAB — POCT FERN TEST: POCT Fern Test: NEGATIVE

## 2013-07-17 MED ORDER — DIBUCAINE 1 % RE OINT
1.0000 "application " | TOPICAL_OINTMENT | RECTAL | Status: DC | PRN
  Administered 2013-07-17: 1 via RECTAL
  Filled 2013-07-17: qty 28

## 2013-07-17 MED ORDER — PRENATAL MULTIVITAMIN CH
1.0000 | ORAL_TABLET | Freq: Every day | ORAL | Status: DC
  Administered 2013-07-17 – 2013-07-18 (×2): 1 via ORAL
  Filled 2013-07-17 (×3): qty 1

## 2013-07-17 MED ORDER — LANOLIN HYDROUS EX OINT: TOPICAL_OINTMENT | CUTANEOUS | Status: DC | PRN

## 2013-07-17 MED ORDER — PENICILLIN G POTASSIUM 5000000 UNITS IJ SOLR
5.0000 10*6.[IU] | Freq: Once | INTRAVENOUS | Status: DC
  Filled 2013-07-17: qty 5

## 2013-07-17 MED ORDER — OXYTOCIN 40 UNITS IN LACTATED RINGERS INFUSION - SIMPLE MED
62.5000 mL/h | INTRAVENOUS | Status: DC
  Filled 2013-07-17: qty 1000

## 2013-07-17 MED ORDER — SIMETHICONE 80 MG PO CHEW: 80.0000 mg | CHEWABLE_TABLET | ORAL | Status: DC | PRN

## 2013-07-17 MED ORDER — OXYCODONE-ACETAMINOPHEN 5-325 MG PO TABS
1.0000 | ORAL_TABLET | ORAL | Status: DC | PRN
  Administered 2013-07-17: 2 via ORAL
  Administered 2013-07-17: 1 via ORAL
  Administered 2013-07-17 – 2013-07-18 (×5): 2 via ORAL
  Filled 2013-07-17: qty 1
  Filled 2013-07-17 (×6): qty 2

## 2013-07-17 MED ORDER — PENICILLIN G POTASSIUM 5000000 UNITS IJ SOLR
2.5000 10*6.[IU] | INTRAMUSCULAR | Status: DC
  Filled 2013-07-17 (×2): qty 2.5

## 2013-07-17 MED ORDER — BENZOCAINE-MENTHOL 20-0.5 % EX AERO
1.0000 "application " | INHALATION_SPRAY | CUTANEOUS | Status: DC | PRN
  Administered 2013-07-17: 1 via TOPICAL
  Filled 2013-07-17: qty 56

## 2013-07-17 MED ORDER — LIDOCAINE HCL (PF) 1 % IJ SOLN
30.0000 mL | INTRAMUSCULAR | Status: DC | PRN
  Filled 2013-07-17 (×2): qty 30

## 2013-07-17 MED ORDER — SENNOSIDES-DOCUSATE SODIUM 8.6-50 MG PO TABS
2.0000 | ORAL_TABLET | ORAL | Status: DC
  Administered 2013-07-17: 2 via ORAL
  Filled 2013-07-17: qty 2

## 2013-07-17 MED ORDER — LACTATED RINGERS IV SOLN
INTRAVENOUS | Status: DC
  Administered 2013-07-17: 06:00:00 via INTRAVENOUS

## 2013-07-17 MED ORDER — ONDANSETRON HCL 4 MG/2ML IJ SOLN: 4.0000 mg | Freq: Four times a day (QID) | INTRAMUSCULAR | Status: DC | PRN

## 2013-07-17 MED ORDER — OXYCODONE-ACETAMINOPHEN 5-325 MG PO TABS
1.0000 | ORAL_TABLET | ORAL | Status: DC | PRN
Start: 2013-07-17 — End: 2013-07-17
  Administered 2013-07-17: 1 via ORAL
  Filled 2013-07-17: qty 1

## 2013-07-17 MED ORDER — OXYTOCIN BOLUS FROM INFUSION
500.0000 mL | INTRAVENOUS | Status: DC
  Administered 2013-07-17: 500 mL via INTRAVENOUS

## 2013-07-17 MED ORDER — ONDANSETRON HCL 4 MG PO TABS: 4.0000 mg | ORAL_TABLET | ORAL | Status: DC | PRN

## 2013-07-17 MED ORDER — AMPICILLIN SODIUM 2 G IJ SOLR
2.0000 g | Freq: Four times a day (QID) | INTRAMUSCULAR | Status: DC
  Administered 2013-07-17: 2 g via INTRAVENOUS
  Filled 2013-07-17 (×2): qty 2000

## 2013-07-17 MED ORDER — ZOLPIDEM TARTRATE 5 MG PO TABS: 5.0000 mg | ORAL_TABLET | Freq: Every evening | ORAL | Status: DC | PRN

## 2013-07-17 MED ORDER — ONDANSETRON HCL 4 MG/2ML IJ SOLN: 4.0000 mg | INTRAMUSCULAR | Status: DC | PRN

## 2013-07-17 MED ORDER — WITCH HAZEL-GLYCERIN EX PADS
1.0000 "application " | MEDICATED_PAD | CUTANEOUS | Status: DC | PRN
  Administered 2013-07-17: 1 via TOPICAL

## 2013-07-17 MED ORDER — DIPHENHYDRAMINE HCL 25 MG PO CAPS: 25.0000 mg | ORAL_CAPSULE | Freq: Four times a day (QID) | ORAL | Status: DC | PRN

## 2013-07-17 MED ORDER — FENTANYL CITRATE 0.05 MG/ML IJ SOLN
100.0000 ug | INTRAMUSCULAR | Status: DC | PRN
  Administered 2013-07-17: 100 ug via INTRAVENOUS
  Filled 2013-07-17: qty 2

## 2013-07-17 MED ORDER — LACTATED RINGERS IV SOLN: 500.0000 mL | INTRAVENOUS | Status: DC | PRN

## 2013-07-17 MED ORDER — FLEET ENEMA 7-19 GM/118ML RE ENEM: 1.0000 | ENEMA | RECTAL | Status: DC | PRN

## 2013-07-17 MED ORDER — IBUPROFEN 600 MG PO TABS
600.0000 mg | ORAL_TABLET | Freq: Four times a day (QID) | ORAL | Status: DC
  Administered 2013-07-17 – 2013-07-18 (×6): 600 mg via ORAL
  Filled 2013-07-17 (×7): qty 1

## 2013-07-17 MED ORDER — CITRIC ACID-SODIUM CITRATE 334-500 MG/5ML PO SOLN: 30.0000 mL | ORAL | Status: DC | PRN

## 2013-07-17 MED ORDER — TETANUS-DIPHTH-ACELL PERTUSSIS 5-2.5-18.5 LF-MCG/0.5 IM SUSP: 0.5000 mL | Freq: Once | INTRAMUSCULAR | Status: DC

## 2013-07-17 MED ORDER — ACETAMINOPHEN 325 MG PO TABS: 650.0000 mg | ORAL_TABLET | ORAL | Status: DC | PRN

## 2013-07-17 MED ORDER — IBUPROFEN 600 MG PO TABS
600.0000 mg | ORAL_TABLET | Freq: Four times a day (QID) | ORAL | Status: DC | PRN
  Administered 2013-07-17: 600 mg via ORAL
  Filled 2013-07-17: qty 1

## 2013-07-17 NOTE — Progress Notes (Signed)
UR chart review completed.  

## 2013-07-17 NOTE — H&P (Signed)
Stacey Carrillo is a 22 y.o. female [phone] with IUP at [redacted]w[redacted]d presenting for active labor. Pt states she has been having regular, every 2-4 minutes contractions, associated with spotting vaginal bleeding with mucus.  Membranes are intact, ferning negative, with active fetal movement.   [facility] 12 wks  Denies fever/chills, headache, vision changes, chest pain, RUQ pain, nausea/vomiting, swelling/tenderness in legs, dysuria, flank pain.   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 Anemia   . Preterm labor   . Group B streptococcal infection   . HPV (human papilloma virus) infection   . Abnormal Pap smear     Past Surgical History: Past Surgical History  Procedure Laterality Date  . Dilation and curettage of uterus      Obstetrical History: OB History   Grav Para Term Preterm Abortions TAB SAB Ect Mult Living   7 3 2 1 3 2 1  0 0 3      Social History: History   Social History  . Marital Status: Single    Spouse Name: N/A    Number of Children: N/A  . Years of Education: N/A   Social History Main Topics  . Smoking status: Current Some Day Smoker -- 0.25 packs/day    Types: Cigarettes  . Smokeless tobacco: Never Used     Comment: pt stated she is trying to quite down to 1 ciggarette/day  . Alcohol Use: Yes     Comment: social when not pregnant  . Drug Use: No  . Sexual Activity: Yes    Partners: Male    Birth Control/ Protection: None   Other Topics Concern  . None   Social History Narrative  . None    Family History: Family History  Problem Relation Age of Onset  . Hearing loss Brother   . Asthma Son     Allergies: No Known Allergies  Prescriptions prior to admission  Medication Sig Dispense Refill  . Prenatal Multivit-Min-Fe-FA (PRENATAL VITAMINS) 0.8 MG tablet Take 1 tablet by mouth daily.  30 tablet  12     Review of Systems  All systems reviewed and  negative except as stated in HPI    Last menstrual period 11/23/2012. General appearance: alert, cooperative and moderate distress Lungs: clear to auscultation bilaterally Heart: regular rate and rhythm Abdomen: soft, non-tender; bowel sounds normal Pelvic: bloody show; ferning negative Extremities: Homans sign is negative, no sign of DVT DTR's 2+ Presentation: cephalic Fetal monitoringBaseline: 150 bpm, Variability: Good {> 6 bpm), Accelerations: none and Decelerations: Variable Uterine activityFrequency: Every 1-2 minutes, Duration: 30-45 seconds and Intensity: strong Dilation: 6 Effacement (%): 90 Station: -2 Exam by:: D Simpson RN   Prenatal labs: ABO, Rh: A/POS/-- (06/25 1553) Antibody: NEG (06/25 1553) Rubella:   RPR: NON REAC (09/30 1127)  HBsAg: SEE NOTE (06/25 1553)  HIV: NON REACTIVE (09/30 1127)  GBS: Positive (11/25 0000)  1 hr Glucola 123 Genetic screening  Quad normal Anatomy US normal  Results for orders placed during the hospital encounter of 07/17/13 (from the past 24 hour(s))  POCT FERN TEST     Status: None   Collection Time    07/17/13  3:04 AM      Result Value Range   POCT Fern Test Negative = intact amniotic membranes       Assessment: Stacey Carrillo is a 22 y.o. [phone] with an IUP at [redacted]w[redacted]d presenting for spontaneous onset of labor.  Plan:  Admit to L&D #Labor: spontaneous onset, progressing normally #Pain: undecided on epidural, would like IV pain meds at this time #FWB: Cat II, continue monitoring #ID: GBS pos will start Amp at admission Hepatitis B carrier Breastfeeding DepoProvera for contraception    Pior, Jearld Lesch, MD 07/17/2013, 6:13 AM   I have seen and examined this patient and agree with above documentation in the resident's note. Pt presented in advanced labor and rapidly progressed to delivery. Had not finished first dose of ampicillin at time of delivery.    Rulon Abide, M.D. [facility] Fellow 07/17/2013 7:03 AM

## 2013-07-17 NOTE — Lactation Note (Signed)
This note was copied from the chart of Stacey [facility]. Lactation Consultation Note     Follow up consult with this mom of a term baby, now 55 hours old. Mom has breast fed her other children. She thought she needed to feel pinching with a good latch. I explained that it is just the opposite, and showed mom pictures of a deep latch in the Baby and Me book. She latched well in football hold, and I showed mom how to keep her hand on baby's back as opposed to hr head, and to tilt baby's head back with latch, to pull baby's bottom lip out. The baby suckled well with good breast movement, for about 7 minutes.  Mom says she has forgotten, and I told her all moms seem to remember how to breast feed the older baby, and forget how different the first 2 weeks of breast feeding are. Mom seemed pleased once she realized "pinching" should not happen, and that the baby alread had had a wet and dirty diaper, shows he is dong well. Mom knows to call for questions/concerns.  Patient Name: Stacey Carrillo [id number] Date: 07/17/2013 Reason for consult: Follow-up assessment   Maternal Data Formula Feeding for Exclusion: No Infant to breast within first hour of birth: Yes Has patient been taught Hand Expression?: Yes Does the patient have breastfeeding experience prior to this delivery?: Yes  Feeding Feeding Type: Breast Fed Nipple Type: Slow - flow Length of feed: 7 min (stron suckles and swallows noted)  LATCH Score/Interventions Latch: Repeated attempts needed to sustain latch, nipple held in mouth throughout feeding, stimulation needed to elicit sucking reflex. Intervention(s): Skin to skin;Teach feeding cues;Waking techniques Intervention(s): Adjust position;Assist with latch;Breast compression  Audible Swallowing: A few with stimulation  Type of Nipple: Everted at rest and after stimulation (nipple both everted and somewhat inverted)  Comfort (Breast/Nipple): Soft / non-tender     Hold (Positioning):  Assistance needed to correctly position infant at breast and maintain latch.  LATCH Score: 7  Lactation Tools Discussed/Used     Consult Status Consult Status: Follow-up Date: 07/18/13 Follow-up type: In-patient    Alfred Levins 07/17/2013, 6:37 PM

## 2013-07-17 NOTE — MAU Note (Signed)
Pt c/o uc since 830pm. Has not been timing them. C/o some bloody show as well.

## 2013-07-17 NOTE — MAU Note (Signed)
Pt also c/o some LOF for a couple of days.

## 2013-07-18 ENCOUNTER — Telehealth: Payer: Self-pay

## 2013-07-18 ENCOUNTER — Ambulatory Visit: Payer: Self-pay

## 2013-07-18 ENCOUNTER — Encounter: Payer: Medicaid Other | Admitting: Obstetrics and Gynecology

## 2013-07-18 LAB — CBC
Hemoglobin: 9.9 g/dL — ABNORMAL LOW (ref 12.0–15.0)
MCH: 25.3 pg — ABNORMAL LOW (ref 26.0–34.0)
MCHC: 32.9 g/dL (ref 30.0–36.0)
MCV: 76.8 fL — ABNORMAL LOW (ref 78.0–100.0)
Platelets: 155 10*3/uL (ref 150–400)
RBC: 3.92 MIL/uL (ref 3.87–5.11)
RDW: 15.3 % (ref 11.5–15.5)
WBC: 11.8 10*3/uL — ABNORMAL HIGH (ref 4.0–10.5)

## 2013-07-18 MED ORDER — IBUPROFEN 600 MG PO TABS: 600.0000 mg | ORAL_TABLET | Freq: Four times a day (QID) | ORAL | Status: DC

## 2013-07-18 MED ORDER — OXYCODONE-ACETAMINOPHEN 5-325 MG PO TABS: 1.0000 | ORAL_TABLET | ORAL | Status: DC | PRN

## 2013-07-18 NOTE — Discharge Summary (Signed)
Obstetric Discharge Summary Reason for Admission: onset of labor Prenatal Procedures: NST and ultrasound Intrapartum Procedures: spontaneous vaginal delivery Postpartum Procedures: none Complications-Operative and Postpartum: none Hemoglobin  Date Value Range Status  07/18/2013 9.9* 12.0 - 15.0 g/dL Final     DELTA CHECK NOTED     REPEATED TO VERIFY     HCT  Date Value Range Status  07/18/2013 30.1* 36.0 - 46.0 % Final   Brief Hospital Course:  Stacey Carrillo is a 22 y.o. G7 now P3134 PPD#1 s/p NSVD at [redacted]w[redacted]d. Received PCN for ppx of GBS transmission while in active labor. She is breastfeeding and doing well. She will be discharged and will room-in while her newborn girl remains admitted. She will follow up in 6 weeks and desires depo for contraception.   Physical Exam:  General: alert, cooperative and no distress Lochia: appropriate Uterine Fundus: firm Incision: n/a DVT Evaluation: No evidence of DVT seen on physical exam. Negative Homan's sign. No cords or calf tenderness.  Discharge Diagnoses: Term Pregnancy-delivered  Discharge Information: Date: 07/18/2013 Activity: pelvic rest Diet: routine Medications: PNV and Ibuprofen Condition: stable Instructions: refer to practice specific booklet Discharge to: home and will room-in until newborn is discharged.  Follow-up Information   [facility] up with [facility] In 6 weeks.   Specialty:  Obstetrics and Gynecology   Contact information:   [address] [place] [address] [phone]      Newborn Data: Live born female  Birth Weight: 7 lb 1.8 oz (3225 g) APGAR: 8, 9  Remaining admitted for observation. Will go home with mother.  Hazeline Junker 07/18/2013, 1:06 PM  I was present for the exam and agree with above.  Dorathy Kinsman, CNM 07/19/2013 2:10 AM

## 2013-07-18 NOTE — Progress Notes (Signed)
Post Partum Day #1 Subjective: Pain well controlled, lochia decreasing, eating, drinking, voiding, + flatus/- BM. Breastfeeding well.  Denies SOB, dizziness.   Objective: Blood pressure 93/53, pulse 99, temperature 97.9 F (36.6 C), temperature source Oral, resp. rate 17, height 5\' 7"  (1.702 m), weight 86.183 kg (190 lb), last menstrual period 11/23/2012, SpO2 97.00%, unknown if currently breastfeeding.  Physical Exam:  General: alert, cooperative and no distress Lochia: appropriate Uterine Fundus: firm Incision: n/a DVT Evaluation: No evidence of DVT seen on physical exam. Negative Homan's sign. No cords or calf tenderness. No significant calf/ankle edema.  Recent Labs  07/17/13 0555 07/18/13 0618  HGB 12.3 9.9*  HCT 36.8 30.1*   Assessment/Plan: Stacey Carrillo is a 22 y.o. G7 now P3134 PPD#1 s/p NSVD at [redacted]w[redacted]d. Doing well.  Plan for discharge tomorrow, Breastfeeding and Contraception Depo   LOS: 1 day   Hazeline Junker 07/18/2013, 7:48 AM

## 2013-07-18 NOTE — Telephone Encounter (Signed)
Faxed labs on Hep B to Tammy from the [facility]. Vaccination purposes for follow up on baby.

## 2013-07-18 NOTE — Progress Notes (Signed)
I was present for the exam and agree with above.  [place], CNM 07/18/2013 9:11 AM

## 2013-07-18 NOTE — Lactation Note (Signed)
This note was copied from the chart of [facility]. Lactation Consultation Note  Follow up visit at 35 hours of age.  FOB giving formula in the bottle.  Mom reports a short feeding and baby wont take right breast.  Encouraged to feed on left side first and then try.  Hand expression by mom and colostrum is visible.  Encouraged to breast before bottles and explained baby does not have  A medical need for formula right now.  Discussed cluster feeding.  Encouraged mom to call for assist as needed.   Patient Name: Stacey Carrillo Date: 07/18/2013 Reason for consult: Follow-up assessment   Maternal Data    Feeding    LATCH Score/Interventions                      Lactation Tools Discussed/Used     Consult Status Consult Status: PRN    Jannifer Rodney 07/18/2013, 8:43 PM

## 2013-07-19 NOTE — Discharge Summary (Signed)
Attestation of Attending Supervision of Advanced Practitioner (CNM/NP): Evaluation and management procedures were performed by the Advanced Practitioner under my supervision and collaboration.  I have reviewed the Advanced Practitioner's note and chart, and I agree with the management and plan.  HARRAWAY-SMITH, Leimomi Zervas 3:37 PM      

## 2013-07-24 ENCOUNTER — Ambulatory Visit (HOSPITAL_COMMUNITY)
Admission: RE | Admit: 2013-07-24 | Discharge: 2013-07-24 | Disposition: A | Discharge status: Home / Self Care | Payer: Medicaid Other | Source: Ambulatory Visit | Attending: Family Medicine | Admitting: Family Medicine

## 2013-10-09 ENCOUNTER — Ambulatory Visit: Payer: Medicaid Other | Admitting: Obstetrics & Gynecology

## 2014-02-05 ENCOUNTER — Ambulatory Visit (INDEPENDENT_AMBULATORY_CARE_PROVIDER_SITE_OTHER): Payer: Medicaid Other | Admitting: Family Medicine

## 2014-02-05 ENCOUNTER — Encounter: Payer: Self-pay | Admitting: Family Medicine

## 2014-02-05 VITALS — BP 110/68 | HR 79 | Wt 193.0 lb

## 2014-02-05 DIAGNOSIS — B181 Chronic viral hepatitis B without delta-agent: Secondary | ICD-10-CM

## 2014-02-05 DIAGNOSIS — Z3481 Encounter for supervision of other normal pregnancy, first trimester: Secondary | ICD-10-CM

## 2014-02-05 DIAGNOSIS — O26899 Other specified pregnancy related conditions, unspecified trimester: Secondary | ICD-10-CM

## 2014-02-05 DIAGNOSIS — O98419 Viral hepatitis complicating pregnancy, unspecified trimester: Secondary | ICD-10-CM

## 2014-02-05 DIAGNOSIS — Z3480 Encounter for supervision of other normal pregnancy, unspecified trimester: Secondary | ICD-10-CM

## 2014-02-05 MED ORDER — PRENATAL VITAMINS 0.8 MG PO TABS: 1.0000 | ORAL_TABLET | Freq: Every day | ORAL | Status: DC

## 2014-02-05 NOTE — Patient Instructions (Signed)
Pregnancy - First Trimester During sexual intercourse, millions of sperm go into the vagina. Only 1 sperm will penetrate and fertilize the female egg while it is in the Fallopian tube. One week later, the fertilized egg implants into the wall of the uterus. An embryo begins to develop into a baby. At 6 to 8 weeks, the eyes and face are formed and the heartbeat can be seen on ultrasound. At the end of 12 weeks (first trimester), all the baby's organs are formed. Now that you are pregnant, you will want to do everything you can to have a healthy baby. Two of the most important things are to get good prenatal care and follow your caregiver's instructions. Prenatal care is all the medical care you receive before the baby's birth. It is given to prevent, find, and treat problems during the pregnancy and childbirth. PRENATAL EXAMS  During prenatal visits, your weight, blood pressure, and urine are checked. This is done to make sure you are healthy and progressing normally during the pregnancy.  A pregnant woman should gain 25 to 35 pounds during the pregnancy. However, if you are overweight or underweight, your caregiver will advise you regarding your weight.  Your caregiver will ask and answer questions for you.  Blood work, cervical cultures, other necessary tests, and a Pap test are done during your prenatal exams. These tests are done to check on your health and the probable health of your baby. Tests are strongly recommended and done for HIV with your permission. This is the virus that causes AIDS. These tests are done because medicines can be given to help prevent your baby from being born with this infection should you have been infected without knowing it. Blood work is also used to find out your blood type, previous infections, and follow your blood levels (hemoglobin).  Low hemoglobin (anemia) is common during pregnancy. Iron and vitamins are given to help prevent this. Later in the pregnancy,  blood tests for diabetes will be done along with any other tests if any problems develop.  You may need other tests to make sure you and the baby are doing well. CHANGES DURING THE FIRST TRIMESTER  Your body goes through many changes during pregnancy. They vary from person to person. Talk to your caregiver about changes you notice and are concerned about. Changes can include:  Your menstrual period stops.  The egg and sperm carry the genes that determine what you look like. Genes from you and your partner are forming a baby. The female genes determine whether the baby is a boy or a girl.  Your body increases in girth and you may feel bloated.  Feeling sick to your stomach (nauseous) and throwing up (vomiting). If the vomiting is uncontrollable, call your caregiver.  Your breasts will begin to enlarge and become tender.  Your nipples may stick out more and become darker.  The need to urinate more. Painful urination may mean you have a bladder infection.  Tiring easily.  Loss of appetite.  Cravings for certain kinds of food.  At first, you may gain or lose a couple of pounds.  You may have changes in your emotions from day to day (excited to be pregnant or concerned something may go wrong with the pregnancy and baby).  You may have more vivid and strange dreams. HOME CARE INSTRUCTIONS   It is very important to avoid all smoking, alcohol and non-prescribed drugs during your pregnancy. These affect the formation and growth of the baby.   Avoid chemicals while pregnant to ensure the delivery of a healthy infant.  Start your prenatal visits by the 12th week of pregnancy. They are usually scheduled monthly at first, then more often in the last 2 months before delivery. Keep your caregiver's appointments. Follow your caregiver's instructions regarding medicine use, blood and lab tests, exercise, and diet.  During pregnancy, you are providing food for you and your baby. Eat regular,  well-balanced meals. Choose foods such as meat, fish, milk and other low fat dairy products, vegetables, fruits, and whole-grain breads and cereals. Your caregiver will tell you of the ideal weight gain.  You can help morning sickness by keeping soda crackers at the bedside. Eat a couple before arising in the morning. You may want to use the crackers without salt on them.  Eating 4 to 5 small meals rather than 3 large meals a day also may help the nausea and vomiting.  Drinking liquids between meals instead of during meals also seems to help nausea and vomiting.  A physical sexual relationship may be continued throughout pregnancy if there are no other problems. Problems may be early (premature) leaking of amniotic fluid from the membranes, vaginal bleeding, or belly (abdominal) pain.  Exercise regularly if there are no restrictions. Check with your caregiver or physical therapist if you are unsure of the safety of some of your exercises. Greater weight gain will occur in the last 2 trimesters of pregnancy. Exercising will help:  Control your weight.  Keep you in shape.  Prepare you for labor and delivery.  Help you lose your pregnancy weight after you deliver your baby.  Wear a good support or jogging bra for breast tenderness during pregnancy. This may help if worn during sleep too.  Ask when prenatal classes are available. Begin classes when they are offered.  Do not use hot tubs, steam rooms, or saunas.  Wear your seat belt when driving. This protects you and your baby if you are in an accident.  Avoid raw meat, uncooked cheese, cat litter boxes, and soil used by cats throughout the pregnancy. These carry germs that can cause birth defects in the baby.  The first trimester is a good time to visit your dentist for your dental health. Getting your teeth cleaned is okay. Use a softer toothbrush and brush gently during pregnancy.  Ask for help if you have financial, counseling, or  nutritional needs during pregnancy. Your caregiver will be able to offer counseling for these needs as well as refer you for other special needs.  Do not take any medicines or herbs unless told by your caregiver.  Inform your caregiver if there is any mental or physical domestic violence.  Make a list of emergency phone numbers of family, friends, hospital, and police and fire departments.  Write down your questions. Take them to your prenatal visit.  Do not douche.  Do not cross your legs.  If you have to stand for long periods of time, rotate you feet or take small steps in a circle.  You may have more vaginal secretions that may require a sanitary pad. Do not use tampons or scented sanitary pads. MEDICINES AND DRUG USE IN PREGNANCY  Take prenatal vitamins as directed. The vitamin should contain 1 milligram of folic acid. Keep all vitamins out of reach of children. Only a couple vitamins or tablets containing iron may be fatal to a baby or young child when ingested.  Avoid use of all medicines, including herbs, over-the-counter medicines, not   prescribed or suggested by your caregiver. Only take over-the-counter or prescription medicines for pain, discomfort, or fever as directed by your caregiver. Do not use aspirin, ibuprofen, or naproxen unless directed by your caregiver.  Let your caregiver also know about herbs you may be using.  Alcohol is related to a number of birth defects. This includes fetal alcohol syndrome. All alcohol, in any form, should be avoided completely. Smoking will cause low birth rate and premature babies.  Street or illegal drugs are very harmful to the baby. They are absolutely forbidden. A baby born to an addicted mother will be addicted at birth. The baby will go through the same withdrawal an adult does.  Let your caregiver know about any medicines that you have to take and for what reason you take them. SEEK MEDICAL CARE IF:  You have any concerns or  worries during your pregnancy. It is better to call with your questions if you feel they cannot wait, rather than worry about them. SEEK IMMEDIATE MEDICAL CARE IF:   An unexplained oral temperature above 102 F (38.9 C) develops, or as your caregiver suggests.  You have leaking of fluid from the vagina (birth canal). If leaking membranes are suspected, take your temperature and inform your caregiver of this when you call.  There is vaginal spotting or bleeding. Notify your caregiver of the amount and how many pads are used.  You develop a bad smelling vaginal discharge with a change in the color.  You continue to feel sick to your stomach (nauseated) and have no relief from remedies suggested. You vomit blood or coffee ground-like materials.  You lose more than 2 pounds of weight in 1 week.  You gain more than 2 pounds of weight in 1 week and you notice swelling of your face, hands, feet, or legs.  You gain 5 pounds or more in 1 week (even if you do not have swelling of your hands, face, legs, or feet).  You get exposed to German measles and have never had them.  You are exposed to fifth disease or chickenpox.  You develop belly (abdominal) pain. Round ligament discomfort is a common non-cancerous (benign) cause of abdominal pain in pregnancy. Your caregiver still must evaluate this.  You develop headache, fever, diarrhea, pain with urination, or shortness of breath.  You fall or are in a car accident or have any kind of trauma.  There is mental or physical violence in your home. Document Released: 07/13/2001 Document Revised: 04/12/2012 Document Reviewed: 05/29/2013 [employer] Patient Information 2015 [employer]. This information is not intended to replace advice given to you by your health care provider. Make sure you discuss any questions you have with your health care provider.  Breastfeeding Deciding to breastfeed is one of the best choices you can make for you and your  baby. A change in hormones during pregnancy causes your breast tissue to grow and increases the number and size of your milk ducts. These hormones also allow proteins, sugars, and fats from your blood supply to make breast milk in your milk-producing glands. Hormones prevent breast milk from being released before your baby is born as well as prompt milk flow after birth. Once breastfeeding has begun, thoughts of your baby, as well as his or her sucking or crying, can stimulate the release of milk from your milk-producing glands.  BENEFITS OF BREASTFEEDING For Your Baby  Your first milk (colostrum) helps your baby's digestive system function better.   There are antibodies in   your milk that help your baby fight off infections.   Your baby has a lower incidence of asthma, allergies, and sudden infant death syndrome.   The nutrients in breast milk are better for your baby than infant formulas and are designed uniquely for your baby's needs.   Breast milk improves your baby's brain development.   Your baby is less likely to develop other conditions, such as childhood obesity, asthma, or type 2 diabetes mellitus.  For You   Breastfeeding helps to create a very special bond between you and your baby.   Breastfeeding is convenient. Breast milk is always available at the correct temperature and costs nothing.   Breastfeeding helps to burn calories and helps you lose the weight gained during pregnancy.   Breastfeeding makes your uterus contract to its prepregnancy size faster and slows bleeding (lochia) after you give birth.   Breastfeeding helps to lower your risk of developing type 2 diabetes mellitus, osteoporosis, and breast or ovarian cancer later in life. SIGNS THAT YOUR BABY IS HUNGRY Early Signs of Hunger  Increased alertness or activity.  Stretching.  Movement of the head from side to side.  Movement of the head and opening of the mouth when the corner of the mouth or  cheek is stroked (rooting).  Increased sucking sounds, smacking lips, cooing, sighing, or squeaking.  Hand-to-mouth movements.  Increased sucking of fingers or hands. Late Signs of Hunger  Fussing.  Intermittent crying. Extreme Signs of Hunger Signs of extreme hunger will require calming and consoling before your baby will be able to breastfeed successfully. Do not wait for the following signs of extreme hunger to occur before you initiate breastfeeding:   Restlessness.  A loud, strong cry.   Screaming. BREASTFEEDING BASICS Breastfeeding Initiation  Find a comfortable place to sit or lie down, with your neck and back well supported.  Place a pillow or rolled up blanket under your baby to bring him or her to the level of your breast (if you are seated). Nursing pillows are specially designed to help support your arms and your baby while you breastfeed.  Make sure that your baby's abdomen is facing your abdomen.   Gently massage your breast. With your fingertips, massage from your chest wall toward your nipple in a circular motion. This encourages milk flow. You may need to continue this action during the feeding if your milk flows slowly.  Support your breast with 4 fingers underneath and your thumb above your nipple. Make sure your fingers are well away from your nipple and your baby's mouth.   Stroke your baby's lips gently with your finger or nipple.   When your baby's mouth is open wide enough, quickly bring your baby to your breast, placing your entire nipple and as much of the colored area around your nipple (areola) as possible into your baby's mouth.   More areola should be visible above your baby's upper lip than below the lower lip.   Your baby's tongue should be between his or her lower gum and your breast.   Ensure that your baby's mouth is correctly positioned around your nipple (latched). Your baby's lips should create a seal on your breast and be turned  out (everted).  It is common for your baby to suck about 2-3 minutes in order to start the flow of breast milk. Latching Teaching your baby how to latch on to your breast properly is very important. An improper latch can cause nipple pain and decreased milk supply   for you and poor weight gain in your baby. Also, if your baby is not latched onto your nipple properly, he or she may swallow some air during feeding. This can make your baby fussy. Burping your baby when you switch breasts during the feeding can help to get rid of the air. However, teaching your baby to latch on properly is still the best way to prevent fussiness from swallowing air while breastfeeding. Signs that your baby has successfully latched on to your nipple:    Silent tugging or silent sucking, without causing you pain.   Swallowing heard between every 3-4 sucks.    Muscle movement above and in front of his or her ears while sucking.  Signs that your baby has not successfully latched on to nipple:   Sucking sounds or smacking sounds from your baby while breastfeeding.  Nipple pain. If you think your baby has not latched on correctly, slip your finger into the corner of your baby's mouth to break the suction and place it between your baby's gums. Attempt breastfeeding initiation again. Signs of Successful Breastfeeding Signs from your baby:   A gradual decrease in the number of sucks or complete cessation of sucking.   Falling asleep.   Relaxation of his or her body.   Retention of a small amount of milk in his or her mouth.   Letting go of your breast by himself or herself. Signs from you:  Breasts that have increased in firmness, weight, and size 1-3 hours after feeding.   Breasts that are softer immediately after breastfeeding.  Increased milk volume, as well as a change in milk consistency and color by the fifth day of breastfeeding.   Nipples that are not sore, cracked, or bleeding. Signs That  Your Baby is Getting Enough Milk  Wetting at least 3 diapers in a 24-hour period. The urine should be clear and pale yellow by age 5 days.  At least 3 stools in a 24-hour period by age 5 days. The stool should be soft and yellow.  At least 3 stools in a 24-hour period by age 7 days. The stool should be seedy and yellow.  No loss of weight greater than 10% of birth weight during the first 3 days of age.  Average weight gain of 4-7 ounces (113-198 g) per week after age 4 days.  Consistent daily weight gain by age 5 days, without weight loss after the age of 2 weeks. After a feeding, your baby may spit up a small amount. This is common. BREASTFEEDING FREQUENCY AND DURATION Frequent feeding will help you make more milk and can prevent sore nipples and breast engorgement. Breastfeed when you feel the need to reduce the fullness of your breasts or when your baby shows signs of hunger. This is called "breastfeeding on demand." Avoid introducing a pacifier to your baby while you are working to establish breastfeeding (the first 4-6 weeks after your baby is born). After this time you may choose to use a pacifier. Research has shown that pacifier use during the first year of a baby's life decreases the risk of sudden infant death syndrome (SIDS). Allow your baby to feed on each breast as long as he or she wants. Breastfeed until your baby is finished feeding. When your baby unlatches or falls asleep while feeding from the first breast, offer the second breast. Because newborns are often sleepy in the first few weeks of life, you may need to awaken your baby to get him or   her to feed. Breastfeeding times will vary from baby to baby. However, the following rules can serve as a guide to help you ensure that your baby is properly fed:  Newborns (babies 4 weeks of age or younger) may breastfeed every 1-3 hours.  Newborns should not go longer than 3 hours during the day or 5 hours during the night without  breastfeeding.  You should breastfeed your baby a minimum of 8 times in a 24-hour period until you begin to introduce solid foods to your baby at around 6 months of age. BREAST MILK PUMPING Pumping and storing breast milk allows you to ensure that your baby is exclusively fed your breast milk, even at times when you are unable to breastfeed. This is especially important if you are going back to work while you are still breastfeeding or when you are not able to be present during feedings. Your lactation consultant can give you guidelines on how long it is safe to store breast milk.  A breast pump is a machine that allows you to pump milk from your breast into a sterile bottle. The pumped breast milk can then be stored in a refrigerator or freezer. Some breast pumps are operated by hand, while others use electricity. Ask your lactation consultant which type will work best for you. Breast pumps can be purchased, but some hospitals and breastfeeding support groups lease breast pumps on a monthly basis. A lactation consultant can teach you how to hand express breast milk, if you prefer not to use a pump.  CARING FOR YOUR BREASTS WHILE YOU BREASTFEED Nipples can become dry, cracked, and sore while breastfeeding. The following recommendations can help keep your breasts moisturized and healthy:  Avoid using soap on your nipples.   Wear a supportive bra. Although not required, special nursing bras and tank tops are designed to allow access to your breasts for breastfeeding without taking off your entire bra or top. Avoid wearing underwire-style bras or extremely tight bras.  Air dry your nipples for 3-4minutes after each feeding.   Use only cotton bra pads to absorb leaked breast milk. Leaking of breast milk between feedings is normal.   Use lanolin on your nipples after breastfeeding. Lanolin helps to maintain your skin's normal moisture barrier. If you use pure lanolin, you do not need to wash it off  before feeding your baby again. Pure lanolin is not toxic to your baby. You may also hand express a few drops of breast milk and gently massage that milk into your nipples and allow the milk to air dry. In the first few weeks after giving birth, some women experience extremely full breasts (engorgement). Engorgement can make your breasts feel heavy, warm, and tender to the touch. Engorgement peaks within 3-5 days after you give birth. The following recommendations can help ease engorgement:  Completely empty your breasts while breastfeeding or pumping. You may want to start by applying warm, moist heat (in the shower or with warm water-soaked hand towels) just before feeding or pumping. This increases circulation and helps the milk flow. If your baby does not completely empty your breasts while breastfeeding, pump any extra milk after he or she is finished.  Wear a snug bra (nursing or regular) or tank top for 1-2 days to signal your body to slightly decrease milk production.  Apply ice packs to your breasts, unless this is too uncomfortable for you.  Make sure that your baby is latched on and positioned properly while breastfeeding. If engorgement   persists after 48 hours of following these recommendations, contact your health care provider or a lactation consultant. OVERALL HEALTH CARE RECOMMENDATIONS WHILE BREASTFEEDING  Eat healthy foods. Alternate between meals and snacks, eating 3 of each per day. Because what you eat affects your breast milk, some of the foods may make your baby more irritable than usual. Avoid eating these foods if you are sure that they are negatively affecting your baby.  Drink milk, fruit juice, and water to satisfy your thirst (about 10 glasses a day).   Rest often, relax, and continue to take your prenatal vitamins to prevent fatigue, stress, and anemia.  Continue breast self-awareness checks.  Avoid chewing and smoking tobacco.  Avoid alcohol and drug use. Some  medicines that may be harmful to your baby can pass through breast milk. It is important to ask your health care provider before taking any medicine, including all over-the-counter and prescription medicine as well as vitamin and herbal supplements. It is possible to become pregnant while breastfeeding. If birth control is desired, ask your health care provider about options that will be safe for your baby. SEEK MEDICAL CARE IF:   You feel like you want to stop breastfeeding or have become frustrated with breastfeeding.  You have painful breasts or nipples.  Your nipples are cracked or bleeding.  Your breasts are red, tender, or warm.  You have a swollen area on either breast.  You have a fever or chills.  You have nausea or vomiting.  You have drainage other than breast milk from your nipples.  Your breasts do not become full before feedings by the fifth day after you give birth.  You feel sad and depressed.  Your baby is too sleepy to eat well.  Your baby is having trouble sleeping.   Your baby is wetting less than 3 diapers in a 24-hour period.  Your baby has less than 3 stools in a 24-hour period.  Your baby's skin or the white part of his or her eyes becomes yellow.   Your baby is not gaining weight by 5 days of age. SEEK IMMEDIATE MEDICAL CARE IF:   Your baby is overly tired (lethargic) and does not want to wake up and feed.  Your baby develops an unexplained fever. Document Released: 07/19/2005 Document Revised: 07/24/2013 Document Reviewed: 01/10/2013 [employer] Patient Information 2015 [employer]. This information is not intended to replace advice given to you by your health care provider. Make sure you discuss any questions you have with your health care provider.  

## 2014-02-05 NOTE — Progress Notes (Signed)
   Subjective:    Stacey MoselleFatima Carrillo is a [id number] being seen today for her first obstetrical visit.  Her obstetrical history is significant for short interval between pregnancies. Patient does intend to breast feed. Pregnancy history fully reviewed.  Patient reports no complaints.  Filed Vitals:   02/05/14 1024  BP: 110/68  Pulse: 79  Weight: 193 lb (87.544 kg)    HISTORY: OB History  Gravida Para Term Preterm AB SAB TAB Ectopic Multiple Living  8 4 4  0 3 1 2  0 0 4    # Outcome Date GA Lbr Len/2nd Weight Sex Delivery Anes PTL Lv  8 CUR           7 TRM 07/17/13 3433w3d 01:24 / 00:02 7 lb 1.8 oz (3.225 kg) F SVD None  Y  6 TRM 11/24/11 2375w5d / 00:01 8 lb 9.9 oz (3.909 kg) M SVD None  Y  5 TAB 04/2010 3345w0d       N  4 TAB 10/2009 7026w0d       N  3 TRM 02/21/08 864w0d  6 lb 7 oz (2.92 kg) F SVD EPI  Y  2 TRM 04/28/06 1153w0d  6 lb 2 oz (2.778 kg) M SVD Gen  Y  1 SAB              Comments: System Generated. Please review and update pregnancy details.     Past Medical History  Diagnosis Date  . Anemia   . Preterm labor   . Group B streptococcal infection   . HPV (human papilloma virus) infection   . Abnormal Pap smear   . Hepatitis B    Past Surgical History  Procedure Laterality Date  . Dilation and curettage of uterus     Family History  Problem Relation Age of Onset  . Hearing loss Brother   . Asthma Son      Exam     Skin: normal coloration and turgor, no rashes    Neurologic: normal   Extremities: normal strength, tone, and muscle mass   HEENT sclera clear, anicteric   Mouth/Teeth mucous membranes moist, pharynx normal without lesions and dental hygiene good   Neck supple   Cardiovascular: regular rate and rhythm, no murmurs or gallops   Respiratory:  appears well, vitals normal, no respiratory distress, acyanotic, normal RR, ear and throat exam is normal, neck free of mass or lymphadenopathy, chest clear, no wheezing, crepitations, rhonchi, normal symmetric air  entry   Abdomen: soft, non-tender; bowel sounds normal; no masses,  no organomegaly   Limited abd. U/S shows SIUP with RL 3.52 cm c/w 10 wk 2 days   Assessment:    Pregnancy: [name] Patient Active Problem List   Diagnosis Date Noted  . Supervision of other normal pregnancy 08/23/2011    Priority: High  . Maternal hepatitis B surface antigen positive, currently pregnant 02/09/2013    Priority: Medium  . Hepatitis B carrier 05/18/2011        Plan:     Initial labs drawn. Prenatal vitamins. Problem list reviewed and updated. Genetic Screening discussed First Screen: ordered.  Ultrasound discussed; fetal survey: discussed.  Follow up in 4 weeks. Considering BTL.  Stacey Carrillo S 02/05/2014

## 2014-02-06 ENCOUNTER — Encounter: Payer: Self-pay | Admitting: Family Medicine

## 2014-02-06 DIAGNOSIS — Z283 Underimmunization status: Secondary | ICD-10-CM | POA: Insufficient documentation

## 2014-02-06 DIAGNOSIS — O09899 Supervision of other high risk pregnancies, unspecified trimester: Secondary | ICD-10-CM | POA: Insufficient documentation

## 2014-02-06 DIAGNOSIS — O9989 Other specified diseases and conditions complicating pregnancy, childbirth and the puerperium: Secondary | ICD-10-CM

## 2014-02-06 LAB — OBSTETRIC PANEL
ANTIBODY SCREEN: NEGATIVE
BASOS PCT: 0 % (ref 0–1)
Basophils Absolute: 0 10*3/uL (ref 0.0–0.1)
Eosinophils Absolute: 0.1 10*3/uL (ref 0.0–0.7)
Eosinophils Relative: 1 % (ref 0–5)
HCT: 38.9 % (ref 36.0–46.0)
HEMOGLOBIN: 12.9 g/dL (ref 12.0–15.0)
HEP B S AG: POSITIVE — AB
LYMPHS PCT: 23 % (ref 12–46)
Lymphs Abs: 1.9 10*3/uL (ref 0.7–4.0)
MCH: 25.6 pg — ABNORMAL LOW (ref 26.0–34.0)
MCHC: 33.2 g/dL (ref 30.0–36.0)
MCV: 77.3 fL — ABNORMAL LOW (ref 78.0–100.0)
MONO ABS: 0.4 10*3/uL (ref 0.1–1.0)
MONOS PCT: 5 % (ref 3–12)
NEUTROS ABS: 6 10*3/uL (ref 1.7–7.7)
NEUTROS PCT: 71 % (ref 43–77)
Platelets: 199 10*3/uL (ref 150–400)
RBC: 5.03 MIL/uL (ref 3.87–5.11)
RDW: 14.8 % (ref 11.5–15.5)
RUBELLA: 0.87 {index} (ref <0.90)
Rh Type: POSITIVE
WBC: 8.4 10*3/uL (ref 4.0–10.5)

## 2014-02-06 LAB — GC/CHLAMYDIA PROBE AMP
CT Probe RNA: NEGATIVE
GC Probe RNA: NEGATIVE

## 2014-02-06 LAB — HEPATITIS B SURF AG CONFIRMATION: HEPATITIS B SURFACE ANTIGEN CONFIRMATION: POSITIVE — AB

## 2014-02-06 LAB — HIV ANTIBODY (ROUTINE TESTING W REFLEX): HIV: NONREACTIVE

## 2014-02-07 LAB — HEPATITIS B DNA, ULTRAQUANTITATIVE, PCR
HEPATITIS B DNA (CALC): 995 {copies}/mL — AB (ref <116)
Hepatitis B DNA: 171 IU/mL — ABNORMAL HIGH (ref <20)

## 2014-02-07 LAB — CULTURE, OB URINE
Colony Count: NO GROWTH
Organism ID, Bacteria: NO GROWTH

## 2014-03-06 ENCOUNTER — Ambulatory Visit (INDEPENDENT_AMBULATORY_CARE_PROVIDER_SITE_OTHER): Payer: Medicaid Other | Admitting: Obstetrics and Gynecology

## 2014-03-06 ENCOUNTER — Encounter: Payer: Self-pay | Admitting: Obstetrics and Gynecology

## 2014-03-06 VITALS — BP 114/75 | HR 87 | Wt 191.6 lb

## 2014-03-06 DIAGNOSIS — Z3480 Encounter for supervision of other normal pregnancy, unspecified trimester: Secondary | ICD-10-CM

## 2014-03-06 DIAGNOSIS — O98419 Viral hepatitis complicating pregnancy, unspecified trimester: Secondary | ICD-10-CM

## 2014-03-06 DIAGNOSIS — Z3481 Encounter for supervision of other normal pregnancy, first trimester: Secondary | ICD-10-CM

## 2014-03-06 DIAGNOSIS — Z2839 Other underimmunization status: Secondary | ICD-10-CM

## 2014-03-06 DIAGNOSIS — O09899 Supervision of other high risk pregnancies, unspecified trimester: Secondary | ICD-10-CM

## 2014-03-06 DIAGNOSIS — B181 Chronic viral hepatitis B without delta-agent: Secondary | ICD-10-CM

## 2014-03-06 DIAGNOSIS — Z283 Underimmunization status: Secondary | ICD-10-CM

## 2014-03-06 DIAGNOSIS — O9989 Other specified diseases and conditions complicating pregnancy, childbirth and the puerperium: Secondary | ICD-10-CM

## 2014-03-06 DIAGNOSIS — O26899 Other specified pregnancy related conditions, unspecified trimester: Secondary | ICD-10-CM

## 2014-03-06 MED ORDER — CONCEPT OB 130-92.4-1 MG PO CAPS: 1.0000 | ORAL_CAPSULE | Freq: Every day | ORAL | Status: DC

## 2014-03-06 NOTE — Progress Notes (Signed)
Patient is doing well without complaints. Patient did not go for first trimester screen but desires Quad screen at next visit. Will schedule anatomy ultrasound

## 2014-04-03 ENCOUNTER — Encounter: Payer: Self-pay | Admitting: Obstetrics and Gynecology

## 2014-04-03 ENCOUNTER — Ambulatory Visit (HOSPITAL_COMMUNITY)
Admission: RE | Admit: 2014-04-03 | Discharge: 2014-04-03 | Disposition: A | Discharge status: Home / Self Care | Payer: Medicaid Other | Source: Ambulatory Visit | Attending: Obstetrics and Gynecology | Admitting: Obstetrics and Gynecology

## 2014-04-03 ENCOUNTER — Ambulatory Visit (INDEPENDENT_AMBULATORY_CARE_PROVIDER_SITE_OTHER): Payer: Medicaid Other | Admitting: Obstetrics and Gynecology

## 2014-04-03 VITALS — BP 98/67 | HR 81 | Wt 188.0 lb

## 2014-04-03 DIAGNOSIS — Z1389 Encounter for screening for other disorder: Secondary | ICD-10-CM | POA: Diagnosis present

## 2014-04-03 DIAGNOSIS — Z3480 Encounter for supervision of other normal pregnancy, unspecified trimester: Secondary | ICD-10-CM

## 2014-04-03 DIAGNOSIS — O98419 Viral hepatitis complicating pregnancy, unspecified trimester: Secondary | ICD-10-CM

## 2014-04-03 DIAGNOSIS — Z363 Encounter for antenatal screening for malformations: Secondary | ICD-10-CM | POA: Insufficient documentation

## 2014-04-03 DIAGNOSIS — Z3481 Encounter for supervision of other normal pregnancy, first trimester: Secondary | ICD-10-CM

## 2014-04-03 DIAGNOSIS — Z3482 Encounter for supervision of other normal pregnancy, second trimester: Secondary | ICD-10-CM

## 2014-04-03 DIAGNOSIS — B181 Chronic viral hepatitis B without delta-agent: Secondary | ICD-10-CM

## 2014-04-03 DIAGNOSIS — O26899 Other specified pregnancy related conditions, unspecified trimester: Secondary | ICD-10-CM

## 2014-04-03 NOTE — Progress Notes (Signed)
Patient is doing well without complaints. Anatomy ultrasound scheduled for today

## 2014-04-19 ENCOUNTER — Encounter: Payer: Self-pay | Admitting: Obstetrics and Gynecology

## 2014-05-01 ENCOUNTER — Encounter: Payer: Self-pay | Admitting: Obstetrics & Gynecology

## 2014-05-01 ENCOUNTER — Ambulatory Visit (INDEPENDENT_AMBULATORY_CARE_PROVIDER_SITE_OTHER): Payer: Medicaid Other | Admitting: Obstetrics & Gynecology

## 2014-05-01 VITALS — BP 104/65 | HR 83 | Wt 186.0 lb

## 2014-05-01 DIAGNOSIS — Z23 Encounter for immunization: Secondary | ICD-10-CM | POA: Diagnosis not present

## 2014-05-01 DIAGNOSIS — Z3482 Encounter for supervision of other normal pregnancy, second trimester: Secondary | ICD-10-CM

## 2014-05-01 DIAGNOSIS — Z3480 Encounter for supervision of other normal pregnancy, unspecified trimester: Secondary | ICD-10-CM

## 2014-05-01 NOTE — Progress Notes (Signed)
Routine visit. Good FM. It's a girl, name unknown. No problems. Flu vaccine today. She will need a follow up u/s at 26-28 weeks.

## 2014-05-15 ENCOUNTER — Telehealth: Payer: Self-pay | Admitting: *Deleted

## 2014-05-15 DIAGNOSIS — Z3482 Encounter for supervision of other normal pregnancy, second trimester: Secondary | ICD-10-CM

## 2014-05-15 NOTE — Telephone Encounter (Signed)
Referral for follow up ultrasound is needed.

## 2014-05-17 ENCOUNTER — Ambulatory Visit (HOSPITAL_COMMUNITY): Payer: Medicaid Other

## 2014-05-20 ENCOUNTER — Ambulatory Visit (HOSPITAL_COMMUNITY): Payer: Medicaid Other

## 2014-05-21 ENCOUNTER — Ambulatory Visit (HOSPITAL_COMMUNITY)
Admission: RE | Admit: 2014-05-21 | Discharge: 2014-05-21 | Disposition: A | Discharge status: Home / Self Care | Payer: Medicaid Other | Source: Ambulatory Visit | Attending: Family Medicine | Admitting: Family Medicine

## 2014-05-21 DIAGNOSIS — Z3A26 26 weeks gestation of pregnancy: Secondary | ICD-10-CM

## 2014-05-21 DIAGNOSIS — IMO0002 Reserved for concepts with insufficient information to code with codable children: Secondary | ICD-10-CM

## 2014-05-21 DIAGNOSIS — Z36 Encounter for antenatal screening of mother: Secondary | ICD-10-CM | POA: Diagnosis not present

## 2014-05-21 DIAGNOSIS — Z3482 Encounter for supervision of other normal pregnancy, second trimester: Secondary | ICD-10-CM

## 2014-05-21 DIAGNOSIS — O09292 Supervision of pregnancy with other poor reproductive or obstetric history, second trimester: Secondary | ICD-10-CM | POA: Diagnosis present

## 2014-05-21 DIAGNOSIS — Z0489 Encounter for examination and observation for other specified reasons: Secondary | ICD-10-CM

## 2014-05-22 ENCOUNTER — Encounter: Payer: Self-pay | Admitting: Family Medicine

## 2014-05-29 ENCOUNTER — Encounter: Payer: Self-pay | Admitting: Family Medicine

## 2014-05-29 ENCOUNTER — Encounter: Payer: Medicaid Other | Admitting: Family Medicine

## 2014-05-29 ENCOUNTER — Ambulatory Visit (INDEPENDENT_AMBULATORY_CARE_PROVIDER_SITE_OTHER): Payer: Medicaid Other | Admitting: Family Medicine

## 2014-05-29 VITALS — BP 111/66 | HR 102 | Wt 185.0 lb

## 2014-05-29 DIAGNOSIS — Z3482 Encounter for supervision of other normal pregnancy, second trimester: Secondary | ICD-10-CM

## 2014-05-29 NOTE — Patient Instructions (Signed)
Second Trimester of Pregnancy The second trimester is from week 13 through week 28, months 4 through 6. The second trimester is often a time when you feel your best. Your body has also adjusted to being pregnant, and you begin to feel better physically. Usually, morning sickness has lessened or quit completely, you may have more energy, and you may have an increase in appetite. The second trimester is also a time when the fetus is growing rapidly. At the end of the sixth month, the fetus is about 9 inches long and weighs about 1 pounds. You will likely begin to feel the baby move (quickening) between 18 and 20 weeks of the pregnancy. BODY CHANGES Your body goes through many changes during pregnancy. The changes vary from woman to woman.   Your weight will continue to increase. You will notice your lower abdomen bulging out.  You may begin to get stretch marks on your hips, abdomen, and breasts.  You may develop headaches that can be relieved by medicines approved by your health care provider.  You may urinate more often because the fetus is pressing on your bladder.  You may develop or continue to have heartburn as a result of your pregnancy.  You may develop constipation because certain hormones are causing the muscles that push waste through your intestines to slow down.  You may develop hemorrhoids or swollen, bulging veins (varicose veins).  You may have back pain because of the weight gain and pregnancy hormones relaxing your joints between the bones in your pelvis and as a result of a shift in weight and the muscles that support your balance.  Your breasts will continue to grow and be tender.  Your gums may bleed and may be sensitive to brushing and flossing.  Dark spots or blotches (chloasma, mask of pregnancy) may develop on your face. This will likely fade after the baby is born.  A dark line from your belly button to the pubic area (linea nigra) may appear. This will likely  fade after the baby is born.  You may have changes in your hair. These can include thickening of your hair, rapid growth, and changes in texture. Some women also have hair loss during or after pregnancy, or hair that feels dry or thin. Your hair will most likely return to normal after your baby is born. WHAT TO EXPECT AT YOUR PRENATAL VISITS During a routine prenatal visit:  You will be weighed to make sure you and the fetus are growing normally.  Your blood pressure will be taken.  Your abdomen will be measured to track your baby's growth.  The fetal heartbeat will be listened to.  Any test results from the previous visit will be discussed. Your health care provider may ask you:  How you are feeling.  If you are feeling the baby move.  If you have had any abnormal symptoms, such as leaking fluid, bleeding, severe headaches, or abdominal cramping.  If you have any questions. Other tests that may be performed during your second trimester include:  Blood tests that check for:  Low iron levels (anemia).  Gestational diabetes (between 24 and 28 weeks).  Rh antibodies.  Urine tests to check for infections, diabetes, or protein in the urine.  An ultrasound to confirm the proper growth and development of the baby.  An amniocentesis to check for possible genetic problems.  Fetal screens for spina bifida and Down syndrome. HOME CARE INSTRUCTIONS   Avoid all smoking, herbs, alcohol, and unprescribed   drugs. These chemicals affect the formation and growth of the baby.  Follow your health care provider's instructions regarding medicine use. There are medicines that are either safe or unsafe to take during pregnancy.  Exercise only as directed by your health care provider. Experiencing uterine cramps is a good sign to stop exercising.  Continue to eat regular, healthy meals.  Wear a good support bra for breast tenderness.  Do not use hot tubs, steam rooms, or saunas.  Wear  your seat belt at all times when driving.  Avoid raw meat, uncooked cheese, cat litter boxes, and soil used by cats. These carry germs that can cause birth defects in the baby.  Take your prenatal vitamins.  Try taking a stool softener (if your health care provider approves) if you develop constipation. Eat more high-fiber foods, such as fresh vegetables or fruit and whole grains. Drink plenty of fluids to keep your urine clear or pale yellow.  Take warm sitz baths to soothe any pain or discomfort caused by hemorrhoids. Use hemorrhoid cream if your health care provider approves.  If you develop varicose veins, wear support hose. Elevate your feet for 15 minutes, 3-4 times a day. Limit salt in your diet.  Avoid heavy lifting, wear low heel shoes, and practice good posture.  Rest with your legs elevated if you have leg cramps or low back pain.  Visit your dentist if you have not gone yet during your pregnancy. Use a soft toothbrush to brush your teeth and be gentle when you floss.  A sexual relationship may be continued unless your health care provider directs you otherwise.  Continue to go to all your prenatal visits as directed by your health care provider. SEEK MEDICAL CARE IF:   You have dizziness.  You have mild pelvic cramps, pelvic pressure, or nagging pain in the abdominal area.  You have persistent nausea, vomiting, or diarrhea.  You have a bad smelling vaginal discharge.  You have pain with urination. SEEK IMMEDIATE MEDICAL CARE IF:   You have a fever.  You are leaking fluid from your vagina.  You have spotting or bleeding from your vagina.  You have severe abdominal cramping or pain.  You have rapid weight gain or loss.  You have shortness of breath with chest pain.  You notice sudden or extreme swelling of your face, hands, ankles, feet, or legs.  You have not felt your baby move in over an hour.  You have severe headaches that do not go away with  medicine.  You have vision changes. Document Released: 07/13/2001 Document Revised: 07/24/2013 Document Reviewed: 09/19/2012 [employer] Patient Information 2015 [employer]. This information is not intended to replace advice given to you by your health care provider. Make sure you discuss any questions you have with your health care provider.  Breastfeeding Deciding to breastfeed is one of the best choices you can make for you and your baby. A change in hormones during pregnancy causes your breast tissue to grow and increases the number and size of your milk ducts. These hormones also allow proteins, sugars, and fats from your blood supply to make breast milk in your milk-producing glands. Hormones prevent breast milk from being released before your baby is born as well as prompt milk flow after birth. Once breastfeeding has begun, thoughts of your baby, as well as his or her sucking or crying, can stimulate the release of milk from your milk-producing glands.  BENEFITS OF BREASTFEEDING For Your Baby  Your first   milk (colostrum) helps your baby's digestive system function better.   There are antibodies in your milk that help your baby fight off infections.   Your baby has a lower incidence of asthma, allergies, and sudden infant death syndrome.   The nutrients in breast milk are better for your baby than infant formulas and are designed uniquely for your baby's needs.   Breast milk improves your baby's brain development.   Your baby is less likely to develop other conditions, such as childhood obesity, asthma, or type 2 diabetes mellitus.  For You   Breastfeeding helps to create a very special bond between you and your baby.   Breastfeeding is convenient. Breast milk is always available at the correct temperature and costs nothing.   Breastfeeding helps to burn calories and helps you lose the weight gained during pregnancy.   Breastfeeding makes your uterus contract to its  prepregnancy size faster and slows bleeding (lochia) after you give birth.   Breastfeeding helps to lower your risk of developing type 2 diabetes mellitus, osteoporosis, and breast or ovarian cancer later in life. SIGNS THAT YOUR BABY IS HUNGRY Early Signs of Hunger  Increased alertness or activity.  Stretching.  Movement of the head from side to side.  Movement of the head and opening of the mouth when the corner of the mouth or cheek is stroked (rooting).  Increased sucking sounds, smacking lips, cooing, sighing, or squeaking.  Hand-to-mouth movements.  Increased sucking of fingers or hands. Late Signs of Hunger  Fussing.  Intermittent crying. Extreme Signs of Hunger Signs of extreme hunger will require calming and consoling before your baby will be able to breastfeed successfully. Do not wait for the following signs of extreme hunger to occur before you initiate breastfeeding:   Restlessness.  A loud, strong cry.   Screaming. BREASTFEEDING BASICS Breastfeeding Initiation  Find a comfortable place to sit or lie down, with your neck and back well supported.  Place a pillow or rolled up blanket under your baby to bring him or her to the level of your breast (if you are seated). Nursing pillows are specially designed to help support your arms and your baby while you breastfeed.  Make sure that your baby's abdomen is facing your abdomen.   Gently massage your breast. With your fingertips, massage from your chest wall toward your nipple in a circular motion. This encourages milk flow. You may need to continue this action during the feeding if your milk flows slowly.  Support your breast with 4 fingers underneath and your thumb above your nipple. Make sure your fingers are well away from your nipple and your baby's mouth.   Stroke your baby's lips gently with your finger or nipple.   When your baby's mouth is open wide enough, quickly bring your baby to your breast,  placing your entire nipple and as much of the colored area around your nipple (areola) as possible into your baby's mouth.   More areola should be visible above your baby's upper lip than below the lower lip.   Your baby's tongue should be between his or her lower gum and your breast.   Ensure that your baby's mouth is correctly positioned around your nipple (latched). Your baby's lips should create a seal on your breast and be turned out (everted).  It is common for your baby to suck about 2-3 minutes in order to start the flow of breast milk. Latching Teaching your baby how to latch on to your breast   properly is very important. An improper latch can cause nipple pain and decreased milk supply for you and poor weight gain in your baby. Also, if your baby is not latched onto your nipple properly, he or she may swallow some air during feeding. This can make your baby fussy. Burping your baby when you switch breasts during the feeding can help to get rid of the air. However, teaching your baby to latch on properly is still the best way to prevent fussiness from swallowing air while breastfeeding. Signs that your baby has successfully latched on to your nipple:    Silent tugging or silent sucking, without causing you pain.   Swallowing heard between every 3-4 sucks.    Muscle movement above and in front of his or her ears while sucking.  Signs that your baby has not successfully latched on to nipple:   Sucking sounds or smacking sounds from your baby while breastfeeding.  Nipple pain. If you think your baby has not latched on correctly, slip your finger into the corner of your baby's mouth to break the suction and place it between your baby's gums. Attempt breastfeeding initiation again. Signs of Successful Breastfeeding Signs from your baby:   A gradual decrease in the number of sucks or complete cessation of sucking.   Falling asleep.   Relaxation of his or her body.    Retention of a small amount of milk in his or her mouth.   Letting go of your breast by himself or herself. Signs from you:  Breasts that have increased in firmness, weight, and size 1-3 hours after feeding.   Breasts that are softer immediately after breastfeeding.  Increased milk volume, as well as a change in milk consistency and color by the fifth day of breastfeeding.   Nipples that are not sore, cracked, or bleeding. Signs That Your Baby is Getting Enough Milk  Wetting at least 3 diapers in a 24-hour period. The urine should be clear and pale yellow by age 5 days.  At least 3 stools in a 24-hour period by age 5 days. The stool should be soft and yellow.  At least 3 stools in a 24-hour period by age 7 days. The stool should be seedy and yellow.  No loss of weight greater than 10% of birth weight during the first 3 days of age.  Average weight gain of 4-7 ounces (113-198 g) per week after age 4 days.  Consistent daily weight gain by age 5 days, without weight loss after the age of 2 weeks. After a feeding, your baby may spit up a small amount. This is common. BREASTFEEDING FREQUENCY AND DURATION Frequent feeding will help you make more milk and can prevent sore nipples and breast engorgement. Breastfeed when you feel the need to reduce the fullness of your breasts or when your baby shows signs of hunger. This is called "breastfeeding on demand." Avoid introducing a pacifier to your baby while you are working to establish breastfeeding (the first 4-6 weeks after your baby is born). After this time you may choose to use a pacifier. Research has shown that pacifier use during the first year of a baby's life decreases the risk of sudden infant death syndrome (SIDS). Allow your baby to feed on each breast as long as he or she wants. Breastfeed until your baby is finished feeding. When your baby unlatches or falls asleep while feeding from the first breast, offer the second breast.  Because newborns are often sleepy in the   first few weeks of life, you may need to awaken your baby to get him or her to feed. Breastfeeding times will vary from baby to baby. However, the following rules can serve as a guide to help you ensure that your baby is properly fed:  Newborns (babies 4 weeks of age or younger) may breastfeed every 1-3 hours.  Newborns should not go longer than 3 hours during the day or 5 hours during the night without breastfeeding.  You should breastfeed your baby a minimum of 8 times in a 24-hour period until you begin to introduce solid foods to your baby at around 6 months of age. BREAST MILK PUMPING Pumping and storing breast milk allows you to ensure that your baby is exclusively fed your breast milk, even at times when you are unable to breastfeed. This is especially important if you are going back to work while you are still breastfeeding or when you are not able to be present during feedings. Your lactation consultant can give you guidelines on how long it is safe to store breast milk.  A breast pump is a machine that allows you to pump milk from your breast into a sterile bottle. The pumped breast milk can then be stored in a refrigerator or freezer. Some breast pumps are operated by hand, while others use electricity. Ask your lactation consultant which type will work best for you. Breast pumps can be purchased, but some hospitals and breastfeeding support groups lease breast pumps on a monthly basis. A lactation consultant can teach you how to hand express breast milk, if you prefer not to use a pump.  CARING FOR YOUR BREASTS WHILE YOU BREASTFEED Nipples can become dry, cracked, and sore while breastfeeding. The following recommendations can help keep your breasts moisturized and healthy:  Avoid using soap on your nipples.   Wear a supportive bra. Although not required, special nursing bras and tank tops are designed to allow access to your breasts for  breastfeeding without taking off your entire bra or top. Avoid wearing underwire-style bras or extremely tight bras.  Air dry your nipples for 3-4minutes after each feeding.   Use only cotton bra pads to absorb leaked breast milk. Leaking of breast milk between feedings is normal.   Use lanolin on your nipples after breastfeeding. Lanolin helps to maintain your skin's normal moisture barrier. If you use pure lanolin, you do not need to wash it off before feeding your baby again. Pure lanolin is not toxic to your baby. You may also hand express a few drops of breast milk and gently massage that milk into your nipples and allow the milk to air dry. In the first few weeks after giving birth, some women experience extremely full breasts (engorgement). Engorgement can make your breasts feel heavy, warm, and tender to the touch. Engorgement peaks within 3-5 days after you give birth. The following recommendations can help ease engorgement:  Completely empty your breasts while breastfeeding or pumping. You may want to start by applying warm, moist heat (in the shower or with warm water-soaked hand towels) just before feeding or pumping. This increases circulation and helps the milk flow. If your baby does not completely empty your breasts while breastfeeding, pump any extra milk after he or she is finished.  Wear a snug bra (nursing or regular) or tank top for 1-2 days to signal your body to slightly decrease milk production.  Apply ice packs to your breasts, unless this is too uncomfortable for you.    Make sure that your baby is latched on and positioned properly while breastfeeding. If engorgement persists after 48 hours of following these recommendations, contact your health care provider or a lactation consultant. OVERALL HEALTH CARE RECOMMENDATIONS WHILE BREASTFEEDING  Eat healthy foods. Alternate between meals and snacks, eating 3 of each per day. Because what you eat affects your breast milk,  some of the foods may make your baby more irritable than usual. Avoid eating these foods if you are sure that they are negatively affecting your baby.  Drink milk, fruit juice, and water to satisfy your thirst (about 10 glasses a day).   Rest often, relax, and continue to take your prenatal vitamins to prevent fatigue, stress, and anemia.  Continue breast self-awareness checks.  Avoid chewing and smoking tobacco.  Avoid alcohol and drug use. Some medicines that may be harmful to your baby can pass through breast milk. It is important to ask your health care provider before taking any medicine, including all over-the-counter and prescription medicine as well as vitamin and herbal supplements. It is possible to become pregnant while breastfeeding. If birth control is desired, ask your health care provider about options that will be safe for your baby. SEEK MEDICAL CARE IF:   You feel like you want to stop breastfeeding or have become frustrated with breastfeeding.  You have painful breasts or nipples.  Your nipples are cracked or bleeding.  Your breasts are red, tender, or warm.  You have a swollen area on either breast.  You have a fever or chills.  You have nausea or vomiting.  You have drainage other than breast milk from your nipples.  Your breasts do not become full before feedings by the fifth day after you give birth.  You feel sad and depressed.  Your baby is too sleepy to eat well.  Your baby is having trouble sleeping.   Your baby is wetting less than 3 diapers in a 24-hour period.  Your baby has less than 3 stools in a 24-hour period.  Your baby's skin or the white part of his or her eyes becomes yellow.   Your baby is not gaining weight by 5 days of age. SEEK IMMEDIATE MEDICAL CARE IF:   Your baby is overly tired (lethargic) and does not want to wake up and feed.  Your baby develops an unexplained fever. Document Released: 07/19/2005 Document Revised:  07/24/2013 Document Reviewed: 01/10/2013 [employer] Patient Information 2015 [employer]. This information is not intended to replace advice given to you by your health care provider. Make sure you discuss any questions you have with your health care provider.  

## 2014-05-29 NOTE — Progress Notes (Signed)
Doing well--Sugar testing next week and TDaP. U/S for completion of anatomy was nml and baby was 48%.

## 2014-06-03 ENCOUNTER — Encounter: Payer: Self-pay | Admitting: Family Medicine

## 2014-06-05 ENCOUNTER — Other Ambulatory Visit (INDEPENDENT_AMBULATORY_CARE_PROVIDER_SITE_OTHER): Payer: Medicaid Other | Admitting: *Deleted

## 2014-06-05 ENCOUNTER — Encounter: Payer: Self-pay | Admitting: *Deleted

## 2014-06-05 DIAGNOSIS — Z3482 Encounter for supervision of other normal pregnancy, second trimester: Secondary | ICD-10-CM

## 2014-06-05 DIAGNOSIS — Z01419 Encounter for gynecological examination (general) (routine) without abnormal findings: Secondary | ICD-10-CM

## 2014-06-05 LAB — CBC
HCT: 35.8 % — ABNORMAL LOW (ref 36.0–46.0)
Hemoglobin: 11.8 g/dL — ABNORMAL LOW (ref 12.0–15.0)
MCH: 25.4 pg — ABNORMAL LOW (ref 26.0–34.0)
MCHC: 33 g/dL (ref 30.0–36.0)
MCV: 77 fL — AB (ref 78.0–100.0)
PLATELETS: 150 10*3/uL (ref 150–400)
RBC: 4.65 MIL/uL (ref 3.87–5.11)
RDW: 15.1 % (ref 11.5–15.5)
WBC: 7.7 10*3/uL (ref 4.0–10.5)

## 2014-06-05 NOTE — Progress Notes (Signed)
Pt here today for her 1 hr GTT and 28 week labs

## 2014-06-06 ENCOUNTER — Encounter: Payer: Self-pay | Admitting: Obstetrics & Gynecology

## 2014-06-06 DIAGNOSIS — O99814 Abnormal glucose complicating childbirth: Secondary | ICD-10-CM | POA: Insufficient documentation

## 2014-06-06 LAB — RPR

## 2014-06-06 LAB — HIV ANTIBODY (ROUTINE TESTING W REFLEX): HIV: NONREACTIVE

## 2014-06-06 LAB — GLUCOSE TOLERANCE, 1 HOUR (50G) W/O FASTING: GLUCOSE 1 HOUR GTT: 144 mg/dL — AB (ref 70–140)

## 2014-06-10 ENCOUNTER — Telehealth: Payer: Self-pay | Admitting: *Deleted

## 2014-06-10 NOTE — Telephone Encounter (Signed)
-----   Message from Allie BossierMyra C Dove, MD sent at 06/06/2014  1:04 PM EST ----- She will need a 3 hour GTT. Thanks

## 2014-06-10 NOTE — Telephone Encounter (Signed)
Left message for patient to call us back regarding her abnormal glucose test.

## 2014-06-12 ENCOUNTER — Ambulatory Visit (INDEPENDENT_AMBULATORY_CARE_PROVIDER_SITE_OTHER): Payer: Medicaid Other | Admitting: Obstetrics & Gynecology

## 2014-06-12 ENCOUNTER — Encounter: Payer: Self-pay | Admitting: Obstetrics & Gynecology

## 2014-06-12 VITALS — BP 108/64 | HR 94 | Wt 185.6 lb

## 2014-06-12 DIAGNOSIS — O98419 Viral hepatitis complicating pregnancy, unspecified trimester: Secondary | ICD-10-CM

## 2014-06-12 DIAGNOSIS — Z3A28 28 weeks gestation of pregnancy: Secondary | ICD-10-CM

## 2014-06-12 DIAGNOSIS — O9989 Other specified diseases and conditions complicating pregnancy, childbirth and the puerperium: Secondary | ICD-10-CM

## 2014-06-12 DIAGNOSIS — O99814 Abnormal glucose complicating childbirth: Secondary | ICD-10-CM

## 2014-06-12 DIAGNOSIS — Z23 Encounter for immunization: Secondary | ICD-10-CM

## 2014-06-12 DIAGNOSIS — B181 Chronic viral hepatitis B without delta-agent: Secondary | ICD-10-CM

## 2014-06-12 DIAGNOSIS — Z3483 Encounter for supervision of other normal pregnancy, third trimester: Secondary | ICD-10-CM

## 2014-06-12 DIAGNOSIS — O280 Abnormal hematological finding on antenatal screening of mother: Secondary | ICD-10-CM

## 2014-06-12 DIAGNOSIS — Z2250 Carrier of unspecified viral hepatitis: Secondary | ICD-10-CM

## 2014-06-12 NOTE — Progress Notes (Signed)
3 hr GTT and Tdap today. No other complaints or concerns.  Labor and fetal movement precautions reviewed.

## 2014-06-12 NOTE — Patient Instructions (Signed)
Return to clinic for any obstetric concerns or go to MAU for evaluation  

## 2014-06-13 LAB — GLUCOSE TOLERANCE, 3 HOURS
GLUCOSE, FASTING-GESTATIONAL: 67 mg/dL — AB (ref 70–104)
Glucose Tolerance, 1 hour: 134 mg/dL (ref 70–189)
Glucose Tolerance, 2 hour: 112 mg/dL (ref 70–164)
Glucose, GTT - 3 Hour: 111 mg/dL (ref 70–144)

## 2014-07-03 ENCOUNTER — Encounter: Payer: Self-pay | Admitting: Obstetrics & Gynecology

## 2014-07-03 ENCOUNTER — Ambulatory Visit (INDEPENDENT_AMBULATORY_CARE_PROVIDER_SITE_OTHER): Payer: Medicaid Other | Admitting: Obstetrics & Gynecology

## 2014-07-03 VITALS — BP 118/70 | HR 93 | Wt 184.0 lb

## 2014-07-03 DIAGNOSIS — Z3483 Encounter for supervision of other normal pregnancy, third trimester: Secondary | ICD-10-CM

## 2014-07-03 DIAGNOSIS — O98419 Viral hepatitis complicating pregnancy, unspecified trimester: Secondary | ICD-10-CM

## 2014-07-03 DIAGNOSIS — Z2250 Carrier of unspecified viral hepatitis: Secondary | ICD-10-CM

## 2014-07-03 DIAGNOSIS — Z2839 Other underimmunization status: Secondary | ICD-10-CM

## 2014-07-03 DIAGNOSIS — B181 Chronic viral hepatitis B without delta-agent: Secondary | ICD-10-CM

## 2014-07-03 DIAGNOSIS — O9989 Other specified diseases and conditions complicating pregnancy, childbirth and the puerperium: Secondary | ICD-10-CM

## 2014-07-03 DIAGNOSIS — Z283 Underimmunization status: Secondary | ICD-10-CM

## 2014-07-03 NOTE — Progress Notes (Signed)
Normal 3 hr GTT.  Tubal papers signed today.  No other complaints or concerns.  Labor and fetal movement precautions reviewed.

## 2014-07-03 NOTE — Patient Instructions (Signed)
Return to clinic for any obstetric concerns or go to MAU for evaluation  

## 2014-07-17 ENCOUNTER — Ambulatory Visit (INDEPENDENT_AMBULATORY_CARE_PROVIDER_SITE_OTHER): Payer: Medicaid Other | Admitting: Obstetrics and Gynecology

## 2014-07-17 ENCOUNTER — Encounter: Payer: Self-pay | Admitting: Obstetrics and Gynecology

## 2014-07-17 VITALS — BP 100/64 | HR 68 | Wt 186.0 lb

## 2014-07-17 DIAGNOSIS — Z2839 Other underimmunization status: Secondary | ICD-10-CM

## 2014-07-17 DIAGNOSIS — O9989 Other specified diseases and conditions complicating pregnancy, childbirth and the puerperium: Secondary | ICD-10-CM

## 2014-07-17 DIAGNOSIS — B181 Chronic viral hepatitis B without delta-agent: Secondary | ICD-10-CM

## 2014-07-17 DIAGNOSIS — Z283 Underimmunization status: Secondary | ICD-10-CM

## 2014-07-17 DIAGNOSIS — Z2250 Carrier of unspecified viral hepatitis: Secondary | ICD-10-CM

## 2014-07-17 DIAGNOSIS — Z2251 Carrier of viral hepatitis B: Secondary | ICD-10-CM

## 2014-07-17 DIAGNOSIS — Z3483 Encounter for supervision of other normal pregnancy, third trimester: Secondary | ICD-10-CM

## 2014-07-17 DIAGNOSIS — O98419 Viral hepatitis complicating pregnancy, unspecified trimester: Secondary | ICD-10-CM

## 2014-07-17 NOTE — Progress Notes (Signed)
Patient is doing well without complaints. FM/PTL precautions reviewed. Husband is planning on getting a vasectomy

## 2014-07-30 ENCOUNTER — Encounter: Payer: Self-pay | Admitting: Obstetrics & Gynecology

## 2014-07-30 ENCOUNTER — Ambulatory Visit (INDEPENDENT_AMBULATORY_CARE_PROVIDER_SITE_OTHER): Payer: Medicaid Other | Admitting: Obstetrics & Gynecology

## 2014-07-30 VITALS — BP 108/70 | HR 70 | Wt 185.0 lb

## 2014-07-30 DIAGNOSIS — Z36 Encounter for antenatal screening of mother: Secondary | ICD-10-CM

## 2014-07-30 DIAGNOSIS — Z3483 Encounter for supervision of other normal pregnancy, third trimester: Secondary | ICD-10-CM

## 2014-07-30 LAB — OB RESULTS CONSOLE GC/CHLAMYDIA
CHLAMYDIA, DNA PROBE: NEGATIVE
GC PROBE AMP, GENITAL: NEGATIVE

## 2014-07-30 LAB — OB RESULTS CONSOLE GBS: STREP GROUP B AG: NEGATIVE

## 2014-07-30 NOTE — Progress Notes (Signed)
Routine visit. Good FM. Irregular contractions. She denies ROM or vaginal bleeding. She reports a h/o rapid labor with her last baby. Cervical cultures today.

## 2014-07-31 LAB — GC/CHLAMYDIA PROBE AMP
CT PROBE, AMP APTIMA: NEGATIVE
GC PROBE AMP APTIMA: NEGATIVE

## 2014-08-01 LAB — CULTURE, BETA STREP (GROUP B ONLY)

## 2014-08-02 NOTE — L&D Delivery Note (Signed)
Delivery Note At 11:11 PM a viable female was delivered via Vaginal, Spontaneous Delivery (Presentation: ; Occiput Anterior).  APGAR: 8, 9; weight 7 lb 4.2 oz (3295 g).   Placenta status: Intact, Spontaneous.  Cord: 3 vessels with the following complications: None.    Patient progressed well. Little pushing once baby's head passed pubic symphysis. No complications at delivery.   Anesthesia: None  Episiotomy: None Lacerations: None Suture Repair: n/a Est. Blood Loss (mL): 300  Mom to postpartum.  Baby to Couplet care / Skin to Skin.  Kathee DeltonMcKeag, Ian D 08/17/2014, 12:52 AM  I have seen and examined this patient and I agree with the above. Given cytotec 800mcg shortly after delivery due to oozing. Will continue with Metherg[facility] series on MBU. Cam HaiSHAW, KIMBERLY CNM 1:02 AM 08/17/2014

## 2014-08-05 ENCOUNTER — Encounter (HOSPITAL_COMMUNITY): Payer: Self-pay | Admitting: General Practice

## 2014-08-05 ENCOUNTER — Inpatient Hospital Stay (HOSPITAL_COMMUNITY)
Admission: AD | Admit: 2014-08-05 | Discharge: 2014-08-05 | Disposition: A | Discharge status: Home / Self Care | Payer: Medicaid Other | Source: Ambulatory Visit | Attending: Family Medicine | Admitting: Family Medicine

## 2014-08-05 DIAGNOSIS — Z3A36 36 weeks gestation of pregnancy: Secondary | ICD-10-CM | POA: Diagnosis not present

## 2014-08-05 NOTE — Discharge Instructions (Signed)
Braxton Hicks Contractions °Contractions of the uterus can occur throughout pregnancy. Contractions are not always a sign that you are in labor.  °WHAT ARE BRAXTON HICKS CONTRACTIONS?  °Contractions that occur before labor are called Braxton Hicks contractions, or false labor. Toward the end of pregnancy (32-34 weeks), these contractions can develop more often and may become more forceful. This is not true labor because these contractions do not result in opening (dilatation) and thinning of the cervix. They are sometimes difficult to tell apart from true labor because these contractions can be forceful and people have different pain tolerances. You should not feel embarrassed if you go to the hospital with false labor. Sometimes, the only way to tell if you are in true labor is for your health care provider to look for changes in the cervix. °If there are no prenatal problems or other health problems associated with the pregnancy, it is completely safe to be sent home with false labor and await the onset of true labor. °HOW CAN YOU TELL THE DIFFERENCE BETWEEN TRUE AND FALSE LABOR? °False Labor °· The contractions of false labor are usually shorter and not as hard as those of true labor.   °· The contractions are usually irregular.   °· The contractions are often felt in the front of the lower abdomen and in the groin.   °· The contractions may go away when you walk around or change positions while lying down.   °· The contractions get weaker and are shorter lasting as time goes on.   °· The contractions do not usually become progressively stronger, regular, and closer together as with true labor.   °True Labor °· Contractions in true labor last 30-70 seconds, become very regular, usually become more intense, and increase in frequency.   °· The contractions do not go away with walking.   °· The discomfort is usually felt in the top of the uterus and spreads to the lower abdomen and low back.   °· True labor can be  determined by your health care provider with an exam. This will show that the cervix is dilating and getting thinner.   °WHAT TO REMEMBER °· Keep up with your usual exercises and follow other instructions given by your health care provider.   °· Take medicines as directed by your health care provider.   °· Keep your regular prenatal appointments.   °· Eat and drink lightly if you think you are going into labor.   °· If Braxton Hicks contractions are making you uncomfortable:   °¨ Change your position from lying down or resting to walking, or from walking to resting.   °¨ Sit and rest in a tub of warm water.   °¨ Drink 2-3 glasses of water. Dehydration may cause these contractions.   °¨ Do slow and deep breathing several times an hour.   °WHEN SHOULD I SEEK IMMEDIATE MEDICAL CARE? °Seek immediate medical care if: °· Your contractions become stronger, more regular, and closer together.   °· You have fluid leaking or gushing from your vagina.   °· You have a fever.   °· You pass blood-tinged mucus.   °· You have vaginal bleeding.   °· You have continuous abdominal pain.   °· You have low back pain that you never had before.   °· You feel your baby's head pushing down and causing pelvic pressure.   °· Your baby is not moving as much as it used to.   °Document Released: 07/19/2005 Document Revised: 07/24/2013 Document Reviewed: 04/30/2013 °[employer]® Patient Information ©2015 [employer]. This information is not intended to replace advice given to you by your health care   provider. Make sure you discuss any questions you have with your health care provider. ° °

## 2014-08-05 NOTE — MAU Note (Signed)
Contractions started on Sat in back, have continued.  Has 3 to 4 back contractions a day, regular ones in front are every 30-74min. Spotted on Sat, just a tinge one time when wiped.

## 2014-08-05 NOTE — MAU Note (Signed)
Was4/90 when last checked

## 2014-08-07 ENCOUNTER — Encounter: Payer: Self-pay | Admitting: Advanced Practice Midwife

## 2014-08-07 ENCOUNTER — Ambulatory Visit (INDEPENDENT_AMBULATORY_CARE_PROVIDER_SITE_OTHER): Payer: Medicaid Other | Admitting: Advanced Practice Midwife

## 2014-08-07 ENCOUNTER — Encounter: Payer: Medicaid Other | Admitting: Obstetrics and Gynecology

## 2014-08-07 VITALS — BP 122/74 | HR 88 | Wt 187.8 lb

## 2014-08-07 DIAGNOSIS — Z3483 Encounter for supervision of other normal pregnancy, third trimester: Secondary | ICD-10-CM

## 2014-08-07 NOTE — Progress Notes (Signed)
Doing well.  Good fetal movement, denies vaginal bleeding, LOF.  Does report daily contractions, becoming more painful and regular when standing but often reduce or resolve at rest.  She had a fast labor with her previous pregnancy and is concerned about this. Cervix essentially unchanged from last visit.  Reviewed signs of labor, reasons to go to hospital.  Reassurance provided that it is better to go to MAU for evaluation if she is concerned, even if she has to get sent home.

## 2014-08-13 ENCOUNTER — Ambulatory Visit (INDEPENDENT_AMBULATORY_CARE_PROVIDER_SITE_OTHER): Payer: Medicaid Other | Admitting: Family Medicine

## 2014-08-13 ENCOUNTER — Encounter: Payer: Self-pay | Admitting: Family Medicine

## 2014-08-13 ENCOUNTER — Encounter: Payer: Self-pay | Admitting: *Deleted

## 2014-08-13 VITALS — BP 113/73 | HR 76 | Wt 191.0 lb

## 2014-08-13 DIAGNOSIS — Z3483 Encounter for supervision of other normal pregnancy, third trimester: Secondary | ICD-10-CM

## 2014-08-13 NOTE — Patient Instructions (Signed)
Third Trimester of Pregnancy The third trimester is from week 29 through week 42, months 7 through 9. The third trimester is a time when the fetus is growing rapidly. At the end of the ninth month, the fetus is about 20 inches in length and weighs 6-10 pounds.  BODY CHANGES Your body goes through many changes during pregnancy. The changes vary from woman to woman.   Your weight will continue to increase. You can expect to gain 25-35 pounds (11-16 kg) by the end of the pregnancy.  You may begin to get stretch marks on your hips, abdomen, and breasts.  You may urinate more often because the fetus is moving lower into your pelvis and pressing on your bladder.  You may develop or continue to have heartburn as a result of your pregnancy.  You may develop constipation because certain hormones are causing the muscles that push waste through your intestines to slow down.  You may develop hemorrhoids or swollen, bulging veins (varicose veins).  You may have pelvic pain because of the weight gain and pregnancy hormones relaxing your joints between the bones in your pelvis. Backaches may result from overexertion of the muscles supporting your posture.  You may have changes in your hair. These can include thickening of your hair, rapid growth, and changes in texture. Some women also have hair loss during or after pregnancy, or hair that feels dry or thin. Your hair will most likely return to normal after your baby is born.  Your breasts will continue to grow and be tender. A yellow discharge may leak from your breasts called colostrum.  Your belly button may stick out.  You may feel short of breath because of your expanding uterus.  You may notice the fetus "dropping," or moving lower in your abdomen.  You may have a bloody mucus discharge. This usually occurs a few days to a week before labor begins.  Your cervix becomes thin and soft (effaced) near your due date. WHAT TO EXPECT AT YOUR  PRENATAL EXAMS  You will have prenatal exams every 2 weeks until week 36. Then, you will have weekly prenatal exams. During a routine prenatal visit:  You will be weighed to make sure you and the fetus are growing normally.  Your blood pressure is taken.  Your abdomen will be measured to track your baby's growth.  The fetal heartbeat will be listened to.  Any test results from the previous visit will be discussed.  You may have a cervical check near your due date to see if you have effaced. At around 36 weeks, your caregiver will check your cervix. At the same time, your caregiver will also perform a test on the secretions of the vaginal tissue. This test is to determine if a type of bacteria, Group B streptococcus, is present. Your caregiver will explain this further. Your caregiver may ask you:  What your birth plan is.  How you are feeling.  If you are feeling the baby move.  If you have had any abnormal symptoms, such as leaking fluid, bleeding, severe headaches, or abdominal cramping.  If you have any questions. Other tests or screenings that may be performed during your third trimester include:  Blood tests that check for low iron levels (anemia).  Fetal testing to check the health, activity level, and growth of the fetus. Testing is done if you have certain medical conditions or if there are problems during the pregnancy. FALSE LABOR You may feel small, irregular contractions that   eventually go away. These are called Braxton Hicks contractions, or false labor. Contractions may last for hours, days, or even weeks before true labor sets in. If contractions come at regular intervals, intensify, or become painful, it is best to be seen by your caregiver.  SIGNS OF LABOR   Menstrual-like cramps.  Contractions that are 5 minutes apart or less.  Contractions that start on the top of the uterus and spread down to the lower abdomen and back.  A sense of increased pelvic  pressure or back pain.  A watery or bloody mucus discharge that comes from the vagina. If you have any of these signs before the 37th week of pregnancy, call your caregiver right away. You need to go to the hospital to get checked immediately. HOME CARE INSTRUCTIONS   Avoid all smoking, herbs, alcohol, and unprescribed drugs. These chemicals affect the formation and growth of the baby.  Follow your caregiver's instructions regarding medicine use. There are medicines that are either safe or unsafe to take during pregnancy.  Exercise only as directed by your caregiver. Experiencing uterine cramps is a good sign to stop exercising.  Continue to eat regular, healthy meals.  Wear a good support bra for breast tenderness.  Do not use hot tubs, steam rooms, or saunas.  Wear your seat belt at all times when driving.  Avoid raw meat, uncooked cheese, cat litter boxes, and soil used by cats. These carry germs that can cause birth defects in the baby.  Take your prenatal vitamins.  Try taking a stool softener (if your caregiver approves) if you develop constipation. Eat more high-fiber foods, such as fresh vegetables or fruit and whole grains. Drink plenty of fluids to keep your urine clear or pale yellow.  Take warm sitz baths to soothe any pain or discomfort caused by hemorrhoids. Use hemorrhoid cream if your caregiver approves.  If you develop varicose veins, wear support hose. Elevate your feet for 15 minutes, 3-4 times a day. Limit salt in your diet.  Avoid heavy lifting, wear low heal shoes, and practice good posture.  Rest a lot with your legs elevated if you have leg cramps or low back pain.  Visit your dentist if you have not gone during your pregnancy. Use a soft toothbrush to brush your teeth and be gentle when you floss.  A sexual relationship may be continued unless your caregiver directs you otherwise.  Do not travel far distances unless it is absolutely necessary and only  with the approval of your caregiver.  Take prenatal classes to understand, practice, and ask questions about the labor and delivery.  Make a trial run to the hospital.  Pack your hospital bag.  Prepare the baby's nursery.  Continue to go to all your prenatal visits as directed by your caregiver. SEEK MEDICAL CARE IF:  You are unsure if you are in labor or if your water has broken.  You have dizziness.  You have mild pelvic cramps, pelvic pressure, or nagging pain in your abdominal area.  You have persistent nausea, vomiting, or diarrhea.  You have a bad smelling vaginal discharge.  You have pain with urination. SEEK IMMEDIATE MEDICAL CARE IF:   You have a fever.  You are leaking fluid from your vagina.  You have spotting or bleeding from your vagina.  You have severe abdominal cramping or pain.  You have rapid weight loss or gain.  You have shortness of breath with chest pain.  You notice sudden or extreme swelling   of your face, hands, ankles, feet, or legs.  You have not felt your baby move in over an hour.  You have severe headaches that do not go away with medicine.  You have vision changes. Document Released: 07/13/2001 Document Revised: 07/24/2013 Document Reviewed: 09/19/2012 [employer] Patient Information 2015 [employer]. This information is not intended to replace advice given to you by your health care provider. Make sure you discuss any questions you have with your health care provider.  Breastfeeding Deciding to breastfeed is one of the best choices you can make for you and your baby. A change in hormones during pregnancy causes your breast tissue to grow and increases the number and size of your milk ducts. These hormones also allow proteins, sugars, and fats from your blood supply to make breast milk in your milk-producing glands. Hormones prevent breast milk from being released before your baby is born as well as prompt milk flow after birth. Once  breastfeeding has begun, thoughts of your baby, as well as his or her sucking or crying, can stimulate the release of milk from your milk-producing glands.  BENEFITS OF BREASTFEEDING For Your Baby  Your first milk (colostrum) helps your baby's digestive system function better.   There are antibodies in your milk that help your baby fight off infections.   Your baby has a lower incidence of asthma, allergies, and sudden infant death syndrome.   The nutrients in breast milk are better for your baby than infant formulas and are designed uniquely for your baby's needs.   Breast milk improves your baby's brain development.   Your baby is less likely to develop other conditions, such as childhood obesity, asthma, or type 2 diabetes mellitus.  For You   Breastfeeding helps to create a very special bond between you and your baby.   Breastfeeding is convenient. Breast milk is always available at the correct temperature and costs nothing.   Breastfeeding helps to burn calories and helps you lose the weight gained during pregnancy.   Breastfeeding makes your uterus contract to its prepregnancy size faster and slows bleeding (lochia) after you give birth.   Breastfeeding helps to lower your risk of developing type 2 diabetes mellitus, osteoporosis, and breast or ovarian cancer later in life. SIGNS THAT YOUR BABY IS HUNGRY Early Signs of Hunger  Increased alertness or activity.  Stretching.  Movement of the head from side to side.  Movement of the head and opening of the mouth when the corner of the mouth or cheek is stroked (rooting).  Increased sucking sounds, smacking lips, cooing, sighing, or squeaking.  Hand-to-mouth movements.  Increased sucking of fingers or hands. Late Signs of Hunger  Fussing.  Intermittent crying. Extreme Signs of Hunger Signs of extreme hunger will require calming and consoling before your baby will be able to breastfeed successfully. Do not  wait for the following signs of extreme hunger to occur before you initiate breastfeeding:   Restlessness.  A loud, strong cry.   Screaming. BREASTFEEDING BASICS Breastfeeding Initiation  Find a comfortable place to sit or lie down, with your neck and back well supported.  Place a pillow or rolled up blanket under your baby to bring him or her to the level of your breast (if you are seated). Nursing pillows are specially designed to help support your arms and your baby while you breastfeed.  Make sure that your baby's abdomen is facing your abdomen.   Gently massage your breast. With your fingertips, massage from your chest   wall toward your nipple in a circular motion. This encourages milk flow. You may need to continue this action during the feeding if your milk flows slowly.  Support your breast with 4 fingers underneath and your thumb above your nipple. Make sure your fingers are well away from your nipple and your baby's mouth.   Stroke your baby's lips gently with your finger or nipple.   When your baby's mouth is open wide enough, quickly bring your baby to your breast, placing your entire nipple and as much of the colored area around your nipple (areola) as possible into your baby's mouth.   More areola should be visible above your baby's upper lip than below the lower lip.   Your baby's tongue should be between his or her lower gum and your breast.   Ensure that your baby's mouth is correctly positioned around your nipple (latched). Your baby's lips should create a seal on your breast and be turned out (everted).  It is common for your baby to suck about 2-3 minutes in order to start the flow of breast milk. Latching Teaching your baby how to latch on to your breast properly is very important. An improper latch can cause nipple pain and decreased milk supply for you and poor weight gain in your baby. Also, if your baby is not latched onto your nipple properly, he or she  may swallow some air during feeding. This can make your baby fussy. Burping your baby when you switch breasts during the feeding can help to get rid of the air. However, teaching your baby to latch on properly is still the best way to prevent fussiness from swallowing air while breastfeeding. Signs that your baby has successfully latched on to your nipple:    Silent tugging or silent sucking, without causing you pain.   Swallowing heard between every 3-4 sucks.    Muscle movement above and in front of his or her ears while sucking.  Signs that your baby has not successfully latched on to nipple:   Sucking sounds or smacking sounds from your baby while breastfeeding.  Nipple pain. If you think your baby has not latched on correctly, slip your finger into the corner of your baby's mouth to break the suction and place it between your baby's gums. Attempt breastfeeding initiation again. Signs of Successful Breastfeeding Signs from your baby:   A gradual decrease in the number of sucks or complete cessation of sucking.   Falling asleep.   Relaxation of his or her body.   Retention of a small amount of milk in his or her mouth.   Letting go of your breast by himself or herself. Signs from you:  Breasts that have increased in firmness, weight, and size 1-3 hours after feeding.   Breasts that are softer immediately after breastfeeding.  Increased milk volume, as well as a change in milk consistency and color by the fifth day of breastfeeding.   Nipples that are not sore, cracked, or bleeding. Signs That Your Baby is Getting Enough Milk  Wetting at least 3 diapers in a 24-hour period. The urine should be clear and pale yellow by age 5 days.  At least 3 stools in a 24-hour period by age 5 days. The stool should be soft and yellow.  At least 3 stools in a 24-hour period by age 7 days. The stool should be seedy and yellow.  No loss of weight greater than 10% of birth weight  during the first 3   days of age.  Average weight gain of 4-7 ounces (113-198 g) per week after age 4 days.  Consistent daily weight gain by age 5 days, without weight loss after the age of 2 weeks. After a feeding, your baby may spit up a small amount. This is common. BREASTFEEDING FREQUENCY AND DURATION Frequent feeding will help you make more milk and can prevent sore nipples and breast engorgement. Breastfeed when you feel the need to reduce the fullness of your breasts or when your baby shows signs of hunger. This is called "breastfeeding on demand." Avoid introducing a pacifier to your baby while you are working to establish breastfeeding (the first 4-6 weeks after your baby is born). After this time you may choose to use a pacifier. Research has shown that pacifier use during the first year of a baby's life decreases the risk of sudden infant death syndrome (SIDS). Allow your baby to feed on each breast as long as he or she wants. Breastfeed until your baby is finished feeding. When your baby unlatches or falls asleep while feeding from the first breast, offer the second breast. Because newborns are often sleepy in the first few weeks of life, you may need to awaken your baby to get him or her to feed. Breastfeeding times will vary from baby to baby. However, the following rules can serve as a guide to help you ensure that your baby is properly fed:  Newborns (babies 4 weeks of age or younger) may breastfeed every 1-3 hours.  Newborns should not go longer than 3 hours during the day or 5 hours during the night without breastfeeding.  You should breastfeed your baby a minimum of 8 times in a 24-hour period until you begin to introduce solid foods to your baby at around 6 months of age. BREAST MILK PUMPING Pumping and storing breast milk allows you to ensure that your baby is exclusively fed your breast milk, even at times when you are unable to breastfeed. This is especially important if you are  going back to work while you are still breastfeeding or when you are not able to be present during feedings. Your lactation consultant can give you guidelines on how long it is safe to store breast milk.  A breast pump is a machine that allows you to pump milk from your breast into a sterile bottle. The pumped breast milk can then be stored in a refrigerator or freezer. Some breast pumps are operated by hand, while others use electricity. Ask your lactation consultant which type will work best for you. Breast pumps can be purchased, but some hospitals and breastfeeding support groups lease breast pumps on a monthly basis. A lactation consultant can teach you how to hand express breast milk, if you prefer not to use a pump.  CARING FOR YOUR BREASTS WHILE YOU BREASTFEED Nipples can become dry, cracked, and sore while breastfeeding. The following recommendations can help keep your breasts moisturized and healthy:  Avoid using soap on your nipples.   Wear a supportive bra. Although not required, special nursing bras and tank tops are designed to allow access to your breasts for breastfeeding without taking off your entire bra or top. Avoid wearing underwire-style bras or extremely tight bras.  Air dry your nipples for 3-4minutes after each feeding.   Use only cotton bra pads to absorb leaked breast milk. Leaking of breast milk between feedings is normal.   Use lanolin on your nipples after breastfeeding. Lanolin helps to maintain your skin's   normal moisture barrier. If you use pure lanolin, you do not need to wash it off before feeding your baby again. Pure lanolin is not toxic to your baby. You may also hand express a few drops of breast milk and gently massage that milk into your nipples and allow the milk to air dry. In the first few weeks after giving birth, some women experience extremely full breasts (engorgement). Engorgement can make your breasts feel heavy, warm, and tender to the touch.  Engorgement peaks within 3-5 days after you give birth. The following recommendations can help ease engorgement:  Completely empty your breasts while breastfeeding or pumping. You may want to start by applying warm, moist heat (in the shower or with warm water-soaked hand towels) just before feeding or pumping. This increases circulation and helps the milk flow. If your baby does not completely empty your breasts while breastfeeding, pump any extra milk after he or she is finished.  Wear a snug bra (nursing or regular) or tank top for 1-2 days to signal your body to slightly decrease milk production.  Apply ice packs to your breasts, unless this is too uncomfortable for you.  Make sure that your baby is latched on and positioned properly while breastfeeding. If engorgement persists after 48 hours of following these recommendations, contact your health care provider or a lactation consultant. OVERALL HEALTH CARE RECOMMENDATIONS WHILE BREASTFEEDING  Eat healthy foods. Alternate between meals and snacks, eating 3 of each per day. Because what you eat affects your breast milk, some of the foods may make your baby more irritable than usual. Avoid eating these foods if you are sure that they are negatively affecting your baby.  Drink milk, fruit juice, and water to satisfy your thirst (about 10 glasses a day).   Rest often, relax, and continue to take your prenatal vitamins to prevent fatigue, stress, and anemia.  Continue breast self-awareness checks.  Avoid chewing and smoking tobacco.  Avoid alcohol and drug use. Some medicines that may be harmful to your baby can pass through breast milk. It is important to ask your health care provider before taking any medicine, including all over-the-counter and prescription medicine as well as vitamin and herbal supplements. It is possible to become pregnant while breastfeeding. If birth control is desired, ask your health care provider about options that  will be safe for your baby. SEEK MEDICAL CARE IF:   You feel like you want to stop breastfeeding or have become frustrated with breastfeeding.  You have painful breasts or nipples.  Your nipples are cracked or bleeding.  Your breasts are red, tender, or warm.  You have a swollen area on either breast.  You have a fever or chills.  You have nausea or vomiting.  You have drainage other than breast milk from your nipples.  Your breasts do not become full before feedings by the fifth day after you give birth.  You feel sad and depressed.  Your baby is too sleepy to eat well.  Your baby is having trouble sleeping.   Your baby is wetting less than 3 diapers in a 24-hour period.  Your baby has less than 3 stools in a 24-hour period.  Your baby's skin or the white part of his or her eyes becomes yellow.   Your baby is not gaining weight by 5 days of age. SEEK IMMEDIATE MEDICAL CARE IF:   Your baby is overly tired (lethargic) and does not want to wake up and feed.  Your baby   develops an unexplained fever. Document Released: 07/19/2005 Document Revised: 07/24/2013 Document Reviewed: 01/10/2013 [employer] Patient Information 2015 [employer]. This information is not intended to replace advice given to you by your health care provider. Make sure you discuss any questions you have with your health care provider.  

## 2014-08-13 NOTE — Progress Notes (Signed)
GBS negative  Labor precautions reviewed

## 2014-08-16 ENCOUNTER — Encounter (HOSPITAL_COMMUNITY): Payer: Self-pay

## 2014-08-16 ENCOUNTER — Inpatient Hospital Stay (HOSPITAL_COMMUNITY)
Admission: AD | Admit: 2014-08-16 | Discharge: 2014-08-18 | DRG: 775 | Disposition: A | Discharge status: Home / Self Care | Payer: Medicaid Other | Source: Ambulatory Visit | Attending: Advanced Practice Midwife | Admitting: Advanced Practice Midwife

## 2014-08-16 DIAGNOSIS — Z3A37 37 weeks gestation of pregnancy: Secondary | ICD-10-CM | POA: Diagnosis present

## 2014-08-16 DIAGNOSIS — Z822 Family history of deafness and hearing loss: Secondary | ICD-10-CM | POA: Diagnosis not present

## 2014-08-16 DIAGNOSIS — O99824 Streptococcus B carrier state complicating childbirth: Secondary | ICD-10-CM | POA: Diagnosis present

## 2014-08-16 DIAGNOSIS — Z87891 Personal history of nicotine dependence: Secondary | ICD-10-CM

## 2014-08-16 DIAGNOSIS — Z825 Family history of asthma and other chronic lower respiratory diseases: Secondary | ICD-10-CM

## 2014-08-16 DIAGNOSIS — O4693 Antepartum hemorrhage, unspecified, third trimester: Secondary | ICD-10-CM | POA: Diagnosis present

## 2014-08-16 LAB — CBC
HCT: 36.5 % (ref 36.0–46.0)
Hemoglobin: 12.2 g/dL (ref 12.0–15.0)
MCH: 25.7 pg — AB (ref 26.0–34.0)
MCHC: 33.4 g/dL (ref 30.0–36.0)
MCV: 76.8 fL — AB (ref 78.0–100.0)
Platelets: 139 10*3/uL — ABNORMAL LOW (ref 150–400)
RBC: 4.75 MIL/uL (ref 3.87–5.11)
RDW: 15 % (ref 11.5–15.5)
WBC: 9.7 10*3/uL (ref 4.0–10.5)

## 2014-08-16 MED ORDER — OXYTOCIN BOLUS FROM INFUSION
500.0000 mL | INTRAVENOUS | Status: DC
  Administered 2014-08-16: 500 mL via INTRAVENOUS

## 2014-08-16 MED ORDER — MISOPROSTOL 200 MCG PO TABS
ORAL_TABLET | ORAL | Status: AC
  Filled 2014-08-16: qty 4

## 2014-08-16 MED ORDER — OXYTOCIN 40 UNITS IN LACTATED RINGERS INFUSION - SIMPLE MED: 62.5000 mL/h | INTRAVENOUS | Status: DC

## 2014-08-16 MED ORDER — FLEET ENEMA 7-19 GM/118ML RE ENEM: 1.0000 | ENEMA | RECTAL | Status: DC | PRN

## 2014-08-16 MED ORDER — LACTATED RINGERS IV SOLN: 500.0000 mL | INTRAVENOUS | Status: DC | PRN

## 2014-08-16 MED ORDER — IBUPROFEN 600 MG PO TABS
600.0000 mg | ORAL_TABLET | Freq: Four times a day (QID) | ORAL | Status: DC
  Administered 2014-08-16 – 2014-08-18 (×7): 600 mg via ORAL
  Filled 2014-08-16 (×7): qty 1

## 2014-08-16 MED ORDER — ACETAMINOPHEN 325 MG PO TABS: 650.0000 mg | ORAL_TABLET | ORAL | Status: DC | PRN

## 2014-08-16 MED ORDER — LIDOCAINE HCL (PF) 1 % IJ SOLN
INTRAMUSCULAR | Status: AC
  Filled 2014-08-16: qty 30

## 2014-08-16 MED ORDER — MISOPROSTOL 200 MCG PO TABS
800.0000 ug | ORAL_TABLET | Freq: Once | ORAL | Status: AC
  Administered 2014-08-16: 800 ug via ORAL

## 2014-08-16 MED ORDER — OXYTOCIN 40 UNITS IN LACTATED RINGERS INFUSION - SIMPLE MED
INTRAVENOUS | Status: AC
  Filled 2014-08-16: qty 1000

## 2014-08-16 MED ORDER — OXYCODONE-ACETAMINOPHEN 5-325 MG PO TABS: 2.0000 | ORAL_TABLET | ORAL | Status: DC | PRN

## 2014-08-16 MED ORDER — LIDOCAINE HCL (PF) 1 % IJ SOLN
30.0000 mL | INTRAMUSCULAR | Status: DC | PRN
  Filled 2014-08-16: qty 30

## 2014-08-16 MED ORDER — CITRIC ACID-SODIUM CITRATE 334-500 MG/5ML PO SOLN
30.0000 mL | ORAL | Status: DC | PRN
Start: 2014-08-16 — End: 2014-08-17

## 2014-08-16 MED ORDER — OXYCODONE-ACETAMINOPHEN 5-325 MG PO TABS
1.0000 | ORAL_TABLET | ORAL | Status: DC | PRN
  Administered 2014-08-16 – 2014-08-17 (×2): 1 via ORAL
  Filled 2014-08-16 (×2): qty 1

## 2014-08-16 MED ORDER — LACTATED RINGERS IV SOLN
INTRAVENOUS | Status: DC
  Administered 2014-08-16: 23:00:00 via INTRAVENOUS

## 2014-08-16 MED ORDER — ONDANSETRON HCL 4 MG/2ML IJ SOLN: 4.0000 mg | Freq: Four times a day (QID) | INTRAMUSCULAR | Status: DC | PRN

## 2014-08-16 NOTE — [facility] Note (Signed)
[place] [facility] [place] Will go to [facility] 164

## 2014-08-16 NOTE — MAU Provider Note (Signed)
Chief Complaint:  Labor Eval  HPI: Stacey Carrillo is a 24 y.o. [id number] at [redacted]w[redacted]d who presents to maternity admissions reporting increased vaginal bleeding and contractions. This has been ongoing for approximately 24hr. No rush of fluid. Good fetal movement.  Patient was observed and rechecked after one hour. Cervical change was noted, from 5cm to 7cm.  Denies fever, chills, HA, vision change, SOB, fatigue/weakness.  Pregnancy Course:  Clinic [facility]  Genetic Screen  quad: normal  Anatomic US  WNL  Glucose Screen 144   3 hour GTT: normal  TDaP vaccine 06/12/14  Flu vaccine 05/01/14  Pap Normal  CF Screen Negative  GBS  Negative  Baby Food Breast  Contraception Depo vs BTS; papers signed 07/03/14  Pediatrician [facility]    Past Medical History: Past Medical History  Diagnosis Date  . Anemia   . Preterm labor   . Group B streptococcal infection   . HPV (human papilloma virus) infection   . Abnormal Pap smear   . Hepatitis B     Past obstetric history: OB History  Gravida Para Term Preterm AB SAB TAB Ectopic Multiple Living  0 0 0 4    # Outcome Date GA Lbr Len/2nd Weight Sex Delivery Anes PTL Lv  8 Current           7 Term 07/17/13 [redacted]w[redacted]d 01:24 / 00:02 3.225 kg (7 lb 1.8 oz) F Vag-Spont None  Y  6 Term 11/24/11 [redacted]w[redacted]d / 00:01 3.909 kg (8 lb 9.9 oz) M Vag-Spont None  Y  5 TAB 04/2010 [redacted]w[redacted]d       N  4 TAB 10/2009 106w0d       N  3 Term 02/21/08 [redacted]w[redacted]d  2.92 kg (6 lb 7 oz) F Vag-Spont EPI  Y  2 Term 04/28/06 [redacted]w[redacted]d  2.778 kg (6 lb 2 oz) M Vag-Spont Gen  Y  1 SAB              Comments: System Generated. Please review and update pregnancy details.      Past Surgical History: Past Surgical History  Procedure Laterality Date  . Dilation and curettage of uterus       Family History: Family History  Problem Relation Age of Onset  . Hearing loss Brother   . Asthma Son     Social History: History  Substance Use Topics  . Smoking status:  Former Smoker -- 0.25 packs/day    Types: Cigarettes    Quit date: 04/05/2014  . Smokeless tobacco: Never Used     Comment: pt stated she is trying to quite down to 1 ciggarette/day  . Alcohol Use: Yes     Comment: social when not pregnant    Allergies: No Known Allergies  Meds:  Prescriptions prior to admission  Medication Sig Dispense Refill Last Dose  . Prenat w/o A Vit-FeFum-FePo-FA (CONCEPT OB) 130-92.4-1 MG CAPS Take 1 capsule by mouth daily. 30 capsule 11 08/15/2014 at Unknown time    ROS: Pertinent findings in history of present illness.  Physical Exam  Blood pressure 109/75, pulse 73, last menstrual period 11/14/2013, unknown if currently breastfeeding. GENERAL: Well-developed, well-nourished female in no acute distress.  HEENT: normocephalic HEART: normal rate RESP: normal effort ABDOMEN: Soft, non-tender, gravid appropriate for gestational age EXTREMITIES: Nontender, no edema NEURO: alert and oriented SPECULUM EXAM: BRB in vaginal vault, cervix open partially. No pooling.  Dilation: 5 Effacement (%): 80, 90 Cervical Position: Middle Station: -  1 Presentation: Vertex Exam by:: Sharen Hintaroline Brewer RN  FHT:  Baseline 150, moderate variability, accelerations present, no decelerations Contractions: irreg   Labs: No results found for this or any previous visit (from the past 24 hour(s)).  Imaging:  No results found. MAU Course: CBC obtained Watched for cervical change: 5cm>7cm  Assessment: 1. Labor: active 2. Fetal Wellbeing: Category 1  3. Pain Control: none 4. GBS: neg 5. 37.5 week IUP  Plan:  1. Admit to BS per consult with MD 2. Routine L&D orders 3. Analgesia/anesthesia PRN        Medication List    ASK your doctor about these medications        CONCEPT OB 130-92.4-1 MG Caps  Take 1 capsule by mouth daily.        Kathee DeltonIan D McKeag, MD 08/16/2014 10:00 PM   I have seen and examined this patient and I agree with the above. Cam HaiSHAW, Kylo Gavin  CNM 12:56 AM 08/17/2014

## 2014-08-16 NOTE — Progress Notes (Signed)
Notified of pt vaginal exam and amount of bleeding. Will come see pt

## 2014-08-16 NOTE — Progress Notes (Signed)
Notified of pt cervical exam. Will put in admit orders

## 2014-08-16 NOTE — MAU Note (Signed)
Pt presents complaining of contractions every 5-7 minutes since this afternoon. States she is having a moderate amount of bleeding as well. Reports good fetal movement.

## 2014-08-17 ENCOUNTER — Encounter (HOSPITAL_COMMUNITY): Payer: Self-pay | Admitting: *Deleted

## 2014-08-17 LAB — TYPE AND SCREEN
ABO/RH(D): A POS
Antibody Screen: NEGATIVE

## 2014-08-17 MED ORDER — DIPHENHYDRAMINE HCL 25 MG PO CAPS: 25.0000 mg | ORAL_CAPSULE | Freq: Four times a day (QID) | ORAL | Status: DC | PRN

## 2014-08-17 MED ORDER — PNEUMOCOCCAL VAC POLYVALENT 25 MCG/0.5ML IJ INJ
0.5000 mL | INJECTION | INTRAMUSCULAR | Status: AC
  Administered 2014-08-18: 0.5 mL via INTRAMUSCULAR
  Filled 2014-08-17: qty 0.5

## 2014-08-17 MED ORDER — ONDANSETRON HCL 4 MG/2ML IJ SOLN: 4.0000 mg | INTRAMUSCULAR | Status: DC | PRN

## 2014-08-17 MED ORDER — PRENATAL MULTIVITAMIN CH
1.0000 | ORAL_TABLET | Freq: Every day | ORAL | Status: DC
  Administered 2014-08-17 – 2014-08-18 (×2): 1 via ORAL
  Filled 2014-08-17 (×2): qty 1

## 2014-08-17 MED ORDER — LANOLIN HYDROUS EX OINT: TOPICAL_OINTMENT | CUTANEOUS | Status: DC | PRN

## 2014-08-17 MED ORDER — BENZOCAINE-MENTHOL 20-0.5 % EX AERO
1.0000 "application " | INHALATION_SPRAY | CUTANEOUS | Status: DC | PRN
  Administered 2014-08-17: 1 via TOPICAL
  Filled 2014-08-17: qty 56

## 2014-08-17 MED ORDER — METHYLERGONOVINE MALEATE 0.2 MG/ML IJ SOLN
0.2000 mg | INTRAMUSCULAR | Status: DC | PRN
  Administered 2014-08-17: 0.2 mg via INTRAMUSCULAR

## 2014-08-17 MED ORDER — TETANUS-DIPHTH-ACELL PERTUSSIS 5-2.5-18.5 LF-MCG/0.5 IM SUSP: 0.5000 mL | Freq: Once | INTRAMUSCULAR | Status: DC

## 2014-08-17 MED ORDER — WITCH HAZEL-GLYCERIN EX PADS
1.0000 "application " | MEDICATED_PAD | CUTANEOUS | Status: DC | PRN
  Administered 2014-08-17: 1 via TOPICAL

## 2014-08-17 MED ORDER — SENNOSIDES-DOCUSATE SODIUM 8.6-50 MG PO TABS
2.0000 | ORAL_TABLET | ORAL | Status: DC
  Administered 2014-08-17 – 2014-08-18 (×2): 2 via ORAL
  Filled 2014-08-17 (×2): qty 2

## 2014-08-17 MED ORDER — ONDANSETRON HCL 4 MG PO TABS: 4.0000 mg | ORAL_TABLET | ORAL | Status: DC | PRN

## 2014-08-17 MED ORDER — SIMETHICONE 80 MG PO CHEW: 80.0000 mg | CHEWABLE_TABLET | ORAL | Status: DC | PRN

## 2014-08-17 MED ORDER — OXYCODONE-ACETAMINOPHEN 5-325 MG PO TABS
1.0000 | ORAL_TABLET | ORAL | Status: DC | PRN
  Administered 2014-08-17 – 2014-08-18 (×4): 1 via ORAL
  Filled 2014-08-17 (×4): qty 1

## 2014-08-17 MED ORDER — METHYLERGONOVINE MALEATE 0.2 MG PO TABS: 0.2000 mg | ORAL_TABLET | ORAL | Status: DC | PRN

## 2014-08-17 MED ORDER — DIBUCAINE 1 % RE OINT: 1.0000 "application " | TOPICAL_OINTMENT | RECTAL | Status: DC | PRN

## 2014-08-17 MED ORDER — MEASLES, MUMPS & RUBELLA VAC ~~LOC~~ INJ
0.5000 mL | INJECTION | Freq: Once | SUBCUTANEOUS | Status: AC
  Administered 2014-08-17: 0.5 mL via SUBCUTANEOUS
  Filled 2014-08-17 (×2): qty 0.5

## 2014-08-17 MED ORDER — OXYTOCIN 40 UNITS IN LACTATED RINGERS INFUSION - SIMPLE MED: 62.5000 mL/h | INTRAVENOUS | Status: DC | PRN

## 2014-08-17 NOTE — H&P (Signed)
Chief Complaint: Labor Eval  HPI: Stacey MoselleFatima Carrillo is a 24 y.o. [phone] [id number] at 4231w5d who presents to maternity admissions reporting increased vaginal bleeding and contractions. This has been ongoing for approximately 24hr. No rush of fluid. Good fetal movement.  Patient was observed and rechecked after one hour. Cervical change was noted, from 5cm to 7cm.  Denies fever, chills, HA, vision change, SOB, fatigue/weakness.  Pregnancy Course:  [facility]  Genetic Screen quad: normal  Anatomic US WNL  Glucose Screen 144 3 hour GTT: normal  TDaP vaccine 06/12/14  Flu vaccine 05/01/14  Pap Normal  CF Screen Negative  GBS Negative  Baby Food Breast  Contraception Depo vs BTS; papers signed 07/03/14  Pediatrician [facility]    Past Medical History: Past Medical History  Diagnosis Date  . Anemia   . Preterm labor   . Group B streptococcal infection   . HPV (human papilloma virus) infection   . Abnormal Pap smear   . Hepatitis B     Past obstetric history: OB History  Gravida Para Term Preterm AB SAB TAB Ectopic Multiple Living  8 4 4  0 3 1 2  0 0 4    # Outcome Date GA Lbr Len/2nd Weight Sex Delivery Anes PTL Lv  8 Current           7 Term 07/17/13 2766w3d 01:24 / 00:02 3.225 kg (7 lb 1.8 oz) F Vag-Spont None  Y  6 Term 11/24/11 4567w5d / 00:01 3.909 kg (8 lb 9.9 oz) M Vag-Spont None  Y  5 TAB 04/2010 6249w0d       N  4 TAB 10/2009 3357w0d       N  3 Term 02/21/08 6569w0d  2.92 kg (6 lb 7 oz) F Vag-Spont EPI  Y  2 Term 04/28/06 662w0d  2.778 kg (6 lb 2 oz) M Vag-Spont Gen  Y  1 SAB            Comments: System Generated. Please review and update pregnancy details.      Past Surgical History: Past Surgical History  Procedure Laterality Date  . Dilation and curettage of uterus       Family History: Family History  Problem Relation Age of Onset  . Hearing loss Brother   . Asthma Son     Social History: History  Substance Use Topics  . Smoking status: Former Smoker -- 0.25 packs/day    Types: Cigarettes    Quit date: 04/05/2014  . Smokeless tobacco: Never Used     Comment: pt stated she is trying to quite down to 1 ciggarette/day  . Alcohol Use: Yes     Comment: social when not pregnant    Allergies: No Known Allergies  Meds:  Prescriptions prior to admission  Medication Sig Dispense Refill Last Dose  . Prenat w/o A Vit-FeFum-FePo-FA (CONCEPT OB) 130-92.4-1 MG CAPS Take 1 capsule by mouth daily. 30 capsule 11 08/15/2014 at Unknown time    ROS: Pertinent findings in history of present illness.  Physical Exam  Blood pressure 109/75, pulse 73, last menstrual period 11/14/2013, unknown if currently breastfeeding. GENERAL: Well-developed, well-nourished female in no acute distress.  HEENT: normocephalic HEART: normal rate RESP: normal effort ABDOMEN: Soft, non-tender, gravid appropriate for gestational age EXTREMITIES: Nontender, no edema NEURO: alert and oriented SPECULUM EXAM: BRB in vaginal vault, cervix open partially. No pooling.  Dilation: 5 Effacement (%): 80, 90 Cervical Position: Middle Station: -1 Presentation: Vertex Exam by:: Sharen Hintaroline Brewer RN  FHT: Baseline 150, moderate variability, accelerations present, no decelerations Contractions: irreg  Labs:  Lab Results Last 24 Hours    No results found for this or any previous visit (from the past 24 hour(s)).    Imaging:   Imaging Results    No results found.   MAU Course: CBC obtained Watched for cervical change: 5cm>7cm  Assessment: 1. Labor: active 2. Fetal Wellbeing: Category 1  3. Pain Control: none 4. GBS: neg 5. 37.5 week IUP  Plan:  1. Admit to BS per consult with MD 2. Routine L&D orders 3.  Analgesia/anesthesia PRN       Medication List    ASK your doctor about these medications       CONCEPT OB 130-92.4-1 MG Caps  Take 1 capsule by mouth daily.        Kathee Delton, MD 08/16/2014 10:00 PM   I have seen and examined this patient and I agree with the above. Cam Hai CNM 12:56 AM 08/17/2014

## 2014-08-17 NOTE — Progress Notes (Signed)
Post Partum Day 1 Subjective: no complaints, up ad lib, voiding and tolerating PO  Objective: Blood pressure 123/71, pulse 77, temperature 98.8 F (37.1 C), temperature source Oral, resp. rate 18, height 5\' 7"  (1.702 m), weight 190 lb (86.183 kg), last menstrual period 11/14/2013, SpO2 98 %, unknown if currently breastfeeding.  Physical Exam:  General: alert, cooperative and no distress Lochia: appropriate Uterine Fundus: firm Incision:  DVT Evaluation: No evidence of DVT seen on physical exam.   Recent Labs  08/16/14 2135  HGB 12.2  HCT 36.5    Assessment/Plan: Plan for discharge tomorrow and Breastfeeding   LOS: 1 day   Steve Gregg H 08/17/2014, 8:42 AM

## 2014-08-17 NOTE — Lactation Note (Signed)
This note was copied from the chart of Stacey Carrillo. Lactation Consultation Note:  Assisted mom with a deeper latch and she reported increased comfort.  Baby ate with long jaw excursions and some swallows were noted.  She was asleep when she came off the breast.  I educated parents on how to latch the baby deeply.  Feeding cues were taught as was hand expression.  Parents were satisfied with this feeding.  Mom desires exclusive BF but reports that the grandmother is trying to get her to feed the baby formula.  Encouragement given to parents.  Patient Name: Stacey Carrillo Today's Date: 08/17/2014 Reason for consult: Initial assessment   Maternal Data    Feeding Feeding Type: Breast Fed Length of feed: 15 min  LATCH Score/Interventions Latch: Grasps breast easily, tongue down, lips flanged, rhythmical sucking.  Audible Swallowing: A few with stimulation  Type of Nipple: Everted at rest and after stimulation (centers have small dimples)  Comfort (Breast/Nipple): Filling, red/small blisters or bruises, mild/mod discomfort  Problem noted: Cracked, bleeding, blisters, bruises Interventions  (Cracked/bleeding/bruising/blister): Expressed breast milk to nipple Interventions (Mild/moderate discomfort): Comfort gels  Hold (Positioning): Assistance needed to correctly position infant at breast and maintain latch. Intervention(s): Breastfeeding basics reviewed;Support Pillows;Position options;Skin to skin  LATCH Score: 7  Lactation Tools Discussed/Used     Consult Status Consult Status: Follow-up Date: 08/18/14    Soyla DryerJoseph, Hamna Asa 08/17/2014, 1:50 PM

## 2014-08-18 NOTE — Discharge Summary (Signed)
Obstetric Discharge Summary Reason for Admission: onset of labor Prenatal Procedures: NST and ultrasound Intrapartum Procedures: spontaneous vaginal delivery Postpartum Procedures: none Complications-Operative and Postpartum: none HEMOGLOBIN  Date Value Ref Range Status  08/16/2014 12.2 12.0 - 15.0 g/dL Final   HCT  Date Value Ref Range Status  08/16/2014 36.5 36.0 - 46.0 % Final    Physical Exam:  General: alert, cooperative, appears stated age and no distress Lochia: appropriate Uterine Fundus: firm Incision: n/a DVT Evaluation: No evidence of DVT seen on physical exam.  Discharge Diagnoses: Term Pregnancy-delivered  Discharge Information: Date: 08/18/2014 Activity: pelvic rest Diet: routine Medications: PNV Condition: stable Instructions: refer to practice specific booklet Discharge to: home Follow-up Information    [facility]. Schedule an appointment as soon as possible for a visit in 6 weeks.   Specialty:  Obstetrics and Gynecology   Contact information:   [address] [place] [id number] [phone]      Newborn Data: Live born female  Birth Weight: 7 lb 4.2 oz (3295 g) APGAR: 8, 9  Home with mother and father.  Stacey Carrillo, [name] 08/18/2014, 7:36 AM

## 2014-08-18 NOTE — Progress Notes (Signed)
Discharge teaching complete. Pt understood all instructions and did not have any questions. Pt ambulated out of the hospital and discharged home to family.  

## 2014-08-18 NOTE — Lactation Note (Signed)
This note was copied from the chart [facility]. Lactation Consultation Note  Patient Name: Stacey Carrillo ZOXWR'UToday's Date: 08/18/2014 Reason for consult: Follow-up assessment Mom had baby latched when I arrived. Baby demonstrating a good rhythmic suck with few swallows noted. Mom has started to supplement. Mom reports she is BF before giving supplement. Encouraged to BF both breasts each feeding for 15-20 minutes at least before giving supplements. Encouraged to limit supplements today to 15 ml. Discussed with Mom large amounts of formula supplementation will affect how vigorous baby is at the breast which may delay her milk coming in. Gave Mom hand pump for home use. Mom plans to purchase a pump per her report. Encouraged post pumping to encourage milk production.  Engorgement care reviewed if needed. Advised of milk storage guidelines, refer to page 24-25 of Baby N Me Booklet. Advised of OP services and support group. Mom denies other questions/concerns.   Maternal Data    Feeding Feeding Type: Breast Fed Nipple Type: Slow - flow Length of feed: 20 min  LATCH Score/Interventions Latch: Grasps breast easily, tongue down, lips flanged, rhythmical sucking.  Audible Swallowing: A few with stimulation  Type of Nipple: Everted at rest and after stimulation  Comfort (Breast/Nipple): Soft / non-tender     Hold (Positioning): No assistance needed to correctly position infant at breast. Intervention(s): Breastfeeding basics reviewed  LATCH Score: 9  Lactation Tools Discussed/Used Tools: Pump Breast pump type: Double-Electric Breast Pump WIC Program: No   Consult Status Consult Status: Complete Date: 08/18/14 Follow-up type: In-patient    Alfred LevinsGranger, Jeyden Coffelt Ann 08/18/2014, 11:55 AM

## 2014-08-18 NOTE — Discharge Instructions (Signed)

## 2014-08-19 LAB — RPR: RPR: NONREACTIVE

## 2014-08-20 ENCOUNTER — Encounter: Payer: Medicaid Other | Admitting: Family Medicine

## 2014-09-26 ENCOUNTER — Ambulatory Visit: Payer: Medicaid Other | Admitting: Obstetrics & Gynecology

## 2014-10-10 ENCOUNTER — Encounter: Payer: Self-pay | Admitting: Obstetrics & Gynecology

## 2014-10-10 ENCOUNTER — Ambulatory Visit (INDEPENDENT_AMBULATORY_CARE_PROVIDER_SITE_OTHER): Payer: Medicaid Other | Admitting: Obstetrics & Gynecology

## 2014-10-10 DIAGNOSIS — Z3041 Encounter for surveillance of contraceptive pills: Secondary | ICD-10-CM

## 2014-10-10 MED ORDER — NORETHINDRONE 0.35 MG PO TABS: 1.0000 | ORAL_TABLET | Freq: Every day | ORAL | Status: DC

## 2014-10-10 NOTE — Progress Notes (Signed)
    Subjective:     Stacey Carrillo is a 24 y.o. [id number] female who presents for a postpartum visit. She is 7 weeks postpartum following a spontaneous vaginal delivery. I have fully reviewed the prenatal and intrapartum course. The delivery was at 37.5 gestational weeks.  Anesthesia: none. Postpartum course has been uncomplicated. Baby's course has been uncomplicated. Baby is feeding by breast. Bleeding no bleeding. Bowel function is normal. Bladder function is normal. Patient is sexually active. Contraception method is vasectomy which is planned on 10/15/14; will plan to use POPs in the interim and until post vasectomy check. Postpartum depression screening: negative.  The following portions of the patient's history were reviewed and updated as appropriate: allergies, current medications, past family history, past medical history, past social history, past surgical history and problem list.  02/06/13 Normal pap, negative GC and Chlam.  Review of Systems Pertinent items are noted in HPI.   Objective:    BP 110/81 mmHg  Pulse 74  Wt 180 lb (81.647 kg)  Breastfeeding? Yes  General:  alert and no distress   Breasts:  inspection negative, no nipple discharge or bleeding, no masses or nodularity palpable  Lungs: clear to auscultation bilaterally  Heart:  regular rate and rhythm  Abdomen: soft, non-tender; bowel sounds normal; no masses,  no organomegaly   Vulva:  normal  Vagina: normal vagina, no discharge, exudate, lesion, or erythema  Cervix:  multiparous appearance and no cervical motion tenderness  Corpus: normal size, contour, position, consistency, mobility, non-tender  Adnexa:  normal adnexa and no mass, fullness, tenderness  Rectal Exam: Not performed.        Assessment:   Normal postpartum exam. Pap smear not done at today's visit.   Plan:   1. Contraception: oral progesterone-only contraceptive prescribed today and vasectomy later. Told to use backup for next seven days. 2. Follow up  as needed or for next pap smear in 01/2016.   Jaynie CollinsUGONNA  Raechelle Sarti, MD, FACOG Attending Obstetrician & Gynecologist Faculty Practice, [facility] -

## 2014-10-10 NOTE — Patient Instructions (Signed)
Return to clinic for any scheduled appointments or for any gynecologic concerns as needed.   

## 2015-02-26 ENCOUNTER — Inpatient Hospital Stay (HOSPITAL_COMMUNITY)
Admission: AD | Admit: 2015-02-26 | Discharge: 2015-02-26 | Discharge status: Against Medical Advice | Payer: Medicaid Other | Source: Ambulatory Visit | Attending: Obstetrics & Gynecology | Admitting: Obstetrics & Gynecology

## 2015-02-26 DIAGNOSIS — Z3200 Encounter for pregnancy test, result unknown: Secondary | ICD-10-CM | POA: Diagnosis present

## 2015-02-26 DIAGNOSIS — Z3201 Encounter for pregnancy test, result positive: Secondary | ICD-10-CM | POA: Diagnosis not present

## 2015-02-26 LAB — URINALYSIS, ROUTINE W REFLEX MICROSCOPIC
BILIRUBIN URINE: NEGATIVE
Glucose, UA: NEGATIVE mg/dL
Ketones, ur: 15 mg/dL — AB
Leukocytes, UA: NEGATIVE
Nitrite: NEGATIVE
Protein, ur: NEGATIVE mg/dL
SPECIFIC GRAVITY, URINE: 1.02 (ref 1.005–1.030)
Urobilinogen, UA: 1 mg/dL (ref 0.0–1.0)
pH: 6.5 (ref 5.0–8.0)

## 2015-02-26 LAB — URINE MICROSCOPIC-ADD ON

## 2015-02-26 NOTE — MAU Note (Addendum)
Delivered back in January. Stopped breast feeding  2 months ago had not had a period yet. Pt stated she has been spotting on and off since Sunday. Today has had heavier bleeding and passing clots c/o cramping as well. Cramping better now after she passed the clots in toliet. Thought she might be having a miscarriage. Had NOT taken a pregnancy test.

## 2015-02-27 LAB — POCT PREGNANCY, URINE: PREG TEST UR: POSITIVE — AB

## 2015-06-03 ENCOUNTER — Emergency Department (HOSPITAL_COMMUNITY)
Admission: EM | Admit: 2015-06-03 | Discharge: 2015-06-03 | Disposition: A | Discharge status: Home / Self Care | Payer: Medicaid Other | Attending: Emergency Medicine | Admitting: Emergency Medicine

## 2015-06-03 ENCOUNTER — Encounter (HOSPITAL_COMMUNITY): Payer: Self-pay | Admitting: Emergency Medicine

## 2015-06-03 ENCOUNTER — Emergency Department (HOSPITAL_COMMUNITY): Payer: Medicaid Other

## 2015-06-03 DIAGNOSIS — Z8751 Personal history of pre-term labor: Secondary | ICD-10-CM | POA: Insufficient documentation

## 2015-06-03 DIAGNOSIS — Z8619 Personal history of other infectious and parasitic diseases: Secondary | ICD-10-CM | POA: Insufficient documentation

## 2015-06-03 DIAGNOSIS — Z79899 Other long term (current) drug therapy: Secondary | ICD-10-CM | POA: Insufficient documentation

## 2015-06-03 DIAGNOSIS — S0003XA Contusion of scalp, initial encounter: Secondary | ICD-10-CM

## 2015-06-03 DIAGNOSIS — Y9389 Activity, other specified: Secondary | ICD-10-CM | POA: Insufficient documentation

## 2015-06-03 DIAGNOSIS — Z862 Personal history of diseases of the blood and blood-forming organs and certain disorders involving the immune mechanism: Secondary | ICD-10-CM | POA: Insufficient documentation

## 2015-06-03 DIAGNOSIS — Y998 Other external cause status: Secondary | ICD-10-CM | POA: Insufficient documentation

## 2015-06-03 DIAGNOSIS — S060X0A Concussion without loss of consciousness, initial encounter: Secondary | ICD-10-CM | POA: Insufficient documentation

## 2015-06-03 DIAGNOSIS — Y9289 Other specified places as the place of occurrence of the external cause: Secondary | ICD-10-CM | POA: Insufficient documentation

## 2015-06-03 DIAGNOSIS — Z720 Tobacco use: Secondary | ICD-10-CM | POA: Insufficient documentation

## 2015-06-03 DIAGNOSIS — R6884 Jaw pain: Secondary | ICD-10-CM
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
## 2015-06-03 NOTE — Discharge Instructions (Signed)
Concussion, Adult A concussion, or closed-head injury, is a brain injury caused by a direct blow to the head or by a quick and sudden movement (jolt) of the head or neck. Concussions are usually not life-threatening. Even so, the effects of a concussion can be serious. If you have had a concussion before, you are more likely to experience concussion-like symptoms after a direct blow to the head.  CAUSES  Direct blow to the head, such as from running into another player during a soccer game, being hit in a fight, or hitting your head on a hard surface.  A jolt of the head or neck that causes the brain to move back and forth inside the skull, such as in a car crash. SIGNS AND SYMPTOMS The signs of a concussion can be hard to notice. Early on, they may be missed by you, family members, and health care providers. You may look fine but act or feel differently. Symptoms are usually temporary, but they may last for days, weeks, or even longer. Some symptoms may appear right away while others may not show up for hours or days. Every head injury is different. Symptoms include:  Mild to moderate headaches that will not go away.  A feeling of pressure inside your head.  Having more trouble than usual:  Learning or remembering things you have heard.  Answering questions.  Paying attention or concentrating.  Organizing daily tasks.  Making decisions and solving problems.  Slowness in thinking, acting or reacting, speaking, or reading.  Getting lost or being easily confused.  Feeling tired all the time or lacking energy (fatigued).  Feeling drowsy.  Sleep disturbances.  Sleeping more than usual.  Sleeping less than usual.  Trouble falling asleep.  Trouble sleeping (insomnia).  Loss of balance or feeling lightheaded or dizzy.  Nausea or vomiting.  Numbness or tingling.  Increased sensitivity to:  Sounds.  Lights.  Distractions.  Vision problems or eyes that tire  easily.  Diminished sense of taste or smell.  Ringing in the ears.  Mood changes such as feeling sad or anxious.  Becoming easily irritated or angry for little or no reason.  Lack of motivation.  Seeing or hearing things other people do not see or hear (hallucinations). DIAGNOSIS Your health care provider can usually diagnose a concussion based on a description of your injury and symptoms. He or she will ask whether you passed out (lost consciousness) and whether you are having trouble remembering events that happened right before and during your injury. Your evaluation might include:  A brain scan to look for signs of injury to the brain. Even if the test shows no injury, you may still have a concussion.  Blood tests to be sure other problems are not present. TREATMENT  Concussions are usually treated in an emergency department, in urgent care, or at a clinic. You may need to stay in the hospital overnight for further treatment.  Tell your health care provider if you are taking any medicines, including prescription medicines, over-the-counter medicines, and natural remedies. Some medicines, such as blood thinners (anticoagulants) and aspirin, may increase the chance of complications. Also tell your health care provider whether you have had alcohol or are taking illegal drugs. This information may affect treatment.  Your health care provider will send you home with important instructions to follow.  How fast you will recover from a concussion depends on many factors. These factors include how severe your concussion is, what part of your brain was injured,  your age, and how healthy you were before the concussion.  Most people with mild injuries recover fully. Recovery can take time. In general, recovery is slower in older persons. Also, persons who have had a concussion in the past or have other medical problems may find that it takes longer to recover from their current injury. HOME  CARE INSTRUCTIONS General Instructions  Carefully follow the directions your health care provider gave you.  Only take over-the-counter or prescription medicines for pain, discomfort, or fever as directed by your health care provider.  Take only those medicines that your health care provider has approved.  Do not drink alcohol until your health care provider says you are well enough to do so. Alcohol and certain other drugs may slow your recovery and can put you at risk of further injury.  If it is harder than usual to remember things, write them down.  If you are easily distracted, try to do one thing at a time. For example, do not try to watch TV while fixing dinner.  Talk with family members or close friends when making important decisions.  Keep all follow-up appointments. Repeated evaluation of your symptoms is recommended for your recovery.  Watch your symptoms and tell others to do the same. Complications sometimes occur after a concussion. Older adults with a brain injury may have a higher risk of serious complications, such as a blood clot on the brain.  Tell your teachers, school nurse, school counselor, coach, athletic trainer, or work Freight forwarder about your injury, symptoms, and restrictions. Tell them about what you can or cannot do. They should watch for:  Increased problems with attention or concentration.  Increased difficulty remembering or learning new information.  Increased time needed to complete tasks or assignments.  Increased irritability or decreased ability to cope with stress.  Increased symptoms.  Rest. Rest helps the brain to heal. Make sure you:  Get plenty of sleep at night. Avoid staying up late at night.  Keep the same bedtime hours on weekends and weekdays.  Rest during the day. Take daytime naps or rest breaks when you feel tired.  Limit activities that require a lot of thought or concentration. These include:  Doing homework or job-related  work.  Watching TV.  Working on the computer.  Avoid any situation where there is potential for another head injury (football, hockey, soccer, basketball, martial arts, downhill snow sports and horseback riding). Your condition will get worse every time you experience a concussion. You should avoid these activities until you are evaluated by the appropriate follow-up health care providers. Returning To Your Regular Activities You will need to return to your normal activities slowly, not all at once. You must give your body and brain enough time for recovery.  Do not return to sports or other athletic activities until your health care provider tells you it is safe to do so.  Ask your health care provider when you can drive, ride a bicycle, or operate heavy machinery. Your ability to react may be slower after a brain injury. Never do these activities if you are dizzy.  Ask your health care provider about when you can return to work or school. Preventing Another Concussion It is very important to avoid another brain injury, especially before you have recovered. In rare cases, another injury can lead to permanent brain damage, brain swelling, or death. The risk of this is greatest during the first 7-10 days after a head injury. Avoid injuries by:  Wearing a  your health care provider when you can drive, ride a bicycle, or operate heavy machinery. Your ability to react may be slower after a brain injury. Never do these activities if you are dizzy.   Ask your health care provider about when you can return to work or school.  Preventing Another Concussion  It is very important to avoid another brain injury, especially before you have recovered. In rare cases, another injury can lead to permanent brain damage, brain swelling, or death. The risk of this is greatest during the first 7-10 days after a head injury. Avoid injuries by:   Wearing a seat belt when riding in a car.   Drinking alcohol only in moderation.   Wearing a helmet when biking, skiing, skateboarding, skating, or doing similar activities.   Avoiding activities that could lead to a second concussion, such as contact or recreational sports, until your health care provider says it is okay.   Taking safety measures in your home.    Remove clutter and tripping hazards from floors and stairways.    Use grab bars in bathrooms and handrails by stairs.    Place non-slip mats on floors and in bathtubs.    Improve lighting in dim areas.  SEEK MEDICAL CARE IF:   You have increased problems paying attention or  concentrating.   You have increased difficulty remembering or learning new information.   You need more time to complete tasks or assignments than before.   You have increased irritability or decreased ability to cope with stress.   You have more symptoms than before.  Seek medical care if you have any of the following symptoms for more than 2 weeks after your injury:   Lasting (chronic) headaches.   Dizziness or balance problems.   Nausea.   Vision problems.   Increased sensitivity to noise or light.   Depression or mood swings.   Anxiety or irritability.   Memory problems.   Difficulty concentrating or paying attention.   Sleep problems.   Feeling tired all the time.  SEEK IMMEDIATE MEDICAL CARE IF:   You have severe or worsening headaches. These may be a sign of a blood clot in the brain.   You have weakness (even if only in one hand, leg, or part of the face).   You have numbness.   You have decreased coordination.   You vomit repeatedly.   You have increased sleepiness.   One pupil is larger than the other.   You have convulsions.   You have slurred speech.   You have increased confusion. This may be a sign of a blood clot in the brain.   You have increased restlessness, agitation, or irritability.   You are unable to recognize people or places.   You have neck pain.   It is difficult to wake you up.   You have unusual behavior changes.   You lose consciousness.  MAKE SURE YOU:   Understand these instructions.   Will watch your condition.   Will get help right away if you are not doing well or get worse.     This information is not intended to replace advice given to you by your health care provider. Make sure you discuss any questions you have with your health care provider.     Document Released: 10/09/2003 Document Revised: 08/09/2014 Document Reviewed: 02/08/2013  Elsevier Interactive Patient Education 2016 [employer]  Head Injury, Adult  You have received a head  injury. It does not   in the brain that collects, clots, and forms a bump (hematoma). WHAT CAUSES A HEAD INJURY? A serious head injury is most likely to happen to someone who is in a car wreck and is not wearing a seat belt. Other causes of major head injuries include bicycle or motorcycle accidents, sports injuries, and falls. HOW ARE HEAD INJURIES DIAGNOSED? A complete history of the event leading to the injury and your current symptoms will be helpful in diagnosing head injuries. Many times, pictures of the brain, such as CT or MRI are needed to see the extent of the injury. Often, an overnight hospital stay is necessary for observation.  WHEN SHOULD I SEEK IMMEDIATE MEDICAL CARE?  You should get help right away if:  You have confusion or drowsiness.  You feel sick to your stomach (nauseous) or have continued, forceful vomiting.  You have dizziness or unsteadiness that is getting worse.  You have severe, continued headaches not relieved by medicine. Only take over-the-counter or  prescription medicines for pain, fever, or discomfort as directed by your health care provider.  You do not have normal function of the arms or legs or are unable to walk.  You notice changes in the black spots in the center of the colored part of your eye (pupil).  You have a clear or bloody fluid coming from your nose or ears.  You have a loss of vision. During the next 24 hours after the injury, you must stay with someone who can watch you for the warning signs. This person should contact local emergency services (911 in the [place] if you have seizures, you become unconscious, or you are unable to wake up. HOW CAN I PREVENT A HEAD INJURY IN THE FUTURE? The most important factor for preventing major head injuries is avoiding motor vehicle accidents. To minimize the potential for damage to your head, it is crucial to wear seat belts while riding in motor vehicles. Wearing helmets while bike riding and playing collision sports (like football) is also helpful. Also, avoiding dangerous activities around the house will further help reduce your risk of head injury.  WHEN CAN I RETURN TO NORMAL ACTIVITIES AND ATHLETICS? You should be reevaluated by your health care provider before returning to these activities. If you have any of the following symptoms, you should not return to activities or contact sports until 1 week after the symptoms have stopped:  Persistent headache.  Dizziness or vertigo.  Poor attention and concentration.  Confusion.  Memory problems.  Nausea or vomiting.  Fatigue or tire easily.  Irritability.  Intolerant of bright lights or loud noises.  Anxiety or depression.  Disturbed sleep. MAKE SURE YOU:   Understand these instructions.  Will watch your condition.  Will get help right away if you are not doing well or get worse.   This information is not intended to replace advice given to you by your health care provider. Make sure you discuss any questions you  have with your health care provider.   Document Released: 07/19/2005 Document Revised: 08/09/2014 Document Reviewed: 03/26/2013 Elsevier Interactive Patient Education 2016 [employer] Inc[employer] Jaw Contusion A jaw contusion is a deep bruise of the jaw. Contusions are the result of an injury to muscles and tissue under the skin, which causes bleeding under the skin. The contusion may turn blue, purple, or yellow. Minor injuries will cause a painless contusion, but more severe contusions may stay painful and swollen for a few weeks. CAUSES This condition is usually caused by trauma,  direct force, or a hard hit (blow) to the jaw. SYMPTOMS Symptoms of this condition include:  Jaw pain.  Jaw swelling.  Jaw bruising, redness, or discoloration.  Jaw tenderness or soreness. DIAGNOSIS This condition is diagnosed with a medical history and a physical exam. An X-ray, CT scan, or MRI may be needed to determine if there were any associated injuries, such as broken bones (fractures). TREATMENT Often, the best treatment for this condition is applying cold compresses to the injured area and eating a soft diet. Over-the-counter medicines may also be recommended for pain control. HOME CARE INSTRUCTIONS Diet  Eat soft foods as told by your health care provider. Soft foods include baby food, gelatin, oatmeal, ice cream, applesauce, bananas, eggs, pasta, cottage cheese, soups, and yogurt.  Cut food into smaller pieces. This makes it easier to chew.  Avoid chewing gum or ice. General Instructions  If directed, apply ice to the injured area:  Put ice in a plastic bag.  Place a towel between your skin and the bag.  Leave the ice on for 20 minutes, 2-3 times per day.  Take over-the-counter and prescription medicines only as told by your health care provider.  Avoid opening your mouth widely. This includes opening your mouth to eat large pieces of food or to yawn, scream, yell, or sing.  Keep all  follow-up visits as told by your health care provider. This is important. SEEK MEDICAL CARE IF:  Your pain is not controlled with medicine.  Your symptoms do not improve with treatment or they get worse.  You have new symptoms. SEEK IMMEDIATE MEDICAL CARE IF:  You have any new cracking or clicking in your jaw.  You have trouble eating or you cannot eat.   This information is not intended to replace advice given to you by your health care provider. Make sure you discuss any questions you have with your health care provider.   Document Released: 10/09/2003 Document Revised: 04/09/2015 Document Reviewed: 10/14/2014 Elsevier Interactive Patient Education [employer] Inc[employer]
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
## 2015-07-09 IMAGING — US US OB DETAIL+14 WK
1 series · 12 of 28 positions shown · non-contrast
Comparison: none

[Series 1: us ob detail +14 wk · 81 acquisitions, 12 frames shown]
[im 3/81]
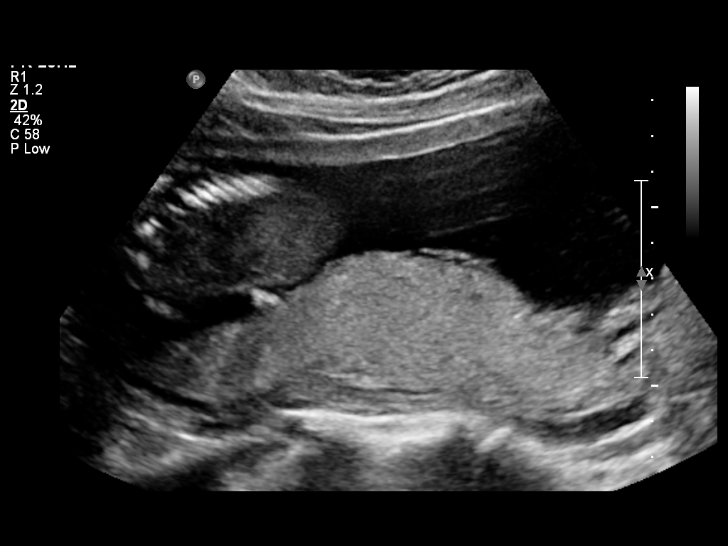
[im 9/81]
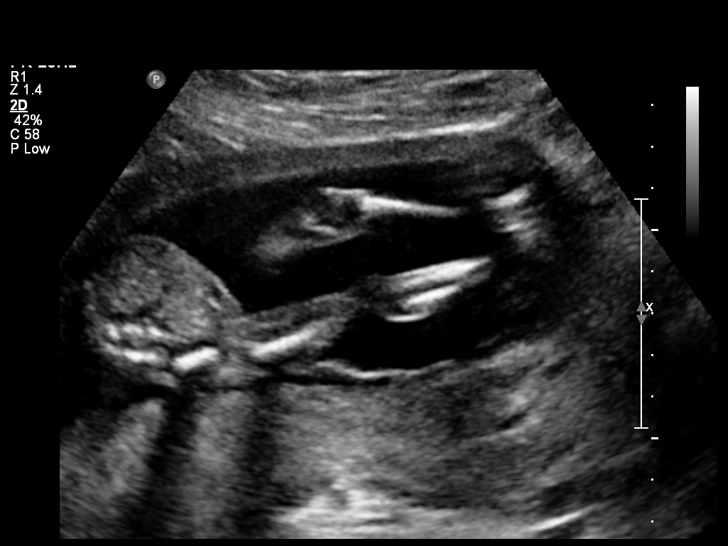
[im 15/81]
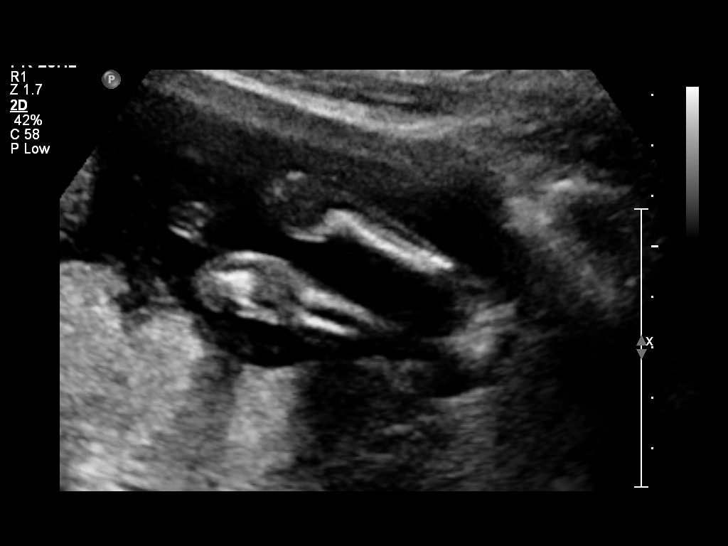
[im 24/81]
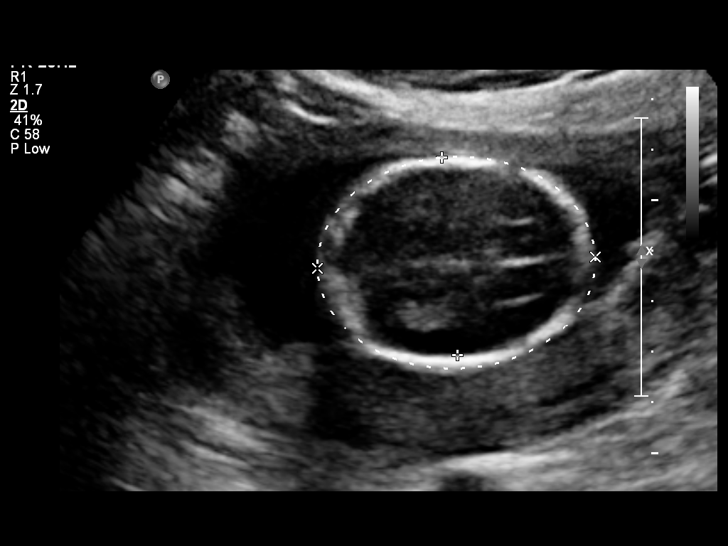
[im 30/81]
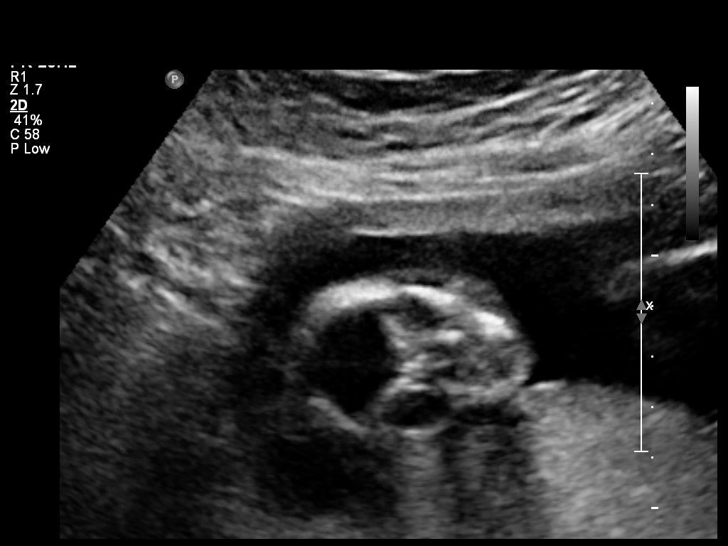
[im 36/81]
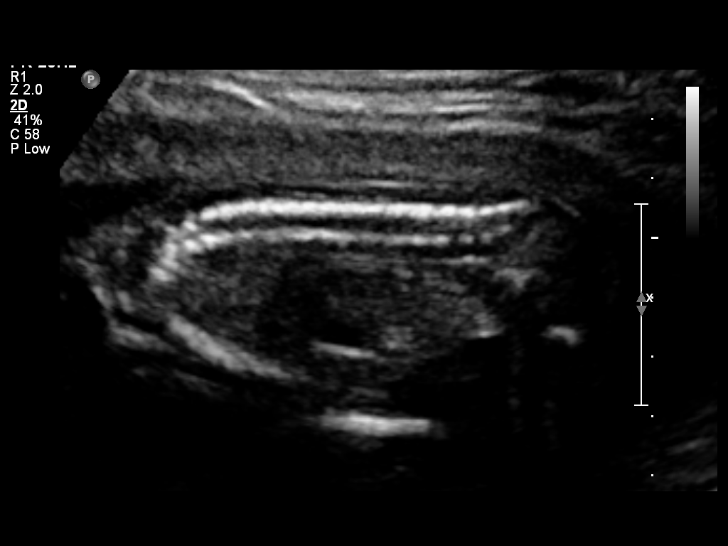
[im 45/81]
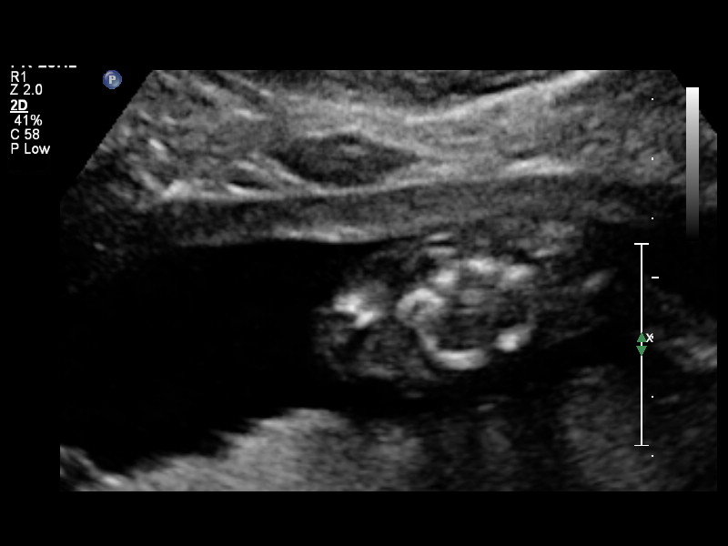
[im 51/81]
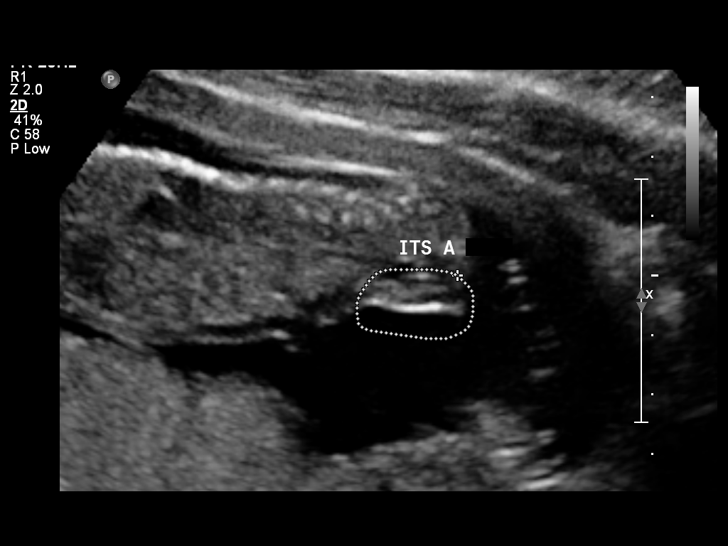
[im 57/81]
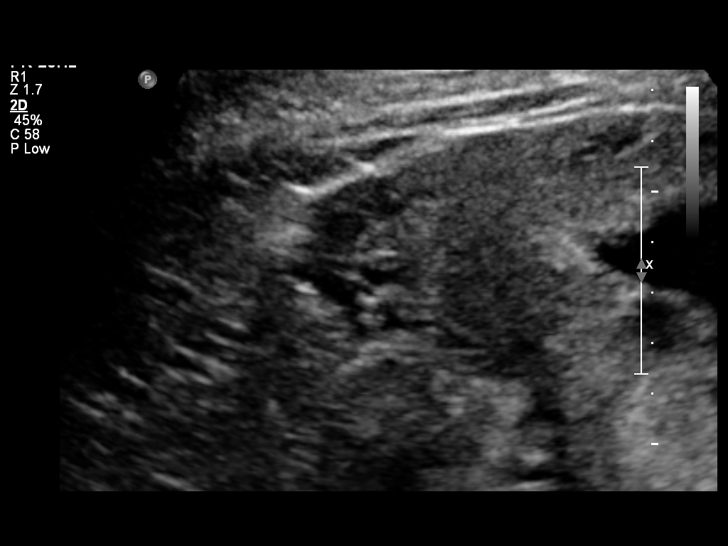
[im 66/81]
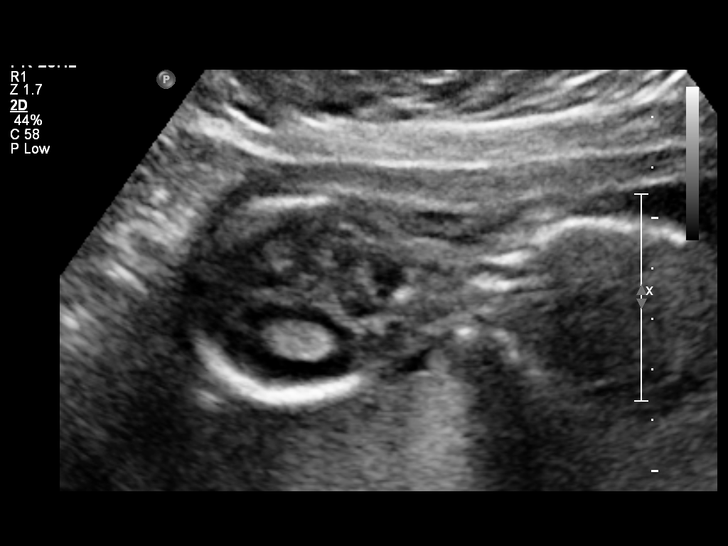
[im 72/81]
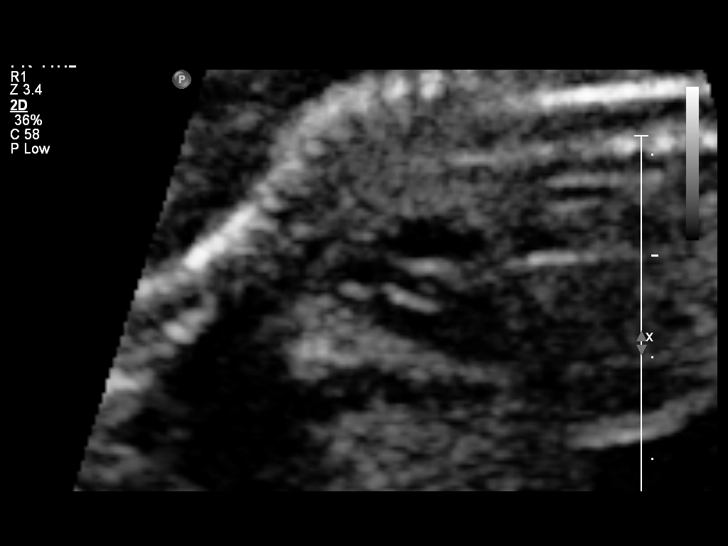
[im 78/81]
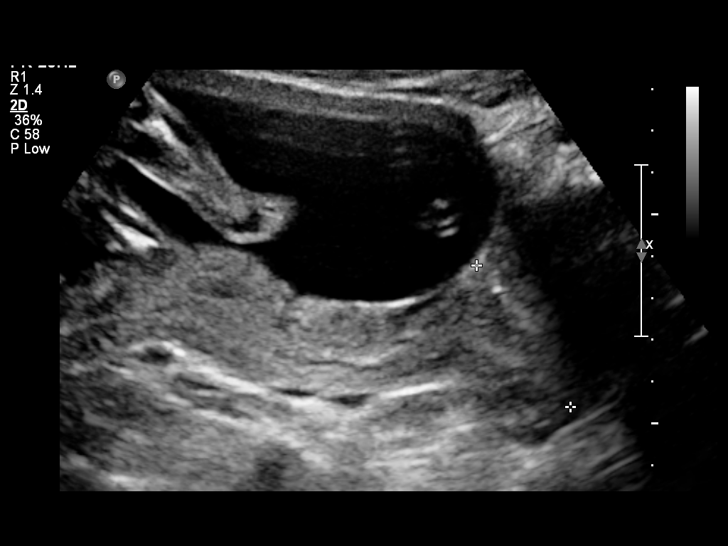

[12 of 28 positions shown; findings below may reference images not displayed]

OBSTETRICS REPORT
                      (Signed Final 02/21/2013 [DATE])

Service(s) Provided

 US OB DETAIL + 14 WK                                  76811.0
Indications

 Detailed fetal anatomic survey
 Hepatitis B carrier
 Anemia
Fetal Evaluation

 Num Of Fetuses:    1
 Fetal Heart Rate:  150                         bpm
 Cardiac Activity:  Observed
 Presentation:      Variable
 Placenta:          Posterior, above cervical
                    os
 P. Cord            Visualized, central
 Insertion:

 Amniotic Fluid
 AFI FV:      Subjectively within normal limits
                                             Larg Pckt:       4  cm
Biometry

 BPD:     39.7  mm    G. Age:   18w 0d                CI:        69.59   70 - 86
                                                      FL/HC:      18.7   16.1 -

 HC:     151.9  mm    G. Age:   18w 1d       25  %    HC/AC:      1.17   1.09 -

 AC:       130  mm    G. Age:   18w 4d       46  %    FL/BPD:
 FL:      28.4  mm    G. Age:   18w 5d       49  %    FL/AC:      21.8   20 - 24
 HUM:     27.6  mm    G. Age:   18w 6d       60  %
 CER:     18.4  mm    G. Age:   18w 1d       37  %
 NFT:     3.64  mm

 Est. FW:     246  gm      0 lb 9 oz     46  %
Gestational Age

 LMP:           12w 6d       Date:   11/23/12                 EDD:   08/30/13
 U/S Today:     18w 2d                                        EDD:   07/23/13
 Best:          18w 4d    Det. By:   U/S C R L (01/10/13)     EDD:   07/21/13
2nd Trimester Genetic Sonogram - Trisomy 21 Screening

 Age:                                             22          Risk=1:   885

 Structural anomalies (inc. cardiac):             No
 Echogenic bowel:                                 No
 Pyelectasis:                                     No
 2-vessel umbilical cord:                         No
Anatomy

 Cranium:          Appears normal         Aortic Arch:      Not well visualized
 Fetal Cavum:      Appears normal         Ductal Arch:      Appears normal
 Ventricles:       Appears normal         Diaphragm:        Appears normal
 Choroid Plexus:   Appears normal         Stomach:          Appears normal
 Cerebellum:       Appears normal         Abdomen:          Appears normal
 Posterior Fossa:  Appears normal         Abdominal Wall:   Appears nml (cord
                                                            insert, abd wall)
 Nuchal Fold:      Appears normal         Cord Vessels:     Appears normal (3
                                                            vessel cord)
 Face:             Appears normal         Kidneys:          Appear normal
                   (orbits and profile)
 Lips:             Appears normal         Bladder:          Appears normal
 Heart:            Not well visualized    Spine:            Appears normal
 RVOT:             Not well visualized    Lower             Appears normal
                                          Extremities:
 LVOT:             Not well visualized    Upper             Appears normal
                                          Extremities:

 Other:  Nasal bone visualized. Heels visualized. Female gender. Technically
         difficult due to fetal position.
Cervix Uterus Adnexa

 Cervix:       Normal appearance by transabdominal scan.

 Left Ovary:   Within normal limits.
 Right Ovary:  Within normal limits.
Impression

 Assigned GA is currently 18w 4d by early US.   Appropriate
 interval fetal growth.
 Suboptimal visualization of fetal anatomy, however no fetal
 anomalies identified.  Fetal heart and outflow tracts could not
 be visualized due to fetal position.
 No sonographic markers for aneuploidy visualized.
 Normal amniotic fluid volume. Normal cervical length.
Recommendations

 Consider follow-up ultrasound to complete fetal anatomic
 evaluation in 3-4 weeks.

 questions or concerns.

## 2015-08-03 NOTE — L&D Delivery Note (Signed)
25 y.o. [id number] at 3536w5d delivered a viable female infant in cephalic, ROA position. No nuchal cord. Left anterior shoulder delivered with ease. 60 sec delayed cord clamping. Cord clamped x2 and cut. Placenta delivered spontaneously intact, with 3VC. Fundus firm on exam with massage and pitocin. Good hemostasis noted.  Laceration: None Suture: N/A Good hemostasis noted.  Mom and baby recovering in LDR.    Apgars: pending Weight: pending  Delivery performed by Nettie ElmMichael Ervin, MD.  Documentation by: Jen MowElizabeth Kerline Trahan, DO [facility] Fellow [facility] 03/19/2016, 1:02 PM

## 2015-09-02 IMAGING — US US OB FOLLOW-UP
2 series · 12 of 28 positions shown · non-contrast
Comparison: none

[Series 1: us ob follow up · 1 of 5 slices shown (1 of 2)]
[im 3/5]
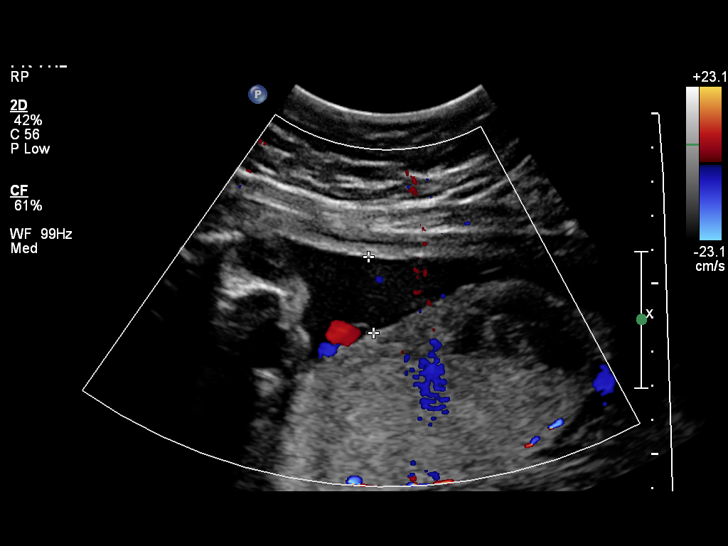

[Series 1: us ob follow up · 11 of 41 slices shown (2 of 2)]
[im 1/41]
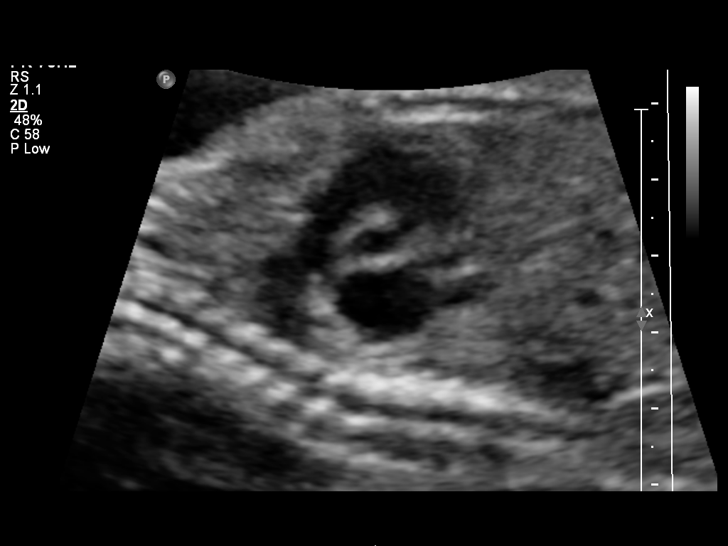
[im 4/41]
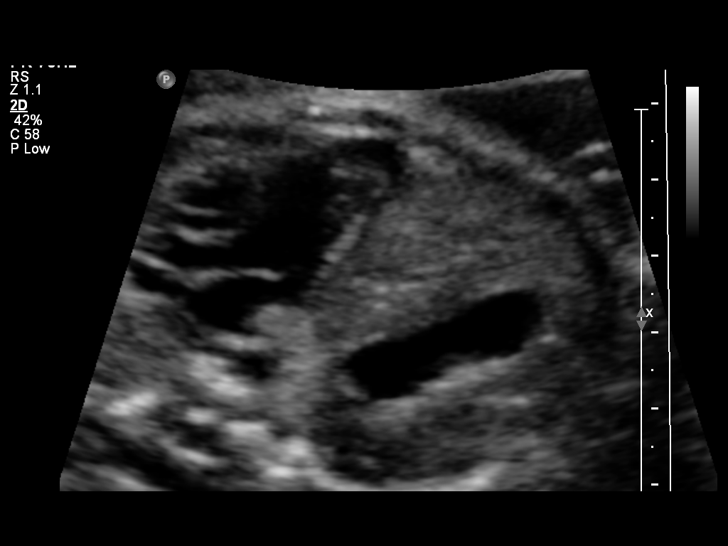
[im 9/41]
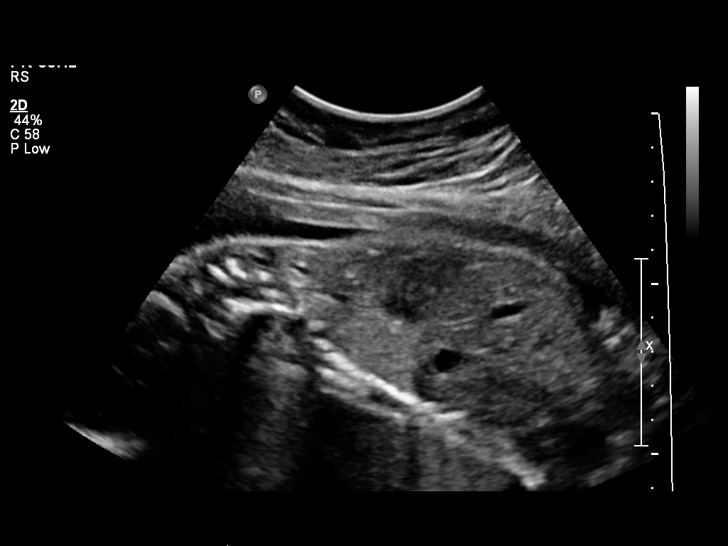
[im 12/41]
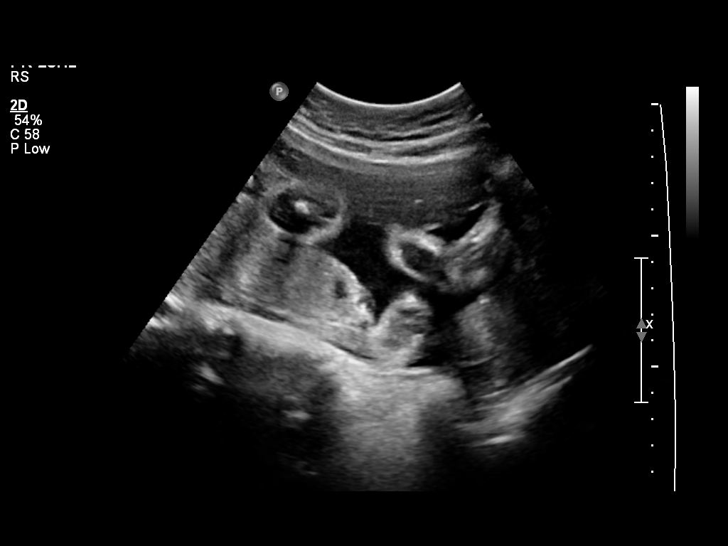
[im 16/41]
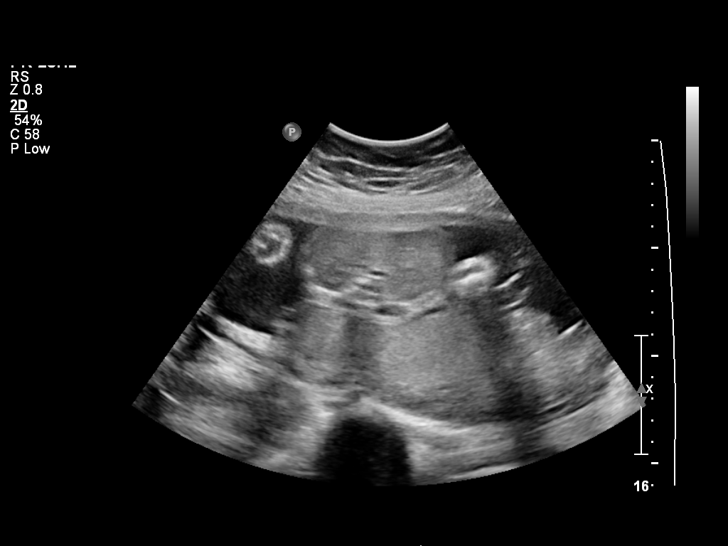
[im 21/41]
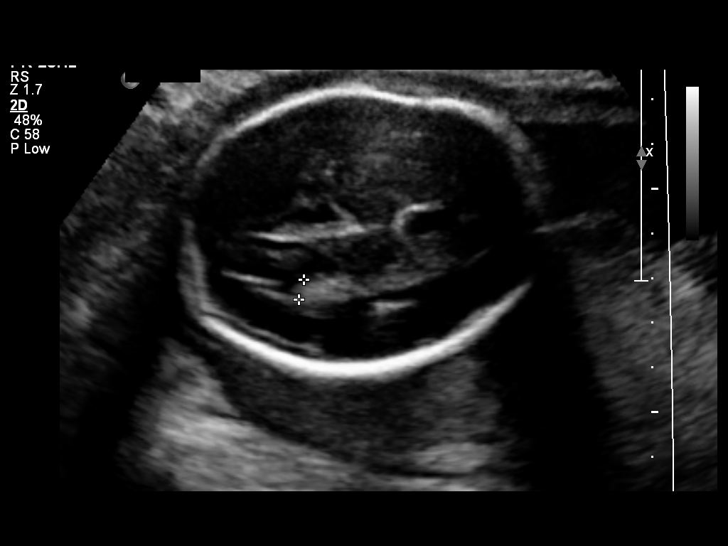
[im 24/41]
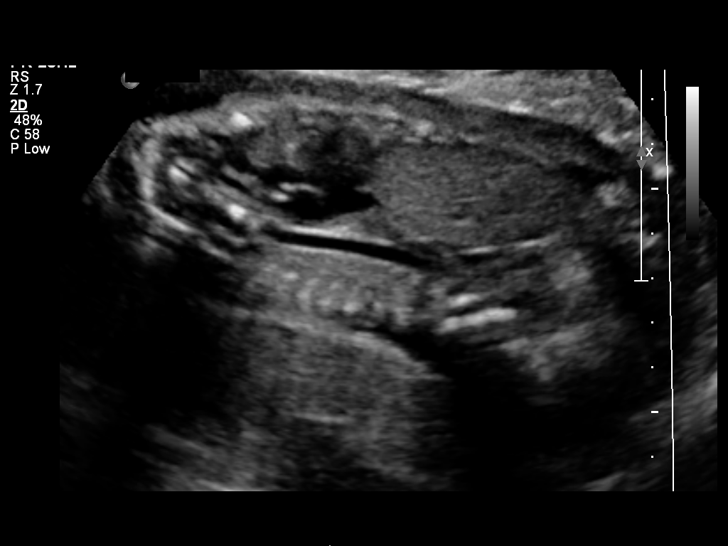
[im 27/41]
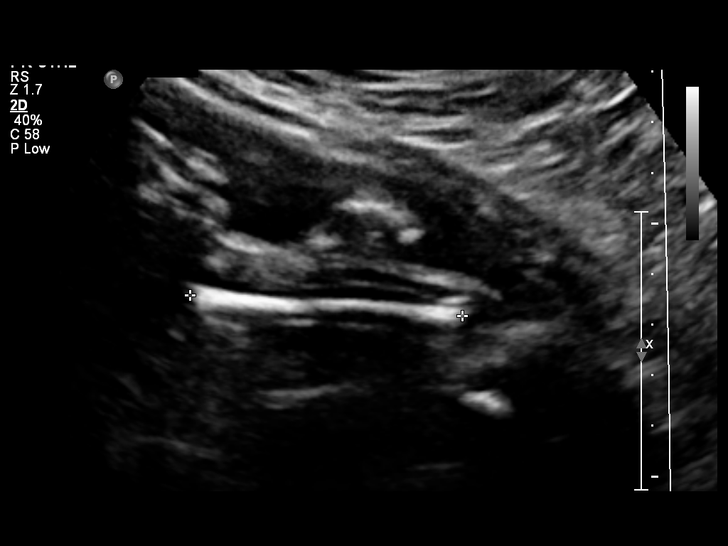
[im 32/41]
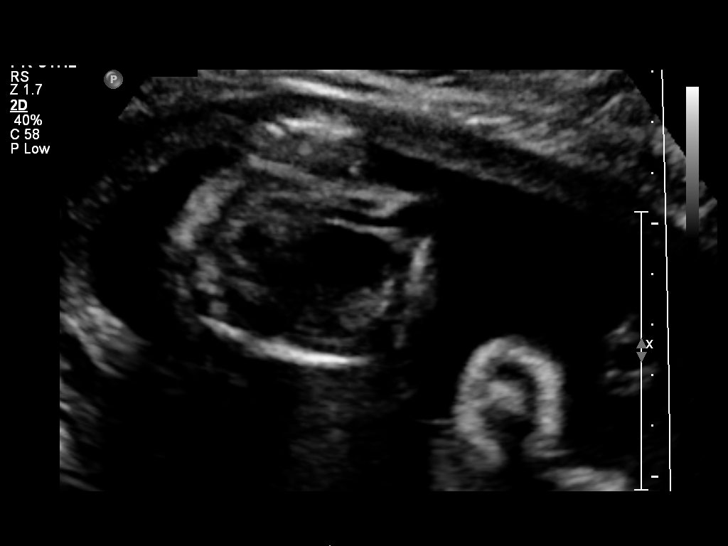
[im 36/41]
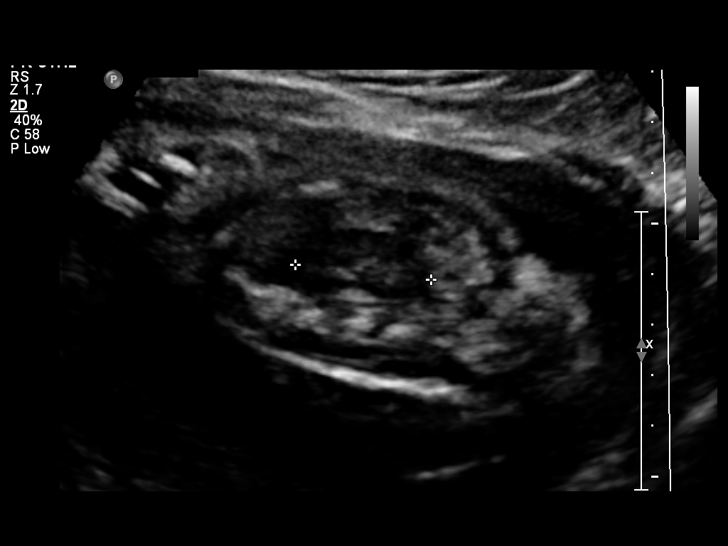
[im 39/41]
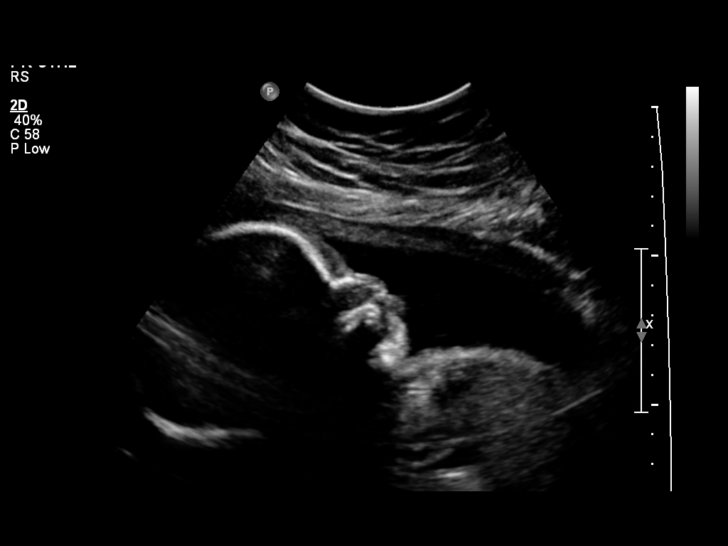

[12 of 28 positions shown; findings below may reference images not displayed]

OBSTETRICS REPORT
                      (Signed Final 04/17/2013 [DATE])

Service(s) Provided

 US OB FOLLOW UP                                       76816.1
Indications

 Follow-up incomplete fetal anatomic evaluation
 Hepatitis B carrier
 Anemia
Fetal Evaluation

 Num Of Fetuses:    1
 Fetal Heart Rate:  152                          bpm
 Cardiac Activity:  Observed
 Presentation:      Cephalic
 Placenta:          Posterior, above cervical
                    os
 P. Cord            Visualized, central
 Insertion:

 Amniotic Fluid
 AFI FV:      Subjectively within normal limits
 AFI Sum:     14.18   cm       46  %Tile     Larg Pckt:     4.4  cm
 RUQ:   3.37    cm   RLQ:    4.18   cm    LUQ:   4.4     cm   LLQ:    2.23   cm
Biometry

 BPD:     61.1  mm     G. Age:  24w 6d                CI:        68.93   70 - 86
                                                      FL/HC:      22.0   18.6 -

 HC:     235.1  mm     G. Age:  25w 4d        7  %    HC/AC:      1.15   1.04 -

 AC:     205.1  mm     G. Age:  25w 1d       10  %    FL/BPD:     84.6   71 - 87
 FL:      51.7  mm     G. Age:  27w 4d       71  %    FL/AC:      25.2   20 - 24

 Est. FW:     884  gm    1 lb 15 oz      45  %
Gestational Age

 LMP:           20w 5d        Date:  11/23/12                 EDD:   08/30/13
 U/S Today:     25w 5d                                        EDD:   07/26/13
 Best:          26w 3d     Det. By:  U/S C R L (01/10/13)     EDD:   07/21/13
Anatomy
 Cranium:          Appears normal         Aortic Arch:      Appears normal
 Fetal Cavum:      Previously seen        Ductal Arch:      Appears normal
 Ventricles:       Appears normal         Diaphragm:        Previously seen
 Choroid Plexus:   Previously seen        Stomach:          Appears normal
 Cerebellum:       Previously seen        Abdomen:          Appears normal
 Posterior Fossa:  Previously seen        Abdominal Wall:   Previously seen
 Nuchal Fold:      Previously seen        Cord Vessels:     Previously seen
 Face:             Orbits and profile     Kidneys:          Appear normal
                   previously seen
 Lips:             Previously seen        Bladder:          Appears normal
 Heart:            Appears normal         Spine:            Previously seen
                   (4CH, axis, and
                   situs)
 RVOT:             Appears normal         Lower             Previously seen
                                          Extremities:
 LVOT:             Appears normal         Upper             Previously seen
                                          Extremities:

 Other:  Nasal bone previously visualized. Heels previously visualized. Female
         gender previously seen. Technically difficult due to fetal position and
         maternal habitus.
Targeted Anatomy

 Fetal Central Nervous System
 Lat. Ventricles:
Cervix Uterus Adnexa

 Cervical Length:    3.78     cm

 Cervix:       Normal appearance by transabdominal scan.
Impression

 Single IUP at 26 [DATE] weeks
 Fetal growth is appropriate (45th %tile); the AC measures at
 the 10th %tile.
 Normal interval anatomy
 Normal amniotic fluid volume
Recommendations

 Recommend follow-up ultrasound examination in 4 weeks for
 interval growth due to lagging AC.

 questions or concerns.

## 2015-09-05 ENCOUNTER — Encounter (HOSPITAL_COMMUNITY): Payer: Self-pay | Admitting: Student

## 2015-09-05 ENCOUNTER — Inpatient Hospital Stay (HOSPITAL_COMMUNITY)
Admission: AD | Admit: 2015-09-05 | Discharge: 2015-09-05 | Disposition: A | Discharge status: Home / Self Care | Payer: Medicaid Other | Source: Ambulatory Visit | Attending: Family Medicine | Admitting: Family Medicine

## 2015-09-05 DIAGNOSIS — O209 Hemorrhage in early pregnancy, unspecified: Secondary | ICD-10-CM | POA: Diagnosis not present

## 2015-09-05 DIAGNOSIS — R102 Pelvic and perineal pain: Secondary | ICD-10-CM | POA: Diagnosis present

## 2015-09-05 DIAGNOSIS — Z3A13 13 weeks gestation of pregnancy: Secondary | ICD-10-CM | POA: Diagnosis not present

## 2015-09-05 DIAGNOSIS — O99331 Smoking (tobacco) complicating pregnancy, first trimester: Secondary | ICD-10-CM | POA: Diagnosis not present

## 2015-09-05 DIAGNOSIS — F17210 Nicotine dependence, cigarettes, uncomplicated: Secondary | ICD-10-CM | POA: Insufficient documentation

## 2015-09-05 LAB — URINALYSIS, ROUTINE W REFLEX MICROSCOPIC
BILIRUBIN URINE: NEGATIVE
GLUCOSE, UA: NEGATIVE mg/dL
HGB URINE DIPSTICK: NEGATIVE
Ketones, ur: NEGATIVE mg/dL
Leukocytes, UA: NEGATIVE
Nitrite: NEGATIVE
Protein, ur: NEGATIVE mg/dL
SPECIFIC GRAVITY, URINE: 1.01 (ref 1.005–1.030)
pH: 6 (ref 5.0–8.0)

## 2015-09-05 LAB — WET PREP, GENITAL
Clue Cells Wet Prep HPF POC: NONE SEEN
Sperm: NONE SEEN
Trich, Wet Prep: NONE SEEN
Yeast Wet Prep HPF POC: NONE SEEN

## 2015-09-05 LAB — POCT PREGNANCY, URINE: Preg Test, Ur: POSITIVE — AB

## 2015-09-05 MED ORDER — PRENATAL VITAMINS 0.8 MG PO TABS: 1.0000 | ORAL_TABLET | Freq: Every day | ORAL | Status: DC

## 2015-09-05 NOTE — Discharge Instructions (Signed)
Vaginal Bleeding During Pregnancy, First Trimester °A small amount of bleeding (spotting) from the vagina is relatively common in early pregnancy. It usually stops on its own. Various things may cause bleeding or spotting in early pregnancy. Some bleeding may be related to the pregnancy, and some may not. In most cases, the bleeding is normal and is not a problem. However, bleeding can also be a sign of something serious. Be sure to tell your health care provider about any vaginal bleeding right away. °Some possible causes of vaginal bleeding during the first trimester include: °· Infection or inflammation of the cervix. °· Growths (polyps) on the cervix. °· Miscarriage or threatened miscarriage. °· Pregnancy tissue has developed outside of the uterus and in a fallopian tube (tubal pregnancy). °· Tiny cysts have developed in the uterus instead of pregnancy tissue (molar pregnancy). °HOME CARE INSTRUCTIONS  °Watch your condition for any changes. The following actions may help to lessen any discomfort you are feeling: °· Follow your health care provider's instructions for limiting your activity. If your health care provider orders bed rest, you may need to stay in bed and only get up to use the bathroom. However, your health care provider may allow you to continue light activity. °· If needed, make plans for someone to help with your regular activities and responsibilities while you are on bed rest. °· Keep track of the number of pads you use each day, how often you change pads, and how soaked (saturated) they are. Write this down. °· Do not use tampons. Do not douche. °· Do not have sexual intercourse or orgasms until approved by your health care provider. °· If you pass any tissue from your vagina, save the tissue so you can show it to your health care provider. °· Only take over-the-counter or prescription medicines as directed by your health care provider. °· Do not take aspirin because it can make you  bleed. °· Keep all follow-up appointments as directed by your health care provider. °SEEK MEDICAL CARE IF: °· You have any vaginal bleeding during any part of your pregnancy. °· You have cramps or labor pains. °· You have a fever, not controlled by medicine. °SEEK IMMEDIATE MEDICAL CARE IF:  °· You have severe cramps in your back or belly (abdomen). °· You pass large clots or tissue from your vagina. °· Your bleeding increases. °· You feel light-headed or weak, or you have fainting episodes. °· You have chills. °· You are leaking fluid or have a gush of fluid from your vagina. °· You pass out while having a bowel movement. °MAKE SURE YOU: °· Understand these instructions. °· Will watch your condition. °· Will get help right away if you are not doing well or get worse. °  °This information is not intended to replace advice given to you by your health care provider. Make sure you discuss any questions you have with your health care provider. °  °Document Released: 04/28/2005 Document Revised: 07/24/2013 Document Reviewed: 03/26/2013 °Elsevier Interactive Patient Education ©2016 Elsevier Inc[employer] ° °Pelvic Rest °Pelvic rest is sometimes recommended for women when:  °· The placenta is partially or completely covering the opening of the cervix (placenta previa). °· There is bleeding between the uterine wall and the amniotic sac in the first trimester (subchorionic hemorrhage). °· The cervix begins to open without labor starting (incompetent cervix, cervical insufficiency). °· The labor is too early (preterm labor). °HOME CARE INSTRUCTIONS °· Do not have sexual intercourse, stimulation, or an orgasm. °· Do   not use tampons, douche, or put anything in the vagina. °· Do not lift anything over 10 pounds (4.5 kg). °· Avoid strenuous activity or straining your pelvic muscles. °SEEK MEDICAL CARE IF:  °· You have any vaginal bleeding during pregnancy. Treat this as a potential emergency. °· You have cramping pain felt low in the  stomach (stronger than menstrual cramps). °· You notice vaginal discharge (watery, mucus, or bloody). °· You have a low, dull backache. °· There are regular contractions or uterine tightening. °SEEK IMMEDIATE MEDICAL CARE IF: °You have vaginal bleeding and have placenta previa.  °  °This information is not intended to replace advice given to you by your health care provider. Make sure you discuss any questions you have with your health care provider. °  °Document Released: 11/13/2010 Document Revised: 10/11/2011 Document Reviewed: 01/20/2015 °Elsevier Interactive Patient Education ©2016 Elsevier Inc. ° °
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
## 2015-09-05 NOTE — MAU Note (Addendum)
C/o cramping since mid-Jan; c/o vaginal since mid-Jan;in Dec- pt had spotting but no period spotting; had spotting a couple of days ago; LNMP was November 4; UPT was positive; stopped taking her BC pills in September;

## 2015-09-08 LAB — GC/CHLAMYDIA PROBE AMP (~~LOC~~) NOT AT ARMC
CHLAMYDIA, DNA PROBE: NEGATIVE
Neisseria Gonorrhea: NEGATIVE

## 2015-09-09 ENCOUNTER — Telehealth: Payer: Self-pay

## 2015-09-09 NOTE — Telephone Encounter (Signed)
Received a staff message from Wynelle Bourgeois, CNM to call patient to set up a new OB appts, called patient, no answer, left message instructing the patient to return my call here at the office

## 2015-09-10 ENCOUNTER — Ambulatory Visit (INDEPENDENT_AMBULATORY_CARE_PROVIDER_SITE_OTHER): Payer: Medicaid Other | Admitting: Certified Nurse Midwife

## 2015-09-10 ENCOUNTER — Other Ambulatory Visit (HOSPITAL_COMMUNITY)
Admission: RE | Admit: 2015-09-10 | Discharge: 2015-09-10 | Disposition: A | Discharge status: Home / Self Care | Payer: Medicaid Other | Source: Ambulatory Visit | Attending: Certified Nurse Midwife | Admitting: Certified Nurse Midwife

## 2015-09-10 ENCOUNTER — Encounter: Payer: Self-pay | Admitting: Certified Nurse Midwife

## 2015-09-10 VITALS — BP 119/84 | HR 96 | Wt 199.0 lb

## 2015-09-10 DIAGNOSIS — Z36 Encounter for antenatal screening of mother: Secondary | ICD-10-CM

## 2015-09-10 DIAGNOSIS — Z3481 Encounter for supervision of other normal pregnancy, first trimester: Secondary | ICD-10-CM | POA: Diagnosis not present

## 2015-09-10 DIAGNOSIS — B181 Chronic viral hepatitis B without delta-agent: Secondary | ICD-10-CM

## 2015-09-10 DIAGNOSIS — Z3492 Encounter for supervision of normal pregnancy, unspecified, second trimester: Secondary | ICD-10-CM

## 2015-09-10 DIAGNOSIS — Z1151 Encounter for screening for human papillomavirus (HPV): Secondary | ICD-10-CM | POA: Insufficient documentation

## 2015-09-10 DIAGNOSIS — Z113 Encounter for screening for infections with a predominantly sexual mode of transmission: Secondary | ICD-10-CM | POA: Diagnosis present

## 2015-09-10 DIAGNOSIS — R87611 Atypical squamous cells cannot exclude high grade squamous intraepithelial lesion on cytologic smear of cervix (ASC-H): Secondary | ICD-10-CM | POA: Insufficient documentation

## 2015-09-10 DIAGNOSIS — Z3490 Encounter for supervision of normal pregnancy, unspecified, unspecified trimester: Secondary | ICD-10-CM | POA: Insufficient documentation

## 2015-09-10 DIAGNOSIS — Z01411 Encounter for gynecological examination (general) (routine) with abnormal findings: Secondary | ICD-10-CM | POA: Diagnosis present

## 2015-09-10 NOTE — Progress Notes (Signed)
Subjective:    Stacey Carrillo is a [id number] [redacted]w[redacted]d being seen today for her first obstetrical visit.  Her obstetrical history is significant for close interval pregnancies. Patient does intend to breast feed. Pregnancy history fully reviewed.  Patient reports no complaints.  Filed Vitals:   09/10/15 1325  BP: 119/84  Pulse: 96  Weight: 199 lb (90.266 kg)    HISTORY: OB History  Gravida Para Term Preterm AB SAB TAB Ectopic Multiple Living  0 0 0 5    # Outcome Date GA Lbr Len/2nd Weight Sex Delivery Anes PTL Lv  9 Current           8 Term 08/16/14 [redacted]w[redacted]d  7 lb 4.2 oz (3.295 kg) F Vag-Spont None  Y  7 Term 07/17/13 [redacted]w[redacted]d 01:24 / 00:02 7 lb 1.8 oz (3.225 kg) F Vag-Spont None  Y  6 Term 11/24/11 [redacted]w[redacted]d / 00:01 8 lb 9.9 oz (3.909 kg) M Vag-Spont None  Y  5 TAB 04/2010 [redacted]w[redacted]d       N  4 TAB 10/2009 [redacted]w[redacted]d       N  3 Term 02/21/08 [redacted]w[redacted]d  6 lb 7 oz (2.92 kg) F Vag-Spont EPI  Y  2 Term 04/28/06 [redacted]w[redacted]d  6 lb 2 oz (2.778 kg) M Vag-Spont Gen  Y  1 SAB              Comments: System Generated. Please review and update pregnancy details.     Past Medical History  Diagnosis Date  . Anemia   . Preterm labor   . Group B streptococcal infection   . HPV (human papilloma virus) infection   . Abnormal Pap smear   . Hepatitis B    Past Surgical History  Procedure Laterality Date  . Dilation and curettage of uterus     Family History  Problem Relation Age of Onset  . Hearing loss Brother   . Asthma Son      Exam    Uterus:     Pelvic Exam:    Perineum: No Hemorrhoids   Vulva: normal   Vagina:  normal mucosa   pH:    Cervix: small amount of bleeding after pap   Adnexa: not evaluated   Bony Pelvis: gynecoid  System: Breast:     Skin: normal coloration and turgor, no rashes    Neurologic: oriented, normal   Extremities: normal strength, tone, and muscle mass   HEENT    Mouth/Teeth    Neck supple and no masses   Cardiovascular: regular rate and rhythm   Respiratory:  appears well, vitals normal, no respiratory distress, acyanotic, normal RR, ear and throat exam is normal, neck free of mass or lymphadenopathy, chest clear, no wheezing, crepitations, rhonchi, normal symmetric air entry   Abdomen: soft, non-tender; bowel sounds normal; no masses,  no organomegaly   Urinary: urethral meatus normal      Assessment:    Pregnancy: [id number] Patient Active Problem List   Diagnosis Date Noted  . Encounter for supervision of other normal pregnancy in first trimester 09/10/2015  . Hepatitis B carrier 05/18/2011        Plan:     Initial labs drawn. Prenatal vitamins. Problem list reviewed and updated. Genetic Screening discussed First Screen: requested.  Ultrasound discussed; fetal survey: requested.  Follow up in 4 weeks. 50% of 30 min visit spent on counseling and coordination of care.    Clemmons,Lori Grissett 09/10/2015

## 2015-09-10 NOTE — Progress Notes (Signed)
Bedside ultrasound measures 12-13 week fetus with heartbeat.

## 2015-09-11 ENCOUNTER — Encounter: Payer: Self-pay | Admitting: *Deleted

## 2015-09-11 LAB — PRENATAL PROFILE (SOLSTAS)
Antibody Screen: NEGATIVE
Basophils Absolute: 0 10*3/uL (ref 0.0–0.1)
Basophils Relative: 0 % (ref 0–1)
Eosinophils Absolute: 0.1 10*3/uL (ref 0.0–0.7)
Eosinophils Relative: 1 % (ref 0–5)
HCT: 38.6 % (ref 36.0–46.0)
HIV 1&2 Ab, 4th Generation: NONREACTIVE
Hemoglobin: 12.8 g/dL (ref 12.0–15.0)
Hepatitis B Surface Ag: POSITIVE — AB
Lymphocytes Relative: 19 % (ref 12–46)
Lymphs Abs: 1.5 10*3/uL (ref 0.7–4.0)
MCH: 25.8 pg — ABNORMAL LOW (ref 26.0–34.0)
MCHC: 33.2 g/dL (ref 30.0–36.0)
MCV: 77.8 fL — ABNORMAL LOW (ref 78.0–100.0)
MPV: 11 fL (ref 8.6–12.4)
Monocytes Absolute: 0.4 10*3/uL (ref 0.1–1.0)
Monocytes Relative: 5 % (ref 3–12)
Neutro Abs: 6.1 10*3/uL (ref 1.7–7.7)
Neutrophils Relative %: 75 % (ref 43–77)
Platelets: 174 10*3/uL (ref 150–400)
RBC: 4.96 MIL/uL (ref 3.87–5.11)
RDW: 15.4 % (ref 11.5–15.5)
Rh Type: POSITIVE
Rubella: 6.45 Index — ABNORMAL HIGH (ref <0.90)
WBC: 8.1 10*3/uL (ref 4.0–10.5)

## 2015-09-11 LAB — HEPATITIS B SURF AG CONFIRMATION: Hepatitis B Surf Ag Confirmation: POSITIVE — AB

## 2015-09-12 LAB — CYTOLOGY - PAP

## 2015-09-12 LAB — CULTURE, OB URINE

## 2015-09-16 ENCOUNTER — Other Ambulatory Visit: Payer: Self-pay | Admitting: Certified Nurse Midwife

## 2015-09-16 ENCOUNTER — Ambulatory Visit (HOSPITAL_COMMUNITY)
Admission: RE | Admit: 2015-09-16 | Discharge: 2015-09-16 | Disposition: A | Discharge status: Home / Self Care | Payer: Medicaid Other | Source: Ambulatory Visit | Attending: Certified Nurse Midwife | Admitting: Certified Nurse Midwife

## 2015-09-16 ENCOUNTER — Encounter (HOSPITAL_COMMUNITY): Payer: Self-pay

## 2015-09-16 DIAGNOSIS — Z36 Encounter for antenatal screening of mother: Secondary | ICD-10-CM | POA: Insufficient documentation

## 2015-09-16 DIAGNOSIS — O98411 Viral hepatitis complicating pregnancy, first trimester: Secondary | ICD-10-CM | POA: Diagnosis not present

## 2015-09-16 DIAGNOSIS — Z3492 Encounter for supervision of normal pregnancy, unspecified, second trimester: Secondary | ICD-10-CM

## 2015-09-16 DIAGNOSIS — Z3A13 13 weeks gestation of pregnancy: Secondary | ICD-10-CM | POA: Insufficient documentation

## 2015-09-16 DIAGNOSIS — B1910 Unspecified viral hepatitis B without hepatic coma: Secondary | ICD-10-CM | POA: Insufficient documentation

## 2015-09-22 ENCOUNTER — Encounter: Payer: Self-pay | Admitting: *Deleted

## 2015-09-22 ENCOUNTER — Telehealth: Payer: Self-pay | Admitting: *Deleted

## 2015-09-22 NOTE — Telephone Encounter (Signed)
-----   Message from Rhea Pink, CNM sent at 09/21/2015  8:51 PM EST ----- Regarding: Needs colpo Needs colpo

## 2015-09-22 NOTE — Telephone Encounter (Signed)
Informed pt of abnormal pap and recommendation for Colposcopy, pt has appt on Wed 09-24-15 at 1040 for Colpo.

## 2015-09-24 ENCOUNTER — Encounter: Payer: Self-pay | Admitting: *Deleted

## 2015-09-24 ENCOUNTER — Other Ambulatory Visit (HOSPITAL_COMMUNITY): Payer: Self-pay

## 2015-09-24 ENCOUNTER — Ambulatory Visit (INDEPENDENT_AMBULATORY_CARE_PROVIDER_SITE_OTHER): Payer: Medicaid Other | Admitting: Obstetrics & Gynecology

## 2015-09-24 VITALS — BP 108/74 | HR 105 | Ht 67.0 in | Wt 199.0 lb

## 2015-09-24 DIAGNOSIS — R21 Rash and other nonspecific skin eruption: Secondary | ICD-10-CM

## 2015-09-24 DIAGNOSIS — R87611 Atypical squamous cells cannot exclude high grade squamous intraepithelial lesion on cytologic smear of cervix (ASC-H): Secondary | ICD-10-CM | POA: Diagnosis not present

## 2015-09-24 DIAGNOSIS — O3441 Maternal care for other abnormalities of cervix, first trimester: Secondary | ICD-10-CM

## 2015-09-24 DIAGNOSIS — O3440 Maternal care for other abnormalities of cervix, unspecified trimester: Secondary | ICD-10-CM

## 2015-09-24 DIAGNOSIS — R87810 Cervical high risk human papillomavirus (HPV) DNA test positive: Secondary | ICD-10-CM

## 2015-09-24 DIAGNOSIS — O3442 Maternal care for other abnormalities of cervix, second trimester: Principal | ICD-10-CM

## 2015-09-24 DIAGNOSIS — N879 Dysplasia of cervix uteri, unspecified: Secondary | ICD-10-CM | POA: Insufficient documentation

## 2015-09-24 NOTE — Patient Instructions (Signed)
Return to clinic for any obstetric concerns or go to MAU for evaluation  

## 2015-09-24 NOTE — Progress Notes (Signed)
    GYNECOLOGY CLINIC COLPOSCOPY PROCEDURE NOTE  25 y.o. [id number] at 13wd here for colposcopy for ASC cannot exclude high grade lesion [facility]) pap smear on 09/10/2015. Discussed role for HPV in cervical dysplasia, need for surveillance.  Patient given informed consent, signed copy in the chart, time out was performed.  Placed in lithotomy position. Cervix viewed with speculum and colposcope after application of acetic acid.   Colposcopy adequate? Yes A 7 mm acetowhite with mosaicism lesion seen at 12 o'clock concerning for high grade dysplasia; corresponding biopsy obtained and sent to pathology.  No ECC specimen obtained due to pregnancy.  Patient was given post procedure instructions.  Will follow up pathology and manage accordingly.   Of note, patient also reported having new onset rash on torso.  On exam, erythematous rash noted in between and underneath breasts concerning for contact/atopic dermatitis.  Recommended hydrocortisone cream as needed and to return for worsening symptoms.  Patient will continue ongoing prenatal care as scheduled.    Jaynie Collins, MD, FACOG Attending Obstetrician & Gynecologist, [facility]

## 2015-09-25 ENCOUNTER — Encounter: Payer: Self-pay | Admitting: Obstetrics & Gynecology

## 2015-09-25 ENCOUNTER — Telehealth: Payer: Self-pay | Admitting: *Deleted

## 2015-09-25 DIAGNOSIS — D069 Carcinoma in situ of cervix, unspecified: Secondary | ICD-10-CM | POA: Insufficient documentation

## 2015-09-25 NOTE — Telephone Encounter (Signed)
-----   Message from Tereso Newcomer, MD sent at 09/25/2015  3:20 PM EST ----- Patient has CIN III (severe dysplasia). She will need LEEP postpartum.  Please call to inform patient of results and recommendations.

## 2015-09-25 NOTE — Telephone Encounter (Signed)
Called pt, informed her of results and recommendation for LEEP after she delivers. Pt acknowledged and has no further questions at this time.

## 2015-10-08 ENCOUNTER — Ambulatory Visit (INDEPENDENT_AMBULATORY_CARE_PROVIDER_SITE_OTHER): Payer: Medicaid Other | Admitting: Family Medicine

## 2015-10-08 DIAGNOSIS — Z23 Encounter for immunization: Secondary | ICD-10-CM

## 2015-10-08 DIAGNOSIS — O0942 Supervision of pregnancy with grand multiparity, second trimester: Secondary | ICD-10-CM

## 2015-10-08 DIAGNOSIS — Z3482 Encounter for supervision of other normal pregnancy, second trimester: Secondary | ICD-10-CM | POA: Diagnosis not present

## 2015-10-08 DIAGNOSIS — Z36 Encounter for antenatal screening of mother: Secondary | ICD-10-CM | POA: Diagnosis not present

## 2015-10-08 DIAGNOSIS — O0940 Supervision of pregnancy with grand multiparity, unspecified trimester: Secondary | ICD-10-CM | POA: Insufficient documentation

## 2015-10-08 NOTE — Progress Notes (Signed)
Subjective:  Stacey MoselleFatima Carrillo is a 25 y.o. [phone] at 3556w3d being seen today for ongoing prenatal care.  She is currently monitored for the following issues for this low-risk pregnancy and has Hepatitis B carrier; Supervision of normal pregnancy, antepartum; Cervical dysplasia affecting pregnancy, antepartum; Severe dysplasia of cervix (CIN III); and Grand multiparity with current pregnancy, antepartum on her problem list.  Patient reports no complaints.  Contractions: Not present. Vag. Bleeding: None.  Movement: Absent. Denies leaking of fluid.   The following portions of the patient's history were reviewed and updated as appropriate: allergies, current medications, past family history, past medical history, past social history, past surgical history and problem list. Problem list updated.  Objective:   Filed Vitals:   10/08/15 1000  BP: 129/82  Pulse: 97  Weight: 195 lb (88.451 kg)    Fetal Status: Fetal Heart Rate (bpm): 143   Movement: Absent     General:  Alert, oriented and cooperative. Patient is in no acute distress.  Skin: Skin is warm and dry. No rash noted.   Cardiovascular: Normal heart rate noted  Respiratory: Normal respiratory effort, no problems with respiration noted  Abdomen: Soft, gravid, appropriate for gestational age. Pain/Pressure: Present     Pelvic: Vag. Bleeding: None     Cervical exam deferred        Extremities: Normal range of motion.  Edema: None  Mental Status: Normal mood and affect. Normal behavior. Normal judgment and thought content.   Urinalysis: Urine Protein: Trace Urine Glucose: Negative  Assessment and Plan:  Pregnancy: [id number] at 7356w3d  1. Supervision of normal pregnancy, antepartum, second trimester Continue routine prenatal care. - Alpha fetoprotein, maternal - US MFM OB COMP + 14 WK; Future - Flu Vaccine QUAD 36+ mos IM; Standing - Flu Vaccine QUAD 36+ mos IM  2. Grand multiparity with current pregnancy, antepartum, second trimester At  risk of PPH  General obstetric precautions and fetal movement were reviewed in detail with the patient. Please refer to After Visit Summary for other counseling recommendations.  Return in 4 weeks (on 11/05/2015).   Reva Boresanya S Pratt, MD

## 2015-10-08 NOTE — Patient Instructions (Signed)
Breastfeeding Deciding to breastfeed is one of the best choices you can make for you and your baby. A change in hormones during pregnancy causes your breast tissue to grow and increases the number and size of your milk ducts. These hormones also allow proteins, sugars, and fats from your blood supply to make breast milk in your milk-producing glands. Hormones prevent breast milk from being released before your baby is born as well as prompt milk flow after birth. Once breastfeeding has begun, thoughts of your baby, as well as his or her sucking or crying, can stimulate the release of milk from your milk-producing glands.  BENEFITS OF BREASTFEEDING For Your Baby  Your first milk (colostrum) helps your baby's digestive system function better.  There are antibodies in your milk that help your baby fight off infections.  Your baby has a lower incidence of asthma, allergies, and sudden infant death syndrome.  The nutrients in breast milk are better for your baby than infant formulas and are designed uniquely for your baby's needs.  Breast milk improves your baby's brain development.  Your baby is less likely to develop other conditions, such as childhood obesity, asthma, or type 2 diabetes mellitus. For You  Breastfeeding helps to create a very special bond between you and your baby.  Breastfeeding is convenient. Breast milk is always available at the correct temperature and costs nothing.  Breastfeeding helps to burn calories and helps you lose the weight gained during pregnancy.  Breastfeeding makes your uterus contract to its prepregnancy size faster and slows bleeding (lochia) after you give birth.   Breastfeeding helps to lower your risk of developing type 2 diabetes mellitus, osteoporosis, and breast or ovarian cancer later in life. SIGNS THAT YOUR BABY IS HUNGRY Early Signs of Hunger  Increased alertness or activity.  Stretching.  Movement of the head from side to  side.  Movement of the head and opening of the mouth when the corner of the mouth or cheek is stroked (rooting).  Increased sucking sounds, smacking lips, cooing, sighing, or squeaking.  Hand-to-mouth movements.  Increased sucking of fingers or hands. Late Signs of Hunger  Fussing.  Intermittent crying. Extreme Signs of Hunger Signs of extreme hunger will require calming and consoling before your baby will be able to breastfeed successfully. Do not wait for the following signs of extreme hunger to occur before you initiate breastfeeding:  Restlessness.  A loud, strong cry.  Screaming. BREASTFEEDING BASICS Breastfeeding Initiation  Find a comfortable place to sit or lie down, with your neck and back well supported.  Place a pillow or rolled up blanket under your baby to bring him or her to the level of your breast (if you are seated). Nursing pillows are specially designed to help support your arms and your baby while you breastfeed.  Make sure that your baby's abdomen is facing your abdomen.  Gently massage your breast. With your fingertips, massage from your chest wall toward your nipple in a circular motion. This encourages milk flow. You may need to continue this action during the feeding if your milk flows slowly.  Support your breast with 4 fingers underneath and your thumb above your nipple. Make sure your fingers are well away from your nipple and your baby's mouth.  Stroke your baby's lips gently with your finger or nipple.  When your baby's mouth is open wide enough, quickly bring your baby to your breast, placing your entire nipple and as much of the colored area around your nipple (  areola) as possible into your baby's mouth.  More areola should be visible above your baby's upper lip than below the lower lip.  Your baby's tongue should be between his or her lower gum and your breast.  Ensure that your baby's mouth is correctly positioned around your nipple  (latched). Your baby's lips should create a seal on your breast and be turned out (everted).  It is common for your baby to suck about 2-3 minutes in order to start the flow of breast milk. Latching Teaching your baby how to latch on to your breast properly is very important. An improper latch can cause nipple pain and decreased milk supply for you and poor weight gain in your baby. Also, if your baby is not latched onto your nipple properly, he or she may swallow some air during feeding. This can make your baby fussy. Burping your baby when you switch breasts during the feeding can help to get rid of the air. However, teaching your baby to latch on properly is still the best way to prevent fussiness from swallowing air while breastfeeding. Signs that your baby has successfully latched on to your nipple:  Silent tugging or silent sucking, without causing you pain.  Swallowing heard between every 3-4 sucks.  Muscle movement above and in front of his or her ears while sucking. Signs that your baby has not successfully latched on to nipple:  Sucking sounds or smacking sounds from your baby while breastfeeding.  Nipple pain. If you think your baby has not latched on correctly, slip your finger into the corner of your baby's mouth to break the suction and place it between your baby's gums. Attempt breastfeeding initiation again. Signs of Successful Breastfeeding Signs from your baby:  A gradual decrease in the number of sucks or complete cessation of sucking.  Falling asleep.  Relaxation of his or her body.  Retention of a small amount of milk in his or her mouth.  Letting go of your breast by himself or herself. Signs from you:  Breasts that have increased in firmness, weight, and size 1-3 hours after feeding.  Breasts that are softer immediately after breastfeeding.  Increased milk volume, as well as a change in milk consistency and color by the fifth day of breastfeeding.  Nipples  that are not sore, cracked, or bleeding. Signs That Your Baby is Getting Enough Milk  Wetting at least 3 diapers in a 24-hour period. The urine should be clear and pale yellow by age 5 days.  At least 3 stools in a 24-hour period by age 5 days. The stool should be soft and yellow.  At least 3 stools in a 24-hour period by age 7 days. The stool should be seedy and yellow.  No loss of weight greater than 10% of birth weight during the first 3 days of age.  Average weight gain of 4-7 ounces (113-198 g) per week after age 4 days.  Consistent daily weight gain by age 5 days, without weight loss after the age of 2 weeks. After a feeding, your baby may spit up a small amount. This is common. BREASTFEEDING FREQUENCY AND DURATION Frequent feeding will help you make more milk and can prevent sore nipples and breast engorgement. Breastfeed when you feel the need to reduce the fullness of your breasts or when your baby shows signs of hunger. This is called "breastfeeding on demand." Avoid introducing a pacifier to your baby while you are working to establish breastfeeding (the first 4-6 weeks   after your baby is born). After this time you may choose to use a pacifier. Research has shown that pacifier use during the first year of a baby's life decreases the risk of sudden infant death syndrome (SIDS). Allow your baby to feed on each breast as long as he or she wants. Breastfeed until your baby is finished feeding. When your baby unlatches or falls asleep while feeding from the first breast, offer the second breast. Because newborns are often sleepy in the first few weeks of life, you may need to awaken your baby to get him or her to feed. Breastfeeding times will vary from baby to baby. However, the following rules can serve as a guide to help you ensure that your baby is properly fed:  Newborns (babies 4 weeks of age or younger) may breastfeed every 1-3 hours.  Newborns should not go longer than 3 hours  during the day or 5 hours during the night without breastfeeding.  You should breastfeed your baby a minimum of 8 times in a 24-hour period until you begin to introduce solid foods to your baby at around 6 months of age. BREAST MILK PUMPING Pumping and storing breast milk allows you to ensure that your baby is exclusively fed your breast milk, even at times when you are unable to breastfeed. This is especially important if you are going back to work while you are still breastfeeding or when you are not able to be present during feedings. Your lactation consultant can give you guidelines on how long it is safe to store breast milk. A breast pump is a machine that allows you to pump milk from your breast into a sterile bottle. The pumped breast milk can then be stored in a refrigerator or freezer. Some breast pumps are operated by hand, while others use electricity. Ask your lactation consultant which type will work best for you. Breast pumps can be purchased, but some hospitals and breastfeeding support groups lease breast pumps on a monthly basis. A lactation consultant can teach you how to hand express breast milk, if you prefer not to use a pump. CARING FOR YOUR BREASTS WHILE YOU BREASTFEED Nipples can become dry, cracked, and sore while breastfeeding. The following recommendations can help keep your breasts moisturized and healthy:  Avoid using soap on your nipples.  Wear a supportive bra. Although not required, special nursing bras and tank tops are designed to allow access to your breasts for breastfeeding without taking off your entire bra or top. Avoid wearing underwire-style bras or extremely tight bras.  Air dry your nipples for 3-4minutes after each feeding.  Use only cotton bra pads to absorb leaked breast milk. Leaking of breast milk between feedings is normal.  Use lanolin on your nipples after breastfeeding. Lanolin helps to maintain your skin's normal moisture barrier. If you use  pure lanolin, you do not need to wash it off before feeding your baby again. Pure lanolin is not toxic to your baby. You may also hand express a few drops of breast milk and gently massage that milk into your nipples and allow the milk to air dry. In the first few weeks after giving birth, some women experience extremely full breasts (engorgement). Engorgement can make your breasts feel heavy, warm, and tender to the touch. Engorgement peaks within 3-5 days after you give birth. The following recommendations can help ease engorgement:  Completely empty your breasts while breastfeeding or pumping. You may want to start by applying warm, moist heat (in   the shower or with warm water-soaked hand towels) just before feeding or pumping. This increases circulation and helps the milk flow. If your baby does not completely empty your breasts while breastfeeding, pump any extra milk after he or she is finished.  Wear a snug bra (nursing or regular) or tank top for 1-2 days to signal your body to slightly decrease milk production.  Apply ice packs to your breasts, unless this is too uncomfortable for you.  Make sure that your baby is latched on and positioned properly while breastfeeding. If engorgement persists after 48 hours of following these recommendations, contact your health care provider or a lactation consultant. OVERALL HEALTH CARE RECOMMENDATIONS WHILE BREASTFEEDING  Eat healthy foods. Alternate between meals and snacks, eating 3 of each per day. Because what you eat affects your breast milk, some of the foods may make your baby more irritable than usual. Avoid eating these foods if you are sure that they are negatively affecting your baby.  Drink milk, fruit juice, and water to satisfy your thirst (about 10 glasses a day).  Rest often, relax, and continue to take your prenatal vitamins to prevent fatigue, stress, and anemia.  Continue breast self-awareness checks.  Avoid chewing and smoking  tobacco. Chemicals from cigarettes that pass into breast milk and exposure to secondhand smoke may harm your baby.  Avoid alcohol and drug use, including marijuana. Some medicines that may be harmful to your baby can pass through breast milk. It is important to ask your health care provider before taking any medicine, including all over-the-counter and prescription medicine as well as vitamin and herbal supplements. It is possible to become pregnant while breastfeeding. If birth control is desired, ask your health care provider about options that will be safe for your baby. SEEK MEDICAL CARE IF:  You feel like you want to stop breastfeeding or have become frustrated with breastfeeding.  You have painful breasts or nipples.  Your nipples are cracked or bleeding.  Your breasts are red, tender, or warm.  You have a swollen area on either breast.  You have a fever or chills.  You have nausea or vomiting.  You have drainage other than breast milk from your nipples.  Your breasts do not become full before feedings by the fifth day after you give birth.  You feel sad and depressed.  Your baby is too sleepy to eat well.  Your baby is having trouble sleeping.   Your baby is wetting less than 3 diapers in a 24-hour period.  Your baby has less than 3 stools in a 24-hour period.  Your baby's skin or the white part of his or her eyes becomes yellow.   Your baby is not gaining weight by 5 days of age. SEEK IMMEDIATE MEDICAL CARE IF:  Your baby is overly tired (lethargic) and does not want to wake up and feed.  Your baby develops an unexplained fever.   This information is not intended to replace advice given to you by your health care provider. Make sure you discuss any questions you have with your health care provider.   Document Released: 07/19/2005 Document Revised: 04/09/2015 Document Reviewed: 01/10/2013 Elsevier Interactive Patient Education 2016 [employer]  

## 2015-10-10 LAB — ALPHA FETOPROTEIN, MATERNAL
AFP: 14.5 ng/mL
Curr Gest Age: 15.3 wks.days
MoM for AFP: 0.58
Open Spina bifida: NEGATIVE
Osb Risk: <1:27300 {titer}

## 2015-11-03 ENCOUNTER — Ambulatory Visit (HOSPITAL_COMMUNITY)
Admission: RE | Admit: 2015-11-03 | Discharge: 2015-11-03 | Disposition: A | Discharge status: Home / Self Care | Payer: Medicaid Other | Source: Ambulatory Visit | Attending: Family Medicine | Admitting: Family Medicine

## 2015-11-03 ENCOUNTER — Other Ambulatory Visit: Payer: Self-pay | Admitting: Family Medicine

## 2015-11-03 DIAGNOSIS — Z3482 Encounter for supervision of other normal pregnancy, second trimester: Secondary | ICD-10-CM

## 2015-11-03 DIAGNOSIS — O98412 Viral hepatitis complicating pregnancy, second trimester: Secondary | ICD-10-CM | POA: Diagnosis not present

## 2015-11-03 DIAGNOSIS — Z3A19 19 weeks gestation of pregnancy: Secondary | ICD-10-CM

## 2015-11-03 DIAGNOSIS — B1910 Unspecified viral hepatitis B without hepatic coma: Secondary | ICD-10-CM

## 2015-11-05 ENCOUNTER — Encounter: Payer: Medicaid Other | Admitting: Obstetrics and Gynecology

## 2015-11-11 ENCOUNTER — Ambulatory Visit (INDEPENDENT_AMBULATORY_CARE_PROVIDER_SITE_OTHER): Payer: Medicaid Other | Admitting: Family Medicine

## 2015-11-11 VITALS — BP 121/71 | HR 100 | Wt 194.0 lb

## 2015-11-11 DIAGNOSIS — Z3482 Encounter for supervision of other normal pregnancy, second trimester: Secondary | ICD-10-CM

## 2015-11-11 NOTE — Patient Instructions (Signed)
Breastfeeding Deciding to breastfeed is one of the best choices you can make for you and your baby. A change in hormones during pregnancy causes your breast tissue to grow and increases the number and size of your milk ducts. These hormones also allow proteins, sugars, and fats from your blood supply to make breast milk in your milk-producing glands. Hormones prevent breast milk from being released before your baby is born as well as prompt milk flow after birth. Once breastfeeding has begun, thoughts of your baby, as well as his or her sucking or crying, can stimulate the release of milk from your milk-producing glands.  BENEFITS OF BREASTFEEDING For Your Baby  Your first milk (colostrum) helps your baby's digestive system function better.  There are antibodies in your milk that help your baby fight off infections.  Your baby has a lower incidence of asthma, allergies, and sudden infant death syndrome.  The nutrients in breast milk are better for your baby than infant formulas and are designed uniquely for your baby's needs.  Breast milk improves your baby's brain development.  Your baby is less likely to develop other conditions, such as childhood obesity, asthma, or type 2 diabetes mellitus. For You  Breastfeeding helps to create a very special bond between you and your baby.  Breastfeeding is convenient. Breast milk is always available at the correct temperature and costs nothing.  Breastfeeding helps to burn calories and helps you lose the weight gained during pregnancy.  Breastfeeding makes your uterus contract to its prepregnancy size faster and slows bleeding (lochia) after you give birth.   Breastfeeding helps to lower your risk of developing type 2 diabetes mellitus, osteoporosis, and breast or ovarian cancer later in life. SIGNS THAT YOUR BABY IS HUNGRY Early Signs of Hunger  Increased alertness or activity.  Stretching.  Movement of the head from side to  side.  Movement of the head and opening of the mouth when the corner of the mouth or cheek is stroked (rooting).  Increased sucking sounds, smacking lips, cooing, sighing, or squeaking.  Hand-to-mouth movements.  Increased sucking of fingers or hands. Late Signs of Hunger  Fussing.  Intermittent crying. Extreme Signs of Hunger Signs of extreme hunger will require calming and consoling before your baby will be able to breastfeed successfully. Do not wait for the following signs of extreme hunger to occur before you initiate breastfeeding:  Restlessness.  A loud, strong cry.  Screaming. BREASTFEEDING BASICS Breastfeeding Initiation  Find a comfortable place to sit or lie down, with your neck and back well supported.  Place a pillow or rolled up blanket under your baby to bring him or her to the level of your breast (if you are seated). Nursing pillows are specially designed to help support your arms and your baby while you breastfeed.  Make sure that your baby's abdomen is facing your abdomen.  Gently massage your breast. With your fingertips, massage from your chest wall toward your nipple in a circular motion. This encourages milk flow. You may need to continue this action during the feeding if your milk flows slowly.  Support your breast with 4 fingers underneath and your thumb above your nipple. Make sure your fingers are well away from your nipple and your baby's mouth.  Stroke your baby's lips gently with your finger or nipple.  When your baby's mouth is open wide enough, quickly bring your baby to your breast, placing your entire nipple and as much of the colored area around your nipple (  areola) as possible into your baby's mouth.  More areola should be visible above your baby's upper lip than below the lower lip.  Your baby's tongue should be between his or her lower gum and your breast.  Ensure that your baby's mouth is correctly positioned around your nipple  (latched). Your baby's lips should create a seal on your breast and be turned out (everted).  It is common for your baby to suck about 2-3 minutes in order to start the flow of breast milk. Latching Teaching your baby how to latch on to your breast properly is very important. An improper latch can cause nipple pain and decreased milk supply for you and poor weight gain in your baby. Also, if your baby is not latched onto your nipple properly, he or she may swallow some air during feeding. This can make your baby fussy. Burping your baby when you switch breasts during the feeding can help to get rid of the air. However, teaching your baby to latch on properly is still the best way to prevent fussiness from swallowing air while breastfeeding. Signs that your baby has successfully latched on to your nipple:  Silent tugging or silent sucking, without causing you pain.  Swallowing heard between every 3-4 sucks.  Muscle movement above and in front of his or her ears while sucking. Signs that your baby has not successfully latched on to nipple:  Sucking sounds or smacking sounds from your baby while breastfeeding.  Nipple pain. If you think your baby has not latched on correctly, slip your finger into the corner of your baby's mouth to break the suction and place it between your baby's gums. Attempt breastfeeding initiation again. Signs of Successful Breastfeeding Signs from your baby:  A gradual decrease in the number of sucks or complete cessation of sucking.  Falling asleep.  Relaxation of his or her body.  Retention of a small amount of milk in his or her mouth.  Letting go of your breast by himself or herself. Signs from you:  Breasts that have increased in firmness, weight, and size 1-3 hours after feeding.  Breasts that are softer immediately after breastfeeding.  Increased milk volume, as well as a change in milk consistency and color by the fifth day of breastfeeding.  Nipples  that are not sore, cracked, or bleeding. Signs That Your Baby is Getting Enough Milk  Wetting at least 3 diapers in a 24-hour period. The urine should be clear and pale yellow by age 5 days.  At least 3 stools in a 24-hour period by age 5 days. The stool should be soft and yellow.  At least 3 stools in a 24-hour period by age 7 days. The stool should be seedy and yellow.  No loss of weight greater than 10% of birth weight during the first 3 days of age.  Average weight gain of 4-7 ounces (113-198 g) per week after age 4 days.  Consistent daily weight gain by age 5 days, without weight loss after the age of 2 weeks. After a feeding, your baby may spit up a small amount. This is common. BREASTFEEDING FREQUENCY AND DURATION Frequent feeding will help you make more milk and can prevent sore nipples and breast engorgement. Breastfeed when you feel the need to reduce the fullness of your breasts or when your baby shows signs of hunger. This is called "breastfeeding on demand." Avoid introducing a pacifier to your baby while you are working to establish breastfeeding (the first 4-6 weeks   after your baby is born). After this time you may choose to use a pacifier. Research has shown that pacifier use during the first year of a baby's life decreases the risk of sudden infant death syndrome (SIDS). Allow your baby to feed on each breast as long as he or she wants. Breastfeed until your baby is finished feeding. When your baby unlatches or falls asleep while feeding from the first breast, offer the second breast. Because newborns are often sleepy in the first few weeks of life, you may need to awaken your baby to get him or her to feed. Breastfeeding times will vary from baby to baby. However, the following rules can serve as a guide to help you ensure that your baby is properly fed:  Newborns (babies 4 weeks of age or younger) may breastfeed every 1-3 hours.  Newborns should not go longer than 3 hours  during the day or 5 hours during the night without breastfeeding.  You should breastfeed your baby a minimum of 8 times in a 24-hour period until you begin to introduce solid foods to your baby at around 6 months of age. BREAST MILK PUMPING Pumping and storing breast milk allows you to ensure that your baby is exclusively fed your breast milk, even at times when you are unable to breastfeed. This is especially important if you are going back to work while you are still breastfeeding or when you are not able to be present during feedings. Your lactation consultant can give you guidelines on how long it is safe to store breast milk. A breast pump is a machine that allows you to pump milk from your breast into a sterile bottle. The pumped breast milk can then be stored in a refrigerator or freezer. Some breast pumps are operated by hand, while others use electricity. Ask your lactation consultant which type will work best for you. Breast pumps can be purchased, but some hospitals and breastfeeding support groups lease breast pumps on a monthly basis. A lactation consultant can teach you how to hand express breast milk, if you prefer not to use a pump. CARING FOR YOUR BREASTS WHILE YOU BREASTFEED Nipples can become dry, cracked, and sore while breastfeeding. The following recommendations can help keep your breasts moisturized and healthy:  Avoid using soap on your nipples.  Wear a supportive bra. Although not required, special nursing bras and tank tops are designed to allow access to your breasts for breastfeeding without taking off your entire bra or top. Avoid wearing underwire-style bras or extremely tight bras.  Air dry your nipples for 3-4minutes after each feeding.  Use only cotton bra pads to absorb leaked breast milk. Leaking of breast milk between feedings is normal.  Use lanolin on your nipples after breastfeeding. Lanolin helps to maintain your skin's normal moisture barrier. If you use  pure lanolin, you do not need to wash it off before feeding your baby again. Pure lanolin is not toxic to your baby. You may also hand express a few drops of breast milk and gently massage that milk into your nipples and allow the milk to air dry. In the first few weeks after giving birth, some women experience extremely full breasts (engorgement). Engorgement can make your breasts feel heavy, warm, and tender to the touch. Engorgement peaks within 3-5 days after you give birth. The following recommendations can help ease engorgement:  Completely empty your breasts while breastfeeding or pumping. You may want to start by applying warm, moist heat (in   the shower or with warm water-soaked hand towels) just before feeding or pumping. This increases circulation and helps the milk flow. If your baby does not completely empty your breasts while breastfeeding, pump any extra milk after he or she is finished.  Wear a snug bra (nursing or regular) or tank top for 1-2 days to signal your body to slightly decrease milk production.  Apply ice packs to your breasts, unless this is too uncomfortable for you.  Make sure that your baby is latched on and positioned properly while breastfeeding. If engorgement persists after 48 hours of following these recommendations, contact your health care provider or a lactation consultant. OVERALL HEALTH CARE RECOMMENDATIONS WHILE BREASTFEEDING  Eat healthy foods. Alternate between meals and snacks, eating 3 of each per day. Because what you eat affects your breast milk, some of the foods may make your baby more irritable than usual. Avoid eating these foods if you are sure that they are negatively affecting your baby.  Drink milk, fruit juice, and water to satisfy your thirst (about 10 glasses a day).  Rest often, relax, and continue to take your prenatal vitamins to prevent fatigue, stress, and anemia.  Continue breast self-awareness checks.  Avoid chewing and smoking  tobacco. Chemicals from cigarettes that pass into breast milk and exposure to secondhand smoke may harm your baby.  Avoid alcohol and drug use, including marijuana. Some medicines that may be harmful to your baby can pass through breast milk. It is important to ask your health care provider before taking any medicine, including all over-the-counter and prescription medicine as well as vitamin and herbal supplements. It is possible to become pregnant while breastfeeding. If birth control is desired, ask your health care provider about options that will be safe for your baby. SEEK MEDICAL CARE IF:  You feel like you want to stop breastfeeding or have become frustrated with breastfeeding.  You have painful breasts or nipples.  Your nipples are cracked or bleeding.  Your breasts are red, tender, or warm.  You have a swollen area on either breast.  You have a fever or chills.  You have nausea or vomiting.  You have drainage other than breast milk from your nipples.  Your breasts do not become full before feedings by the fifth day after you give birth.  You feel sad and depressed.  Your baby is too sleepy to eat well.  Your baby is having trouble sleeping.   Your baby is wetting less than 3 diapers in a 24-hour period.  Your baby has less than 3 stools in a 24-hour period.  Your baby's skin or the white part of his or her eyes becomes yellow.   Your baby is not gaining weight by 5 days of age. SEEK IMMEDIATE MEDICAL CARE IF:  Your baby is overly tired (lethargic) and does not want to wake up and feed.  Your baby develops an unexplained fever.   This information is not intended to replace advice given to you by your health care provider. Make sure you discuss any questions you have with your health care provider.   Document Released: 07/19/2005 Document Revised: 04/09/2015 Document Reviewed: 01/10/2013 Elsevier Interactive Patient Education 2016 [employer]  

## 2015-11-11 NOTE — Progress Notes (Signed)
Subjective:  Stacey Carrillo is a 25 y.o. [phone] at 2738w2d being seen today for ongoing prenatal care.  She is currently monitored for the following issues for this low-risk pregnancy and has Hepatitis B carrier; Supervision of normal pregnancy, antepartum; Cervical dysplasia affecting pregnancy, antepartum; Severe dysplasia of cervix (CIN III); and Grand multiparity with current pregnancy, antepartum on her problem list.  Patient reports no complaints.  Contractions: Not present. Vag. Bleeding: None.  Movement: Present. Denies leaking of fluid.   The following portions of the patient's history were reviewed and updated as appropriate: allergies, current medications, past family history, past medical history, past social history, past surgical history and problem list. Problem list updated.  Objective:   Filed Vitals:   11/11/15 0959  BP: 121/71  Pulse: 100  Weight: 194 lb (87.998 kg)    Fetal Status: Fetal Heart Rate (bpm): 146 Fundal Height: 21 cm Movement: Present     General:  Alert, oriented and cooperative. Patient is in no acute distress.  Skin: Skin is warm and dry. No rash noted.   Cardiovascular: Normal heart rate noted  Respiratory: Normal respiratory effort, no problems with respiration noted  Abdomen: Soft, gravid, appropriate for gestational age. Pain/Pressure: Absent     Pelvic: Vag. Bleeding: None Vag D/C Character: Thin   Cervical exam deferred        Extremities: Normal range of motion.  Edema: None  Mental Status: Normal mood and affect. Normal behavior. Normal judgment and thought content.   Urinalysis: Urine Protein: Trace Urine Glucose: Negative  Assessment and Plan:  Pregnancy: [id number] at 6338w2d  1. Supervision of normal pregnancy, antepartum, second trimester Continue routine prenatal care.  - US MFM OB FOLLOW UP; Future  General obstetric precautions including but not limited to vaginal bleeding, contractions, leaking of fluid and fetal movement were reviewed  in detail with the patient. Please refer to After Visit Summary for other counseling recommendations.  Return in 4 weeks (on 12/09/2015).   Reva Boresanya S Zubin Pontillo, MD

## 2015-12-09 ENCOUNTER — Other Ambulatory Visit: Payer: Self-pay | Admitting: Family Medicine

## 2015-12-09 ENCOUNTER — Ambulatory Visit (HOSPITAL_COMMUNITY)
Admission: RE | Admit: 2015-12-09 | Discharge: 2015-12-09 | Disposition: A | Discharge status: Home / Self Care | Payer: Medicaid Other | Source: Ambulatory Visit | Attending: Family Medicine | Admitting: Family Medicine

## 2015-12-09 ENCOUNTER — Ambulatory Visit (INDEPENDENT_AMBULATORY_CARE_PROVIDER_SITE_OTHER): Payer: Medicaid Other | Admitting: Family Medicine

## 2015-12-09 VITALS — BP 111/73 | HR 103 | Wt 195.0 lb

## 2015-12-09 DIAGNOSIS — O98412 Viral hepatitis complicating pregnancy, second trimester: Secondary | ICD-10-CM | POA: Insufficient documentation

## 2015-12-09 DIAGNOSIS — IMO0002 Reserved for concepts with insufficient information to code with codable children: Secondary | ICD-10-CM

## 2015-12-09 DIAGNOSIS — Z3A24 24 weeks gestation of pregnancy: Secondary | ICD-10-CM | POA: Diagnosis not present

## 2015-12-09 DIAGNOSIS — Z0489 Encounter for examination and observation for other specified reasons: Secondary | ICD-10-CM

## 2015-12-09 DIAGNOSIS — B1910 Unspecified viral hepatitis B without hepatic coma: Secondary | ICD-10-CM | POA: Diagnosis not present

## 2015-12-09 DIAGNOSIS — Z36 Encounter for antenatal screening of mother: Secondary | ICD-10-CM | POA: Diagnosis not present

## 2015-12-09 DIAGNOSIS — O34592 Maternal care for other abnormalities of gravid uterus, second trimester: Secondary | ICD-10-CM | POA: Insufficient documentation

## 2015-12-09 DIAGNOSIS — Z3482 Encounter for supervision of other normal pregnancy, second trimester: Secondary | ICD-10-CM

## 2015-12-09 DIAGNOSIS — O0942 Supervision of pregnancy with grand multiparity, second trimester: Secondary | ICD-10-CM

## 2015-12-09 NOTE — Patient Instructions (Signed)
Third Trimester of Pregnancy The third trimester is from week 29 through week 42, months 7 through 9. The third trimester is a time when the fetus is growing rapidly. At the end of the ninth month, the fetus is about 20 inches in length and weighs 6-10 pounds.  BODY CHANGES Your body goes through many changes during pregnancy. The changes vary from woman to woman.   Your weight will continue to increase. You can expect to gain 25-35 pounds (11-16 kg) by the end of the pregnancy.  You may begin to get stretch marks on your hips, abdomen, and breasts.  You may urinate more often because the fetus is moving lower into your pelvis and pressing on your bladder.  You may develop or continue to have heartburn as a result of your pregnancy.  You may develop constipation because certain hormones are causing the muscles that push waste through your intestines to slow down.  You may develop hemorrhoids or swollen, bulging veins (varicose veins).  You may have pelvic pain because of the weight gain and pregnancy hormones relaxing your joints between the bones in your pelvis. Backaches may result from overexertion of the muscles supporting your posture.  You may have changes in your hair. These can include thickening of your hair, rapid growth, and changes in texture. Some women also have hair loss during or after pregnancy, or hair that feels dry or thin. Your hair will most likely return to normal after your baby is born.  Your breasts will continue to grow and be tender. A yellow discharge may leak from your breasts called colostrum.  Your belly button may stick out.  You may feel short of breath because of your expanding uterus.  You may notice the fetus "dropping," or moving lower in your abdomen.  You may have a bloody mucus discharge. This usually occurs a few days to a week before labor begins.  Your cervix becomes thin and soft (effaced) near your due date. WHAT TO EXPECT AT YOUR  PRENATAL EXAMS  You will have prenatal exams every 2 weeks until week 36. Then, you will have weekly prenatal exams. During a routine prenatal visit:  You will be weighed to make sure you and the fetus are growing normally.  Your blood pressure is taken.  Your abdomen will be measured to track your baby's growth.  The fetal heartbeat will be listened to.  Any test results from the previous visit will be discussed.  You may have a cervical check near your due date to see if you have effaced. At around 36 weeks, your caregiver will check your cervix. At the same time, your caregiver will also perform a test on the secretions of the vaginal tissue. This test is to determine if a type of bacteria, Group B streptococcus, is present. Your caregiver will explain this further. Your caregiver may ask you:  What your birth plan is.  How you are feeling.  If you are feeling the baby move.  If you have had any abnormal symptoms, such as leaking fluid, bleeding, severe headaches, or abdominal cramping.  If you are using any tobacco products, including cigarettes, chewing tobacco, and electronic cigarettes.  If you have any questions. Other tests or screenings that may be performed during your third trimester include:  Blood tests that check for low iron levels (anemia).  Fetal testing to check the health, activity level, and growth of the fetus. Testing is done if you have certain medical conditions or if   there are problems during the pregnancy.  HIV (human immunodeficiency virus) testing. If you are at high risk, you may be screened for HIV during your third trimester of pregnancy. FALSE LABOR You may feel small, irregular contractions that eventually go away. These are called Braxton Hicks contractions, or false labor. Contractions may last for hours, days, or even weeks before true labor sets in. If contractions come at regular intervals, intensify, or become painful, it is best to be seen  by your caregiver.  SIGNS OF LABOR   Menstrual-like cramps.  Contractions that are 5 minutes apart or less.  Contractions that start on the top of the uterus and spread down to the lower abdomen and back.  A sense of increased pelvic pressure or back pain.  A watery or bloody mucus discharge that comes from the vagina. If you have any of these signs before the 37th week of pregnancy, call your caregiver right away. You need to go to the hospital to get checked immediately. HOME CARE INSTRUCTIONS   Avoid all smoking, herbs, alcohol, and unprescribed drugs. These chemicals affect the formation and growth of the baby.  Do not use any tobacco products, including cigarettes, chewing tobacco, and electronic cigarettes. If you need help quitting, ask your health care provider. You may receive counseling support and other resources to help you quit.  Follow your caregiver's instructions regarding medicine use. There are medicines that are either safe or unsafe to take during pregnancy.  Exercise only as directed by your caregiver. Experiencing uterine cramps is a good sign to stop exercising.  Continue to eat regular, healthy meals.  Wear a good support bra for breast tenderness.  Do not use hot tubs, steam rooms, or saunas.  Wear your seat belt at all times when driving.  Avoid raw meat, uncooked cheese, cat litter boxes, and soil used by cats. These carry germs that can cause birth defects in the baby.  Take your prenatal vitamins.  Take 1500-2000 mg of calcium daily starting at the 20th week of pregnancy until you deliver your baby.  Try taking a stool softener (if your caregiver approves) if you develop constipation. Eat more high-fiber foods, such as fresh vegetables or fruit and whole grains. Drink plenty of fluids to keep your urine clear or pale yellow.  Take warm sitz baths to soothe any pain or discomfort caused by hemorrhoids. Use hemorrhoid cream if your caregiver  approves.  If you develop varicose veins, wear support hose. Elevate your feet for 15 minutes, 3-4 times a day. Limit salt in your diet.  Avoid heavy lifting, wear low heal shoes, and practice good posture.  Rest a lot with your legs elevated if you have leg cramps or low back pain.  Visit your dentist if you have not gone during your pregnancy. Use a soft toothbrush to brush your teeth and be gentle when you floss.  A sexual relationship may be continued unless your caregiver directs you otherwise.  Do not travel far distances unless it is absolutely necessary and only with the approval of your caregiver.  Take prenatal classes to understand, practice, and ask questions about the labor and delivery.  Make a trial run to the hospital.  Pack your hospital bag.  Prepare the baby's nursery.  Continue to go to all your prenatal visits as directed by your caregiver. SEEK MEDICAL CARE IF:  You are unsure if you are in labor or if your water has broken.  You have dizziness.  You have   mild pelvic cramps, pelvic pressure, or nagging pain in your abdominal area.  You have persistent nausea, vomiting, or diarrhea.  You have a bad smelling vaginal discharge.  You have pain with urination. SEEK IMMEDIATE MEDICAL CARE IF:   You have a fever.  You are leaking fluid from your vagina.  You have spotting or bleeding from your vagina.  You have severe abdominal cramping or pain.  You have rapid weight loss or gain.  You have shortness of breath with chest pain.  You notice sudden or extreme swelling of your face, hands, ankles, feet, or legs.  You have not felt your baby move in over an hour.  You have severe headaches that do not go away with medicine.  You have vision changes.   This information is not intended to replace advice given to you by your health care provider. Make sure you discuss any questions you have with your health care provider.   Document Released:  07/13/2001 Document Revised: 08/09/2014 Document Reviewed: 09/19/2012 Elsevier Interactive Patient Education 2016 [employer]  Breastfeeding Deciding to breastfeed is one of the best choices you can make for you and your baby. A change in hormones during pregnancy causes your breast tissue to grow and increases the number and size of your milk ducts. These hormones also allow proteins, sugars, and fats from your blood supply to make breast milk in your milk-producing glands. Hormones prevent breast milk from being released before your baby is born as well as prompt milk flow after birth. Once breastfeeding has begun, thoughts of your baby, as well as his or her sucking or crying, can stimulate the release of milk from your milk-producing glands.  BENEFITS OF BREASTFEEDING For Your Baby  Your first milk (colostrum) helps your baby's digestive system function better.  There are antibodies in your milk that help your baby fight off infections.  Your baby has a lower incidence of asthma, allergies, and sudden infant death syndrome.  The nutrients in breast milk are better for your baby than infant formulas and are designed uniquely for your baby's needs.  Breast milk improves your baby's brain development.  Your baby is less likely to develop other conditions, such as childhood obesity, asthma, or type 2 diabetes mellitus. For You  Breastfeeding helps to create a very special bond between you and your baby.  Breastfeeding is convenient. Breast milk is always available at the correct temperature and costs nothing.  Breastfeeding helps to burn calories and helps you lose the weight gained during pregnancy.  Breastfeeding makes your uterus contract to its prepregnancy size faster and slows bleeding (lochia) after you give birth.   Breastfeeding helps to lower your risk of developing type 2 diabetes mellitus, osteoporosis, and breast or ovarian cancer later in life. SIGNS THAT YOUR BABY IS  HUNGRY Early Signs of Hunger  Increased alertness or activity.  Stretching.  Movement of the head from side to side.  Movement of the head and opening of the mouth when the corner of the mouth or cheek is stroked (rooting).  Increased sucking sounds, smacking lips, cooing, sighing, or squeaking.  Hand-to-mouth movements.  Increased sucking of fingers or hands. Late Signs of Hunger  Fussing.  Intermittent crying. Extreme Signs of Hunger Signs of extreme hunger will require calming and consoling before your baby will be able to breastfeed successfully. Do not wait for the following signs of extreme hunger to occur before you initiate breastfeeding:  Restlessness.  A loud, strong cry.  Screaming.   BREASTFEEDING BASICS Breastfeeding Initiation  Find a comfortable place to sit or lie down, with your neck and back well supported.  Place a pillow or rolled up blanket under your baby to bring him or her to the level of your breast (if you are seated). Nursing pillows are specially designed to help support your arms and your baby while you breastfeed.  Make sure that your baby's abdomen is facing your abdomen.  Gently massage your breast. With your fingertips, massage from your chest wall toward your nipple in a circular motion. This encourages milk flow. You may need to continue this action during the feeding if your milk flows slowly.  Support your breast with 4 fingers underneath and your thumb above your nipple. Make sure your fingers are well away from your nipple and your baby's mouth.  Stroke your baby's lips gently with your finger or nipple.  When your baby's mouth is open wide enough, quickly bring your baby to your breast, placing your entire nipple and as much of the colored area around your nipple (areola) as possible into your baby's mouth.  More areola should be visible above your baby's upper lip than below the lower lip.  Your baby's tongue should be between his  or her lower gum and your breast.  Ensure that your baby's mouth is correctly positioned around your nipple (latched). Your baby's lips should create a seal on your breast and be turned out (everted).  It is common for your baby to suck about 2-3 minutes in order to start the flow of breast milk. Latching Teaching your baby how to latch on to your breast properly is very important. An improper latch can cause nipple pain and decreased milk supply for you and poor weight gain in your baby. Also, if your baby is not latched onto your nipple properly, he or she may swallow some air during feeding. This can make your baby fussy. Burping your baby when you switch breasts during the feeding can help to get rid of the air. However, teaching your baby to latch on properly is still the best way to prevent fussiness from swallowing air while breastfeeding. Signs that your baby has successfully latched on to your nipple:  Silent tugging or silent sucking, without causing you pain.  Swallowing heard between every 3-4 sucks.  Muscle movement above and in front of his or her ears while sucking. Signs that your baby has not successfully latched on to nipple:  Sucking sounds or smacking sounds from your baby while breastfeeding.  Nipple pain. If you think your baby has not latched on correctly, slip your finger into the corner of your baby's mouth to break the suction and place it between your baby's gums. Attempt breastfeeding initiation again. Signs of Successful Breastfeeding Signs from your baby:  A gradual decrease in the number of sucks or complete cessation of sucking.  Falling asleep.  Relaxation of his or her body.  Retention of a small amount of milk in his or her mouth.  Letting go of your breast by himself or herself. Signs from you:  Breasts that have increased in firmness, weight, and size 1-3 hours after feeding.  Breasts that are softer immediately after  breastfeeding.  Increased milk volume, as well as a change in milk consistency and color by the fifth day of breastfeeding.  Nipples that are not sore, cracked, or bleeding. Signs That Your Baby is Getting Enough Milk  Wetting at least 3 diapers in a 24-hour period.   The urine should be clear and pale yellow by age 5 days.  At least 3 stools in a 24-hour period by age 5 days. The stool should be soft and yellow.  At least 3 stools in a 24-hour period by age 7 days. The stool should be seedy and yellow.  No loss of weight greater than 10% of birth weight during the first 3 days of age.  Average weight gain of 4-7 ounces (113-198 g) per week after age 4 days.  Consistent daily weight gain by age 5 days, without weight loss after the age of 2 weeks. After a feeding, your baby may spit up a small amount. This is common. BREASTFEEDING FREQUENCY AND DURATION Frequent feeding will help you make more milk and can prevent sore nipples and breast engorgement. Breastfeed when you feel the need to reduce the fullness of your breasts or when your baby shows signs of hunger. This is called "breastfeeding on demand." Avoid introducing a pacifier to your baby while you are working to establish breastfeeding (the first 4-6 weeks after your baby is born). After this time you may choose to use a pacifier. Research has shown that pacifier use during the first year of a baby's life decreases the risk of sudden infant death syndrome (SIDS). Allow your baby to feed on each breast as long as he or she wants. Breastfeed until your baby is finished feeding. When your baby unlatches or falls asleep while feeding from the first breast, offer the second breast. Because newborns are often sleepy in the first few weeks of life, you may need to awaken your baby to get him or her to feed. Breastfeeding times will vary from baby to baby. However, the following rules can serve as a guide to help you ensure that your baby is  properly fed:  Newborns (babies 4 weeks of age or younger) may breastfeed every 1-3 hours.  Newborns should not go longer than 3 hours during the day or 5 hours during the night without breastfeeding.  You should breastfeed your baby a minimum of 8 times in a 24-hour period until you begin to introduce solid foods to your baby at around 6 months of age. BREAST MILK PUMPING Pumping and storing breast milk allows you to ensure that your baby is exclusively fed your breast milk, even at times when you are unable to breastfeed. This is especially important if you are going back to work while you are still breastfeeding or when you are not able to be present during feedings. Your lactation consultant can give you guidelines on how long it is safe to store breast milk. A breast pump is a machine that allows you to pump milk from your breast into a sterile bottle. The pumped breast milk can then be stored in a refrigerator or freezer. Some breast pumps are operated by hand, while others use electricity. Ask your lactation consultant which type will work best for you. Breast pumps can be purchased, but some hospitals and breastfeeding support groups lease breast pumps on a monthly basis. A lactation consultant can teach you how to hand express breast milk, if you prefer not to use a pump. CARING FOR YOUR BREASTS WHILE YOU BREASTFEED Nipples can become dry, cracked, and sore while breastfeeding. The following recommendations can help keep your breasts moisturized and healthy:  Avoid using soap on your nipples.  Wear a supportive bra. Although not required, special nursing bras and tank tops are designed to allow access to your   breasts for breastfeeding without taking off your entire bra or top. Avoid wearing underwire-style bras or extremely tight bras.  Air dry your nipples for 3-4minutes after each feeding.  Use only cotton bra pads to absorb leaked breast milk. Leaking of breast milk between feedings  is normal.  Use lanolin on your nipples after breastfeeding. Lanolin helps to maintain your skin's normal moisture barrier. If you use pure lanolin, you do not need to wash it off before feeding your baby again. Pure lanolin is not toxic to your baby. You may also hand express a few drops of breast milk and gently massage that milk into your nipples and allow the milk to air dry. In the first few weeks after giving birth, some women experience extremely full breasts (engorgement). Engorgement can make your breasts feel heavy, warm, and tender to the touch. Engorgement peaks within 3-5 days after you give birth. The following recommendations can help ease engorgement:  Completely empty your breasts while breastfeeding or pumping. You may want to start by applying warm, moist heat (in the shower or with warm water-soaked hand towels) just before feeding or pumping. This increases circulation and helps the milk flow. If your baby does not completely empty your breasts while breastfeeding, pump any extra milk after he or she is finished.  Wear a snug bra (nursing or regular) or tank top for 1-2 days to signal your body to slightly decrease milk production.  Apply ice packs to your breasts, unless this is too uncomfortable for you.  Make sure that your baby is latched on and positioned properly while breastfeeding. If engorgement persists after 48 hours of following these recommendations, contact your health care provider or a lactation consultant. OVERALL HEALTH CARE RECOMMENDATIONS WHILE BREASTFEEDING  Eat healthy foods. Alternate between meals and snacks, eating 3 of each per day. Because what you eat affects your breast milk, some of the foods may make your baby more irritable than usual. Avoid eating these foods if you are sure that they are negatively affecting your baby.  Drink milk, fruit juice, and water to satisfy your thirst (about 10 glasses a day).  Rest often, relax, and continue to take  your prenatal vitamins to prevent fatigue, stress, and anemia.  Continue breast self-awareness checks.  Avoid chewing and smoking tobacco. Chemicals from cigarettes that pass into breast milk and exposure to secondhand smoke may harm your baby.  Avoid alcohol and drug use, including marijuana. Some medicines that may be harmful to your baby can pass through breast milk. It is important to ask your health care provider before taking any medicine, including all over-the-counter and prescription medicine as well as vitamin and herbal supplements. It is possible to become pregnant while breastfeeding. If birth control is desired, ask your health care provider about options that will be safe for your baby. SEEK MEDICAL CARE IF:  You feel like you want to stop breastfeeding or have become frustrated with breastfeeding.  You have painful breasts or nipples.  Your nipples are cracked or bleeding.  Your breasts are red, tender, or warm.  You have a swollen area on either breast.  You have a fever or chills.  You have nausea or vomiting.  You have drainage other than breast milk from your nipples.  Your breasts do not become full before feedings by the fifth day after you give birth.  You feel sad and depressed.  Your baby is too sleepy to eat well.  Your baby is having trouble sleeping.     Your baby is wetting less than 3 diapers in a 24-hour period.  Your baby has less than 3 stools in a 24-hour period.  Your baby's skin or the white part of his or her eyes becomes yellow.   Your baby is not gaining weight by 5 days of age. SEEK IMMEDIATE MEDICAL CARE IF:  Your baby is overly tired (lethargic) and does not want to wake up and feed.  Your baby develops an unexplained fever.   This information is not intended to replace advice given to you by your health care provider. Make sure you discuss any questions you have with your health care provider.   Document Released: 07/19/2005  Document Revised: 04/09/2015 Document Reviewed: 01/10/2013 Elsevier Interactive Patient Education 2016 [employer]  

## 2015-12-09 NOTE — Progress Notes (Signed)
Subjective:  Stacey Carrillo is a 25 y.o. [phone] at 5822w2d being seen today for ongoing prenatal care.  She is currently monitored for the following issues for this low-risk pregnancy and has Hepatitis B carrier; Supervision of normal pregnancy, antepartum; Cervical dysplasia affecting pregnancy, antepartum; Severe dysplasia of cervix (CIN III); and Grand multiparity with current pregnancy, antepartum on her problem list.  Patient reports no complaints.  Contractions: Not present. Vag. Bleeding: None.  Movement: Present. Denies leaking of fluid.   The following portions of the patient's history were reviewed and updated as appropriate: allergies, current medications, past family history, past medical history, past social history, past surgical history and problem list. Problem list updated.  Objective:   Filed Vitals:   12/09/15 1037  BP: 111/73  Pulse: 103  Weight: 195 lb (88.451 kg)    Fetal Status: Fetal Heart Rate (bpm): 142 Fundal Height: 24 cm Movement: Present     General:  Alert, oriented and cooperative. Patient is in no acute distress.  Skin: Skin is warm and dry. No rash noted.   Cardiovascular: Normal heart rate noted  Respiratory: Normal respiratory effort, no problems with respiration noted  Abdomen: Soft, gravid, appropriate for gestational age. Pain/Pressure: Present     Pelvic: Vag. Bleeding: None Vag D/C Character: Thin   Cervical exam deferred        Extremities: Normal range of motion.  Edema: None  Mental Status: Normal mood and affect. Normal behavior. Normal judgment and thought content.   Urinalysis: Urine Protein: Trace Urine Glucose: Negative  Assessment and Plan:  Pregnancy: [id number] at 2122w2d  1. Supervision of normal pregnancy, antepartum, second trimester Continue routine prenatal care.   2. Grand multiparity with current pregnancy, antepartum, second trimester At risk of pph.  Preterm labor symptoms and general obstetric precautions including but not  limited to vaginal bleeding, contractions, leaking of fluid and fetal movement were reviewed in detail with the patient. Please refer to After Visit Summary for other counseling recommendations.  Return in 4 weeks (on 01/06/2016).   Reva Boresanya S Rilla Buckman, MD

## 2016-01-06 ENCOUNTER — Ambulatory Visit (INDEPENDENT_AMBULATORY_CARE_PROVIDER_SITE_OTHER): Payer: Medicaid Other | Admitting: Family Medicine

## 2016-01-06 VITALS — BP 112/73 | HR 101 | Wt 196.0 lb

## 2016-01-06 DIAGNOSIS — O98413 Viral hepatitis complicating pregnancy, third trimester: Secondary | ICD-10-CM

## 2016-01-06 DIAGNOSIS — Z36 Encounter for antenatal screening of mother: Secondary | ICD-10-CM

## 2016-01-06 DIAGNOSIS — Z3482 Encounter for supervision of other normal pregnancy, second trimester: Secondary | ICD-10-CM

## 2016-01-06 DIAGNOSIS — Z3403 Encounter for supervision of normal first pregnancy, third trimester: Secondary | ICD-10-CM

## 2016-01-06 DIAGNOSIS — B1910 Unspecified viral hepatitis B without hepatic coma: Secondary | ICD-10-CM

## 2016-01-06 DIAGNOSIS — Z23 Encounter for immunization: Secondary | ICD-10-CM | POA: Diagnosis not present

## 2016-01-06 LAB — CBC
HEMATOCRIT: 34.7 % — AB (ref 35.0–45.0)
Hemoglobin: 11.4 g/dL — ABNORMAL LOW (ref 11.7–15.5)
MCH: 25.2 pg — ABNORMAL LOW (ref 27.0–33.0)
MCHC: 32.9 g/dL (ref 32.0–36.0)
MCV: 76.6 fL — AB (ref 80.0–100.0)
MPV: 12.1 fL (ref 7.5–12.5)
Platelets: 161 10*3/uL (ref 140–400)
RBC: 4.53 MIL/uL (ref 3.80–5.10)
RDW: 14.7 % (ref 11.0–15.0)
WBC: 7.6 10*3/uL (ref 3.8–10.8)

## 2016-01-06 NOTE — Patient Instructions (Signed)
Third Trimester of Pregnancy The third trimester is from week 29 through week 42, months 7 through 9. The third trimester is a time when the fetus is growing rapidly. At the end of the ninth month, the fetus is about 20 inches in length and weighs 6-10 pounds.  BODY CHANGES Your body goes through many changes during pregnancy. The changes vary from woman to woman.   Your weight will continue to increase. You can expect to gain 25-35 pounds (11-16 kg) by the end of the pregnancy.  You may begin to get stretch marks on your hips, abdomen, and breasts.  You may urinate more often because the fetus is moving lower into your pelvis and pressing on your bladder.  You may develop or continue to have heartburn as a result of your pregnancy.  You may develop constipation because certain hormones are causing the muscles that push waste through your intestines to slow down.  You may develop hemorrhoids or swollen, bulging veins (varicose veins).  You may have pelvic pain because of the weight gain and pregnancy hormones relaxing your joints between the bones in your pelvis. Backaches may result from overexertion of the muscles supporting your posture.  You may have changes in your hair. These can include thickening of your hair, rapid growth, and changes in texture. Some women also have hair loss during or after pregnancy, or hair that feels dry or thin. Your hair will most likely return to normal after your baby is born.  Your breasts will continue to grow and be tender. A yellow discharge may leak from your breasts called colostrum.  Your belly button may stick out.  You may feel short of breath because of your expanding uterus.  You may notice the fetus "dropping," or moving lower in your abdomen.  You may have a bloody mucus discharge. This usually occurs a few days to a week before labor begins.  Your cervix becomes thin and soft (effaced) near your due date. WHAT TO EXPECT AT YOUR  PRENATAL EXAMS  You will have prenatal exams every 2 weeks until week 36. Then, you will have weekly prenatal exams. During a routine prenatal visit:  You will be weighed to make sure you and the fetus are growing normally.  Your blood pressure is taken.  Your abdomen will be measured to track your baby's growth.  The fetal heartbeat will be listened to.  Any test results from the previous visit will be discussed.  You may have a cervical check near your due date to see if you have effaced. At around 36 weeks, your caregiver will check your cervix. At the same time, your caregiver will also perform a test on the secretions of the vaginal tissue. This test is to determine if a type of bacteria, Group B streptococcus, is present. Your caregiver will explain this further. Your caregiver may ask you:  What your birth plan is.  How you are feeling.  If you are feeling the baby move.  If you have had any abnormal symptoms, such as leaking fluid, bleeding, severe headaches, or abdominal cramping.  If you are using any tobacco products, including cigarettes, chewing tobacco, and electronic cigarettes.  If you have any questions. Other tests or screenings that may be performed during your third trimester include:  Blood tests that check for low iron levels (anemia).  Fetal testing to check the health, activity level, and growth of the fetus. Testing is done if you have certain medical conditions or if   there are problems during the pregnancy.  HIV (human immunodeficiency virus) testing. If you are at high risk, you may be screened for HIV during your third trimester of pregnancy. FALSE LABOR You may feel small, irregular contractions that eventually go away. These are called Braxton Hicks contractions, or false labor. Contractions may last for hours, days, or even weeks before true labor sets in. If contractions come at regular intervals, intensify, or become painful, it is best to be seen  by your caregiver.  SIGNS OF LABOR   Menstrual-like cramps.  Contractions that are 5 minutes apart or less.  Contractions that start on the top of the uterus and spread down to the lower abdomen and back.  A sense of increased pelvic pressure or back pain.  A watery or bloody mucus discharge that comes from the vagina. If you have any of these signs before the 37th week of pregnancy, call your caregiver right away. You need to go to the hospital to get checked immediately. HOME CARE INSTRUCTIONS   Avoid all smoking, herbs, alcohol, and unprescribed drugs. These chemicals affect the formation and growth of the baby.  Do not use any tobacco products, including cigarettes, chewing tobacco, and electronic cigarettes. If you need help quitting, ask your health care provider. You may receive counseling support and other resources to help you quit.  Follow your caregiver's instructions regarding medicine use. There are medicines that are either safe or unsafe to take during pregnancy.  Exercise only as directed by your caregiver. Experiencing uterine cramps is a good sign to stop exercising.  Continue to eat regular, healthy meals.  Wear a good support bra for breast tenderness.  Do not use hot tubs, steam rooms, or saunas.  Wear your seat belt at all times when driving.  Avoid raw meat, uncooked cheese, cat litter boxes, and soil used by cats. These carry germs that can cause birth defects in the baby.  Take your prenatal vitamins.  Take 1500-2000 mg of calcium daily starting at the 20th week of pregnancy until you deliver your baby.  Try taking a stool softener (if your caregiver approves) if you develop constipation. Eat more high-fiber foods, such as fresh vegetables or fruit and whole grains. Drink plenty of fluids to keep your urine clear or pale yellow.  Take warm sitz baths to soothe any pain or discomfort caused by hemorrhoids. Use hemorrhoid cream if your caregiver  approves.  If you develop varicose veins, wear support hose. Elevate your feet for 15 minutes, 3-4 times a day. Limit salt in your diet.  Avoid heavy lifting, wear low heal shoes, and practice good posture.  Rest a lot with your legs elevated if you have leg cramps or low back pain.  Visit your dentist if you have not gone during your pregnancy. Use a soft toothbrush to brush your teeth and be gentle when you floss.  A sexual relationship may be continued unless your caregiver directs you otherwise.  Do not travel far distances unless it is absolutely necessary and only with the approval of your caregiver.  Take prenatal classes to understand, practice, and ask questions about the labor and delivery.  Make a trial run to the hospital.  Pack your hospital bag.  Prepare the baby's nursery.  Continue to go to all your prenatal visits as directed by your caregiver. SEEK MEDICAL CARE IF:  You are unsure if you are in labor or if your water has broken.  You have dizziness.  You have   mild pelvic cramps, pelvic pressure, or nagging pain in your abdominal area.  You have persistent nausea, vomiting, or diarrhea.  You have a bad smelling vaginal discharge.  You have pain with urination. SEEK IMMEDIATE MEDICAL CARE IF:   You have a fever.  You are leaking fluid from your vagina.  You have spotting or bleeding from your vagina.  You have severe abdominal cramping or pain.  You have rapid weight loss or gain.  You have shortness of breath with chest pain.  You notice sudden or extreme swelling of your face, hands, ankles, feet, or legs.  You have not felt your baby move in over an hour.  You have severe headaches that do not go away with medicine.  You have vision changes.   This information is not intended to replace advice given to you by your health care provider. Make sure you discuss any questions you have with your health care provider.   Document Released:  07/13/2001 Document Revised: 08/09/2014 Document Reviewed: 09/19/2012 Elsevier Interactive Patient Education 2016 [employer]  Breastfeeding Deciding to breastfeed is one of the best choices you can make for you and your baby. A change in hormones during pregnancy causes your breast tissue to grow and increases the number and size of your milk ducts. These hormones also allow proteins, sugars, and fats from your blood supply to make breast milk in your milk-producing glands. Hormones prevent breast milk from being released before your baby is born as well as prompt milk flow after birth. Once breastfeeding has begun, thoughts of your baby, as well as his or her sucking or crying, can stimulate the release of milk from your milk-producing glands.  BENEFITS OF BREASTFEEDING For Your Baby  Your first milk (colostrum) helps your baby's digestive system function better.  There are antibodies in your milk that help your baby fight off infections.  Your baby has a lower incidence of asthma, allergies, and sudden infant death syndrome.  The nutrients in breast milk are better for your baby than infant formulas and are designed uniquely for your baby's needs.  Breast milk improves your baby's brain development.  Your baby is less likely to develop other conditions, such as childhood obesity, asthma, or type 2 diabetes mellitus. For You  Breastfeeding helps to create a very special bond between you and your baby.  Breastfeeding is convenient. Breast milk is always available at the correct temperature and costs nothing.  Breastfeeding helps to burn calories and helps you lose the weight gained during pregnancy.  Breastfeeding makes your uterus contract to its prepregnancy size faster and slows bleeding (lochia) after you give birth.   Breastfeeding helps to lower your risk of developing type 2 diabetes mellitus, osteoporosis, and breast or ovarian cancer later in life. SIGNS THAT YOUR BABY IS  HUNGRY Early Signs of Hunger  Increased alertness or activity.  Stretching.  Movement of the head from side to side.  Movement of the head and opening of the mouth when the corner of the mouth or cheek is stroked (rooting).  Increased sucking sounds, smacking lips, cooing, sighing, or squeaking.  Hand-to-mouth movements.  Increased sucking of fingers or hands. Late Signs of Hunger  Fussing.  Intermittent crying. Extreme Signs of Hunger Signs of extreme hunger will require calming and consoling before your baby will be able to breastfeed successfully. Do not wait for the following signs of extreme hunger to occur before you initiate breastfeeding:  Restlessness.  A loud, strong cry.  Screaming.   BREASTFEEDING BASICS Breastfeeding Initiation  Find a comfortable place to sit or lie down, with your neck and back well supported.  Place a pillow or rolled up blanket under your baby to bring him or her to the level of your breast (if you are seated). Nursing pillows are specially designed to help support your arms and your baby while you breastfeed.  Make sure that your baby's abdomen is facing your abdomen.  Gently massage your breast. With your fingertips, massage from your chest wall toward your nipple in a circular motion. This encourages milk flow. You may need to continue this action during the feeding if your milk flows slowly.  Support your breast with 4 fingers underneath and your thumb above your nipple. Make sure your fingers are well away from your nipple and your baby's mouth.  Stroke your baby's lips gently with your finger or nipple.  When your baby's mouth is open wide enough, quickly bring your baby to your breast, placing your entire nipple and as much of the colored area around your nipple (areola) as possible into your baby's mouth.  More areola should be visible above your baby's upper lip than below the lower lip.  Your baby's tongue should be between his  or her lower gum and your breast.  Ensure that your baby's mouth is correctly positioned around your nipple (latched). Your baby's lips should create a seal on your breast and be turned out (everted).  It is common for your baby to suck about 2-3 minutes in order to start the flow of breast milk. Latching Teaching your baby how to latch on to your breast properly is very important. An improper latch can cause nipple pain and decreased milk supply for you and poor weight gain in your baby. Also, if your baby is not latched onto your nipple properly, he or she may swallow some air during feeding. This can make your baby fussy. Burping your baby when you switch breasts during the feeding can help to get rid of the air. However, teaching your baby to latch on properly is still the best way to prevent fussiness from swallowing air while breastfeeding. Signs that your baby has successfully latched on to your nipple:  Silent tugging or silent sucking, without causing you pain.  Swallowing heard between every 3-4 sucks.  Muscle movement above and in front of his or her ears while sucking. Signs that your baby has not successfully latched on to nipple:  Sucking sounds or smacking sounds from your baby while breastfeeding.  Nipple pain. If you think your baby has not latched on correctly, slip your finger into the corner of your baby's mouth to break the suction and place it between your baby's gums. Attempt breastfeeding initiation again. Signs of Successful Breastfeeding Signs from your baby:  A gradual decrease in the number of sucks or complete cessation of sucking.  Falling asleep.  Relaxation of his or her body.  Retention of a small amount of milk in his or her mouth.  Letting go of your breast by himself or herself. Signs from you:  Breasts that have increased in firmness, weight, and size 1-3 hours after feeding.  Breasts that are softer immediately after  breastfeeding.  Increased milk volume, as well as a change in milk consistency and color by the fifth day of breastfeeding.  Nipples that are not sore, cracked, or bleeding. Signs That Your Baby is Getting Enough Milk  Wetting at least 3 diapers in a 24-hour period.   The urine should be clear and pale yellow by age 5 days.  At least 3 stools in a 24-hour period by age 5 days. The stool should be soft and yellow.  At least 3 stools in a 24-hour period by age 7 days. The stool should be seedy and yellow.  No loss of weight greater than 10% of birth weight during the first 3 days of age.  Average weight gain of 4-7 ounces (113-198 g) per week after age 4 days.  Consistent daily weight gain by age 5 days, without weight loss after the age of 2 weeks. After a feeding, your baby may spit up a small amount. This is common. BREASTFEEDING FREQUENCY AND DURATION Frequent feeding will help you make more milk and can prevent sore nipples and breast engorgement. Breastfeed when you feel the need to reduce the fullness of your breasts or when your baby shows signs of hunger. This is called "breastfeeding on demand." Avoid introducing a pacifier to your baby while you are working to establish breastfeeding (the first 4-6 weeks after your baby is born). After this time you may choose to use a pacifier. Research has shown that pacifier use during the first year of a baby's life decreases the risk of sudden infant death syndrome (SIDS). Allow your baby to feed on each breast as long as he or she wants. Breastfeed until your baby is finished feeding. When your baby unlatches or falls asleep while feeding from the first breast, offer the second breast. Because newborns are often sleepy in the first few weeks of life, you may need to awaken your baby to get him or her to feed. Breastfeeding times will vary from baby to baby. However, the following rules can serve as a guide to help you ensure that your baby is  properly fed:  Newborns (babies 4 weeks of age or younger) may breastfeed every 1-3 hours.  Newborns should not go longer than 3 hours during the day or 5 hours during the night without breastfeeding.  You should breastfeed your baby a minimum of 8 times in a 24-hour period until you begin to introduce solid foods to your baby at around 6 months of age. BREAST MILK PUMPING Pumping and storing breast milk allows you to ensure that your baby is exclusively fed your breast milk, even at times when you are unable to breastfeed. This is especially important if you are going back to work while you are still breastfeeding or when you are not able to be present during feedings. Your lactation consultant can give you guidelines on how long it is safe to store breast milk. A breast pump is a machine that allows you to pump milk from your breast into a sterile bottle. The pumped breast milk can then be stored in a refrigerator or freezer. Some breast pumps are operated by hand, while others use electricity. Ask your lactation consultant which type will work best for you. Breast pumps can be purchased, but some hospitals and breastfeeding support groups lease breast pumps on a monthly basis. A lactation consultant can teach you how to hand express breast milk, if you prefer not to use a pump. CARING FOR YOUR BREASTS WHILE YOU BREASTFEED Nipples can become dry, cracked, and sore while breastfeeding. The following recommendations can help keep your breasts moisturized and healthy:  Avoid using soap on your nipples.  Wear a supportive bra. Although not required, special nursing bras and tank tops are designed to allow access to your   breasts for breastfeeding without taking off your entire bra or top. Avoid wearing underwire-style bras or extremely tight bras.  Air dry your nipples for 3-4minutes after each feeding.  Use only cotton bra pads to absorb leaked breast milk. Leaking of breast milk between feedings  is normal.  Use lanolin on your nipples after breastfeeding. Lanolin helps to maintain your skin's normal moisture barrier. If you use pure lanolin, you do not need to wash it off before feeding your baby again. Pure lanolin is not toxic to your baby. You may also hand express a few drops of breast milk and gently massage that milk into your nipples and allow the milk to air dry. In the first few weeks after giving birth, some women experience extremely full breasts (engorgement). Engorgement can make your breasts feel heavy, warm, and tender to the touch. Engorgement peaks within 3-5 days after you give birth. The following recommendations can help ease engorgement:  Completely empty your breasts while breastfeeding or pumping. You may want to start by applying warm, moist heat (in the shower or with warm water-soaked hand towels) just before feeding or pumping. This increases circulation and helps the milk flow. If your baby does not completely empty your breasts while breastfeeding, pump any extra milk after he or she is finished.  Wear a snug bra (nursing or regular) or tank top for 1-2 days to signal your body to slightly decrease milk production.  Apply ice packs to your breasts, unless this is too uncomfortable for you.  Make sure that your baby is latched on and positioned properly while breastfeeding. If engorgement persists after 48 hours of following these recommendations, contact your health care provider or a lactation consultant. OVERALL HEALTH CARE RECOMMENDATIONS WHILE BREASTFEEDING  Eat healthy foods. Alternate between meals and snacks, eating 3 of each per day. Because what you eat affects your breast milk, some of the foods may make your baby more irritable than usual. Avoid eating these foods if you are sure that they are negatively affecting your baby.  Drink milk, fruit juice, and water to satisfy your thirst (about 10 glasses a day).  Rest often, relax, and continue to take  your prenatal vitamins to prevent fatigue, stress, and anemia.  Continue breast self-awareness checks.  Avoid chewing and smoking tobacco. Chemicals from cigarettes that pass into breast milk and exposure to secondhand smoke may harm your baby.  Avoid alcohol and drug use, including marijuana. Some medicines that may be harmful to your baby can pass through breast milk. It is important to ask your health care provider before taking any medicine, including all over-the-counter and prescription medicine as well as vitamin and herbal supplements. It is possible to become pregnant while breastfeeding. If birth control is desired, ask your health care provider about options that will be safe for your baby. SEEK MEDICAL CARE IF:  You feel like you want to stop breastfeeding or have become frustrated with breastfeeding.  You have painful breasts or nipples.  Your nipples are cracked or bleeding.  Your breasts are red, tender, or warm.  You have a swollen area on either breast.  You have a fever or chills.  You have nausea or vomiting.  You have drainage other than breast milk from your nipples.  Your breasts do not become full before feedings by the fifth day after you give birth.  You feel sad and depressed.  Your baby is too sleepy to eat well.  Your baby is having trouble sleeping.     Your baby is wetting less than 3 diapers in a 24-hour period.  Your baby has less than 3 stools in a 24-hour period.  Your baby's skin or the white part of his or her eyes becomes yellow.   Your baby is not gaining weight by 5 days of age. SEEK IMMEDIATE MEDICAL CARE IF:  Your baby is overly tired (lethargic) and does not want to wake up and feed.  Your baby develops an unexplained fever.   This information is not intended to replace advice given to you by your health care provider. Make sure you discuss any questions you have with your health care provider.   Document Released: 07/19/2005  Document Revised: 04/09/2015 Document Reviewed: 01/10/2013 Elsevier Interactive Patient Education 2016 [employer]  

## 2016-01-06 NOTE — Progress Notes (Signed)
Subjective:  Stacey MoselleFatima Carrillo is a 25 y.o. [phone] at 278w2d being seen today for ongoing prenatal care.  She is currently monitored for the following issues for this low-risk pregnancy and has Hepatitis B carrier; Supervision of normal pregnancy, antepartum; Cervical dysplasia affecting pregnancy, antepartum; Severe dysplasia of cervix (CIN III); and Grand multiparity with current pregnancy, antepartum on her problem list.  Patient reports no complaints.  Contractions: Not present. Vag. Bleeding: None.  Movement: Present. Denies leaking of fluid.   The following portions of the patient's history were reviewed and updated as appropriate: allergies, current medications, past family history, past medical history, past social history, past surgical history and problem list. Problem list updated.  Objective:   Filed Vitals:   01/06/16 1029  BP: 112/73  Pulse: 101  Weight: 196 lb (88.905 kg)    Fetal Status: Fetal Heart Rate (bpm): 145 Fundal Height: 28 cm Movement: Present     General:  Alert, oriented and cooperative. Patient is in no acute distress.  Skin: Skin is warm and dry. No rash noted.   Cardiovascular: Normal heart rate noted  Respiratory: Normal respiratory effort, no problems with respiration noted  Abdomen: Soft, gravid, appropriate for gestational age. Pain/Pressure: Present     Pelvic: Vag. Bleeding: None Vag D/C Character: Thin   Cervical exam deferred        Extremities: Normal range of motion.  Edema: None  Mental Status: Normal mood and affect. Normal behavior. Normal judgment and thought content.   Urinalysis: Urine Protein: Negative Urine Glucose: Negative  Assessment and Plan:  Pregnancy: [id number] at 578w2d  1. Supervision of normal first pregnancy, antepartum, third trimester Continue routine prenatal care. 28 wk labs today - HIV antibody - RPR - Glucose Tolerance, 1 HR (50g) - Tdap vaccine greater than or equal to 7yo IM - CBC  2. Hepatitis B affecting pregnancy,  antepartum, third trimester - Hepatitis B DNA, ultraquantitative, PCR    Preterm labor symptoms and general obstetric precautions including but not limited to vaginal bleeding, contractions, leaking of fluid and fetal movement were reviewed in detail with the patient. Please refer to After Visit Summary for other counseling recommendations.  Return in 2 weeks (on 01/20/2016).   Stacey Boresanya S Ashanti Ratti, MD

## 2016-01-07 ENCOUNTER — Telehealth: Payer: Self-pay | Admitting: *Deleted

## 2016-01-07 LAB — RPR

## 2016-01-07 LAB — GLUCOSE TOLERANCE, 1 HOUR (50G) W/O FASTING: GLUCOSE, 1 HR, GESTATIONAL: 143 mg/dL — AB (ref <140)

## 2016-01-07 LAB — HIV ANTIBODY (ROUTINE TESTING W REFLEX): HIV: NONREACTIVE

## 2016-01-07 NOTE — Telephone Encounter (Signed)
Called pt to adv needs 3Hr Glucose. LM for her to rtn call to schedule appt.

## 2016-01-07 NOTE — Telephone Encounter (Signed)
-----   Message from Reva Boresanya S Pratt, MD sent at 01/07/2016  7:58 AM EDT ----- Abnl 1 hour--needs 3 hour

## 2016-01-09 LAB — HEPATITIS B DNA, ULTRAQUANTITATIVE, PCR
HEPATITIS B DNA: 93 [IU]/mL — AB (ref <20)
Hepatitis B DNA (Calc): 1.97 Log IU/mL — ABNORMAL HIGH (ref <1.30)

## 2016-01-20 ENCOUNTER — Ambulatory Visit (INDEPENDENT_AMBULATORY_CARE_PROVIDER_SITE_OTHER): Payer: Medicaid Other | Admitting: Obstetrics and Gynecology

## 2016-01-20 VITALS — BP 113/73 | HR 98 | Wt 195.0 lb

## 2016-01-20 DIAGNOSIS — O0943 Supervision of pregnancy with grand multiparity, third trimester: Secondary | ICD-10-CM

## 2016-01-20 DIAGNOSIS — B181 Chronic viral hepatitis B without delta-agent: Secondary | ICD-10-CM

## 2016-01-20 DIAGNOSIS — Z3483 Encounter for supervision of other normal pregnancy, third trimester: Secondary | ICD-10-CM

## 2016-01-20 NOTE — Progress Notes (Signed)
Subjective:  Stacey Carrillo is a 25 y.o. [phone] at 1857w2d being seen today for ongoing prenatal care.  She is currently monitored for the following issues for this low-risk pregnancy and has Hepatitis B carrier; Supervision of normal pregnancy, antepartum; Cervical dysplasia affecting pregnancy, antepartum; Severe dysplasia of cervix (CIN III); and Grand multiparity with current pregnancy, antepartum on her problem list.  Patient reports no complaints.  Contractions: Irritability. Vag. Bleeding: None.  Movement: Present. Denies leaking of fluid.   The following portions of the patient's history were reviewed and updated as appropriate: allergies, current medications, past family history, past medical history, past social history, past surgical history and problem list. Problem list updated.  Objective:   Filed Vitals:   01/20/16 1109  BP: 113/73  Pulse: 98  Weight: 195 lb (88.451 kg)    Fetal Status:     Movement: Present     General:  Alert, oriented and cooperative. Patient is in no acute distress.  Skin: Skin is warm and dry. No rash noted.   Cardiovascular: Normal heart rate noted  Respiratory: Normal respiratory effort, no problems with respiration noted  Abdomen: Soft, gravid, appropriate for gestational age. Pain/Pressure: Present     Pelvic: Cervical exam deferred        Extremities: Normal range of motion.  Edema: None  Mental Status: Normal mood and affect. Normal behavior. Normal judgment and thought content.   Urinalysis:      Assessment and Plan:  Pregnancy: [id number] at 6957w2d  1. Supervision of normal pregnancy, antepartum, third trimester Patient is doing well Is aware of need for 3 hour glucola Plans on depo-provera for contraception  2. Grand multiparity with current pregnancy, antepartum, third trimester   3. Hepatitis B carrier   Preterm labor symptoms and general obstetric precautions including but not limited to vaginal bleeding, contractions, leaking of  fluid and fetal movement were reviewed in detail with the patient. Please refer to After Visit Summary for other counseling recommendations.  Return in about 2 weeks (around 02/03/2016).   Catalina AntiguaPeggy Callie Bunyard, MD

## 2016-01-23 ENCOUNTER — Encounter: Payer: Self-pay | Admitting: *Deleted

## 2016-01-30 ENCOUNTER — Other Ambulatory Visit (INDEPENDENT_AMBULATORY_CARE_PROVIDER_SITE_OTHER): Payer: Medicaid Other | Admitting: *Deleted

## 2016-01-30 DIAGNOSIS — R7302 Impaired glucose tolerance (oral): Secondary | ICD-10-CM

## 2016-01-30 DIAGNOSIS — R7309 Other abnormal glucose: Secondary | ICD-10-CM

## 2016-01-30 NOTE — Progress Notes (Signed)
Pt here for 3h GTT  

## 2016-01-31 LAB — GLUCOSE TOLERANCE, 3 HOURS
GLUCOSE, 1 HOUR-GESTATIONAL: 144 mg/dL (ref <190)
GLUCOSE, FASTING-GESTATIONAL: 78 mg/dL (ref 65–104)
Glucose Tolerance, 2 hour: 131 mg/dL (ref <165)
Glucose, GTT - 3 Hour: 129 mg/dL (ref <145)

## 2016-02-04 ENCOUNTER — Ambulatory Visit (INDEPENDENT_AMBULATORY_CARE_PROVIDER_SITE_OTHER): Payer: Medicaid Other | Admitting: Obstetrics and Gynecology

## 2016-02-04 VITALS — BP 104/70 | HR 102 | Wt 194.0 lb

## 2016-02-04 DIAGNOSIS — Z3483 Encounter for supervision of other normal pregnancy, third trimester: Secondary | ICD-10-CM

## 2016-02-04 DIAGNOSIS — D069 Carcinoma in situ of cervix, unspecified: Secondary | ICD-10-CM

## 2016-02-04 DIAGNOSIS — B181 Chronic viral hepatitis B without delta-agent: Secondary | ICD-10-CM

## 2016-02-04 LAB — COMPREHENSIVE METABOLIC PANEL
ALT: 13 U/L (ref 6–29)
AST: 16 U/L (ref 10–30)
Albumin: 3.3 g/dL — ABNORMAL LOW (ref 3.6–5.1)
Alkaline Phosphatase: 95 U/L (ref 33–115)
BUN: 5 mg/dL — AB (ref 7–25)
CHLORIDE: 107 mmol/L (ref 98–110)
CO2: 21 mmol/L (ref 20–31)
CREATININE: 0.33 mg/dL — AB (ref 0.50–1.10)
Calcium: 8.7 mg/dL (ref 8.6–10.2)
Glucose, Bld: 81 mg/dL (ref 65–99)
Potassium: 3.9 mmol/L (ref 3.5–5.3)
SODIUM: 137 mmol/L (ref 135–146)
TOTAL PROTEIN: 5.7 g/dL — AB (ref 6.1–8.1)
Total Bilirubin: 0.3 mg/dL (ref 0.2–1.2)

## 2016-02-04 NOTE — Progress Notes (Signed)
Subjective:  Stacey Carrillo is a 25 y.o. [phone] at 62w3dbeing seen today for ongoing prenatal care.  She is currently monitored for the following issues for this high-risk pregnancy and has Hepatitis B carrier; Supervision of normal pregnancy, antepartum; Cervical dysplasia affecting pregnancy, antepartum; Severe dysplasia of cervix (CIN III); and GClarkmultiparity with current pregnancy, antepartum on her problem list.  Patient reports no complaints.   Contractions: Irregular. Vag. Bleeding: None.  Movement: Present. Denies leaking of fluid.   The following portions of the patient's history were reviewed and updated as appropriate: allergies, current medications, past family history, past medical history, past social history, past surgical history and problem list. Problem list updated.  Objective:   Filed Vitals:   02/04/16 1102  BP: 104/70  Pulse: 102  Weight: 194 lb (87.998 kg)    Fetal Status: Fetal Heart Rate (bpm): 138   Movement: Present     General:  Alert, oriented and cooperative. Patient is in no acute distress.  Skin: Skin is warm and dry. No rash noted.   Cardiovascular: Normal heart rate noted  Respiratory: Normal respiratory effort, no problems with respiration noted  Abdomen: Soft, gravid, appropriate for gestational age. Pain/Pressure: Present     Pelvic:  Cervical exam deferred        Extremities: Normal range of motion.  Edema: None  Mental Status: Normal mood and affect. Normal behavior. Normal judgment and thought content.   Urinalysis: Urine Protein: Trace Urine Glucose: Negative  Assessment and Plan:  Pregnancy: [id number] 324w3d1. Supervision of normal pregnancy, antepartum, third trimester Routine care. Depo   2. Hepatitis B carrier Lower VL at last visit. Has not seen GI or MFM and she doesn't have a GI provider. Will refer to MFM for any recs and get cmp and hep c screening for today - Comp Met (CMET) - AMB referral to maternal fetal medicine -  Hepatitis C Antibody  3. Severe dysplasia of cervix (CIN III) Needs LEEP PP  Preterm labor symptoms and general obstetric precautions including but not limited to vaginal bleeding, contractions, leaking of fluid and fetal movement were reviewed in detail with the patient. Please refer to After Visit Summary for other counseling recommendations.  Return in about 2 weeks (around 02/18/2016).   ChAletha HalimMD

## 2016-02-05 ENCOUNTER — Encounter (HOSPITAL_COMMUNITY): Payer: Self-pay

## 2016-02-05 LAB — HEPATITIS C ANTIBODY: HCV AB: NEGATIVE

## 2016-02-06 ENCOUNTER — Encounter (HOSPITAL_COMMUNITY): Payer: Self-pay

## 2016-02-06 ENCOUNTER — Ambulatory Visit (HOSPITAL_COMMUNITY)
Admission: RE | Admit: 2016-02-06 | Discharge: 2016-02-06 | Disposition: A | Discharge status: Home / Self Care | Payer: Medicaid Other | Source: Ambulatory Visit | Attending: Obstetrics and Gynecology | Admitting: Obstetrics and Gynecology

## 2016-02-06 VITALS — BP 112/68 | HR 98 | Wt 195.6 lb

## 2016-02-06 DIAGNOSIS — B181 Chronic viral hepatitis B without delta-agent: Secondary | ICD-10-CM | POA: Insufficient documentation

## 2016-02-06 DIAGNOSIS — O9A113 Malignant neoplasm complicating pregnancy, third trimester: Secondary | ICD-10-CM | POA: Insufficient documentation

## 2016-02-06 DIAGNOSIS — O99333 Smoking (tobacco) complicating pregnancy, third trimester: Secondary | ICD-10-CM | POA: Insufficient documentation

## 2016-02-06 DIAGNOSIS — Z3A32 32 weeks gestation of pregnancy: Secondary | ICD-10-CM | POA: Diagnosis not present

## 2016-02-06 DIAGNOSIS — O98419 Viral hepatitis complicating pregnancy, unspecified trimester: Secondary | ICD-10-CM

## 2016-02-06 DIAGNOSIS — D069 Carcinoma in situ of cervix, unspecified: Secondary | ICD-10-CM | POA: Insufficient documentation

## 2016-02-06 DIAGNOSIS — O98413 Viral hepatitis complicating pregnancy, third trimester: Secondary | ICD-10-CM | POA: Insufficient documentation

## 2016-02-06 DIAGNOSIS — F17210 Nicotine dependence, cigarettes, uncomplicated: Secondary | ICD-10-CM | POA: Diagnosis not present

## 2016-02-06 DIAGNOSIS — O0943 Supervision of pregnancy with grand multiparity, third trimester: Secondary | ICD-10-CM | POA: Diagnosis not present

## 2016-02-06 LAB — HEPATITIS B SURFACE AG, CONFIRM: HBSAG CONFIRMATION: POSITIVE — AB

## 2016-02-06 LAB — HEPATITIS B SURFACE ANTIGEN

## 2016-02-06 NOTE — Consult Note (Signed)
Maternal Fetal Medicine Consultation  Requesting Provider(s): Stacey Bingharlie Pickens, MD  Reason for consultation: Chronic Hepatitis B  HPI: Stacey Carrillo is a 25 yo [id number] EDD 03/28/2016 who is currently at 32w 5d seen for consultation due to Chronic Hepatitis B.  The patient reports that she was first diagnosed with Hepatitis B shortly before the birth of her second child.  The patient's LFTs have been normal, and her most recent HBV DNA was 93 IU/ml on 01/07/2016.  Her Hep C antibodies were negative.  Stacey Carrillo's prenatal course has also been complicated by severer dysplasia (CIN III) with plans for a LEEP procedure following delivery.  She is without complaints today.  OB History: OB History    Gravida Para Term Preterm AB TAB SAB Ectopic Multiple Living   10 5 5  0 4 2 2  0 0 5      PMH:  Past Medical History  Diagnosis Date  . Anemia   . Preterm labor   . Group B streptococcal infection   . HPV (human papilloma virus) infection   . Abnormal Pap smear   . Hepatitis B     PSH:  Past Surgical History  Procedure Laterality Date  . Dilation and curettage of uterus     Meds:  Current Outpatient Prescriptions on File Prior to Encounter  Medication Sig Dispense Refill  . Prenatal Multivit-Min-Fe-FA (PRENATAL VITAMINS) 0.8 MG tablet Take 1 tablet by mouth daily. 30 tablet 12   No current facility-administered medications on file prior to encounter.   Allergies: No Known Allergies   FH:  Family History  Problem Relation Age of Onset  . Hearing loss Brother   . Asthma Son    Soc:  Social History   Social History  . Marital Status: Single    Spouse Name: N/A  . Number of Children: N/A  . Years of Education: N/A   Occupational History  . Not on file.   Social History Main Topics  . Smoking status: Current Some Day Smoker -- 0.25 packs/day    Types: Cigarettes  . Smokeless tobacco: Never Used     Comment: pt stated she is trying to quite down to 1 ciggarette/day  . Alcohol  Use: Yes     Comment: social  . Drug Use: No  . Sexual Activity:    Partners: Male    Birth Control/ Protection: None   Other Topics Concern  . Not on file   Social History Narrative    Review of Systems: no vaginal bleeding or cramping/contractions, no LOF, no nausea/vomiting. All other systems reviewed and are negative.   PE:   Filed Vitals:   02/06/16 0922  BP: 112/68  Pulse: 98    Labs:  CMP     Component Value Date/Time   NA 137 02/04/2016 1119   K 3.9 02/04/2016 1119   CL 107 02/04/2016 1119   CO2 21 02/04/2016 1119   GLUCOSE 81 02/04/2016 1119   GLUCOSE 78 01/30/2016 0808   BUN 5* 02/04/2016 1119   CREATININE 0.33* 02/04/2016 1119   CREATININE 0.53 04/21/2012 2008   CALCIUM 8.7 02/04/2016 1119   PROT 5.7* 02/04/2016 1119   ALBUMIN 3.3* 02/04/2016 1119   AST 16 02/04/2016 1119   ALT 13 02/04/2016 1119   ALKPHOS 95 02/04/2016 1119   BILITOT 0.3 02/04/2016 1119   GFRNONAA >90 04/21/2012 2008   GFRAA >90 04/21/2012 2008    A/P: 1) Single IUP at 32w 5d  2) Chronic Hepatitis B - Ms.  Carrillo recently had normal LFTs.  The Hepatitis B DNA (viral load) from 6/7 was 93 which is well below the treatment threshold (would offer treatment for HBV DNA > 200,000 int units/ml). Additionally, she is Hep C negative. From my review of her previous labs, I was unable to identify if the patient had been tested for HBeAg and anti-HBe which was ordered today.  HBeAg positive patients may be at higher risks for Hepatitis B flares or may be at higher risk for maternal-fetal transmission.  Would recommend that the Pediatric team be notifies when Stacey Carrillo presents in labor.  The newborn should receive Hep B IG and should receive the Hep B vaccine series within 12 hours of life.   Would also recommend follow up with GI after delivery for long-term care, but should not need any additional interventions during her pregnancy.  3) Severe dysplasia (CIN III) - follow up per OB team.  Plan LEEP  procedure after delivery  4) Grand multiparity   Thank you for the opportunity to be a part of the care of [address]. Please contact our office if we can be of further assistance.   I spent approximately 30 minutes with this patient with over 50% of time spent in face-to-face counseling.  Alpha GulaPaul Rileyann Florance, MD Maternal Fetal Medicine

## 2016-02-08 LAB — HEPATITIS B E ANTIGEN: Hep B E Ag: NEGATIVE

## 2016-02-10 LAB — HEPATITIS B E ANTIBODY: HEP B E AB: POSITIVE — AB

## 2016-02-17 ENCOUNTER — Ambulatory Visit (INDEPENDENT_AMBULATORY_CARE_PROVIDER_SITE_OTHER): Payer: Medicaid Other | Admitting: Obstetrics & Gynecology

## 2016-02-17 VITALS — BP 113/73 | HR 102 | Wt 196.0 lb

## 2016-02-17 DIAGNOSIS — O2243 Hemorrhoids in pregnancy, third trimester: Secondary | ICD-10-CM

## 2016-02-17 DIAGNOSIS — Z3483 Encounter for supervision of other normal pregnancy, third trimester: Secondary | ICD-10-CM

## 2016-02-17 MED ORDER — HYDROCORTISONE ACETATE 25 MG RE SUPP: 25.0000 mg | Freq: Two times a day (BID) | RECTAL | Status: DC

## 2016-02-17 MED ORDER — HYDROCORTISONE 2.5 % RE CREA: 1.0000 "application " | TOPICAL_CREAM | Freq: Two times a day (BID) | RECTAL | Status: DC

## 2016-02-17 NOTE — Patient Instructions (Signed)

## 2016-02-17 NOTE — Progress Notes (Signed)
Subjective:  Stacey Carrillo is a 25 y.o. [phone] at 61103w2d being seen today for ongoing prenatal care.  She is currently monitored for the following issues for this low-risk pregnancy and has Hepatitis B carrier; Supervision of normal pregnancy, antepartum; Cervical dysplasia affecting pregnancy, antepartum; Severe dysplasia of cervix (CIN III); and Grand multiparity with current pregnancy, antepartum on her problem list.  Patient reports no complaints.  Contractions: Irregular (Simultaneous filing. User may not have seen previous data.). Vag. Bleeding: None (Simultaneous filing. User may not have seen previous data.).  Movement: Present (Simultaneous filing. User may not have seen previous data.). Denies leaking of fluid.   The following portions of the patient's history were reviewed and updated as appropriate: allergies, current medications, past family history, past medical history, past social history, past surgical history and problem list. Problem list updated.  Objective:   Filed Vitals:   02/17/16 1055  BP: 113/73  Pulse: 102  Weight: 196 lb (88.905 kg)    Fetal Status: Fetal Heart Rate (bpm): 134   Movement: Present (Simultaneous filing. User may not have seen previous data.)     General:  Alert, oriented and cooperative. Patient is in no acute distress.  Skin: Skin is warm and dry. No rash noted.   Cardiovascular: Normal heart rate noted  Respiratory: Normal respiratory effort, no problems with respiration noted  Abdomen: Soft, gravid, appropriate for gestational age. Pain/Pressure: Present (Simultaneous filing. User may not have seen previous data.)     Pelvic:  Cervical exam deferred        Extremities: Normal range of motion.  Edema: None (Simultaneous filing. User may not have seen previous data.)  Mental Status: Normal mood and affect. Normal behavior. Normal judgment and thought content.   Urinalysis: Urine Protein: Trace Urine Glucose: Negative  Assessment and Plan:   Pregnancy: [id number] at 47103w2d  1. Hemorrhoids during pregnancy, third trimester  - hydrocortisone (ANUSOL-HC) 25 MG suppository; Place 1 suppository (25 mg total) rectally 2 (two) times daily.  Dispense: 12 suppository; Refill: 1  Preterm labor symptoms and general obstetric precautions including but not limited to vaginal bleeding, contractions, leaking of fluid and fetal movement were reviewed in detail with the patient. Please refer to After Visit Summary for other counseling recommendations.  No Follow-up on file.   Adam PhenixJames G Arnold, MD

## 2016-03-02 ENCOUNTER — Other Ambulatory Visit (HOSPITAL_COMMUNITY)
Admission: RE | Admit: 2016-03-02 | Discharge: 2016-03-02 | Disposition: A | Discharge status: Home / Self Care | Payer: Medicaid Other | Source: Ambulatory Visit | Attending: Family Medicine | Admitting: Family Medicine

## 2016-03-02 ENCOUNTER — Ambulatory Visit (INDEPENDENT_AMBULATORY_CARE_PROVIDER_SITE_OTHER): Payer: Medicaid Other | Admitting: Family Medicine

## 2016-03-02 VITALS — BP 118/70 | HR 96 | Wt 195.0 lb

## 2016-03-02 DIAGNOSIS — Z113 Encounter for screening for infections with a predominantly sexual mode of transmission: Secondary | ICD-10-CM | POA: Diagnosis present

## 2016-03-02 DIAGNOSIS — Z36 Encounter for antenatal screening of mother: Secondary | ICD-10-CM

## 2016-03-02 DIAGNOSIS — Z3483 Encounter for supervision of other normal pregnancy, third trimester: Secondary | ICD-10-CM

## 2016-03-02 LAB — OB RESULTS CONSOLE GBS: STREP GROUP B AG: POSITIVE

## 2016-03-02 NOTE — Progress Notes (Signed)
Subjective:  Stacey Carrillo is a 25 y.o. [phone] at [redacted]w[redacted]d being seen today for ongoing prenatal care.  She is currently monitored for the following issues for this low-risk pregnancy and has Hepatitis B carrier; Supervision of normal pregnancy, antepartum; Cervical dysplasia affecting pregnancy, antepartum; Severe dysplasia of cervix (CIN III); and Grand multiparity with current pregnancy, antepartum on her problem list.  Patient reports no complaints.  Contractions: Irregular. Vag. Bleeding: None.  Movement: Present. Denies leaking of fluid.   The following portions of the patient's history were reviewed and updated as appropriate: allergies, current medications, past family history, past medical history, past social history, past surgical history and problem list. Problem list updated.  Objective:   Vitals:   03/02/16 1102  BP: 118/70  Pulse: 96  Weight: 195 lb (88.5 kg)    Fetal Status: Fetal Heart Rate (bpm): 136 Fundal Height: 36 cm Movement: Present  Presentation: Vertex  General:  Alert, oriented and cooperative. Patient is in no acute distress.  Skin: Skin is warm and dry. No rash noted.   Cardiovascular: Normal heart rate noted  Respiratory: Normal respiratory effort, no problems with respiration noted  Abdomen: Soft, gravid, appropriate for gestational age. Pain/Pressure: Present     Pelvic:  Cervical exam performed Dilation: 1.5 Effacement (%): Thick Station: Ballotable  Extremities: Normal range of motion.  Edema: None  Mental Status: Normal mood and affect. Normal behavior. Normal judgment and thought content.   Urinalysis: Urine Protein: Trace Urine Glucose: Negative  Assessment and Plan:  Pregnancy: [id number] at [redacted]w[redacted]d  1. Supervision of normal pregnancy, antepartum, third trimester Continue routine prenatal care.  - Culture, beta strep (group b only) - Urine cytology ancillary only  Term labor symptoms and general obstetric precautions including but not limited to  vaginal bleeding, contractions, leaking of fluid and fetal movement were reviewed in detail with the patient. Please refer to After Visit Summary for other counseling recommendations.  Return in 1 week (on 03/09/2016).   Reva Bores, MD

## 2016-03-02 NOTE — Patient Instructions (Signed)
Third Trimester of Pregnancy The third trimester is from week 29 through week 42, months 7 through 9. The third trimester is a time when the fetus is growing rapidly. At the end of the ninth month, the fetus is about 20 inches in length and weighs 6-10 pounds.  BODY CHANGES Your body goes through many changes during pregnancy. The changes vary from woman to woman.   Your weight will continue to increase. You can expect to gain 25-35 pounds (11-16 kg) by the end of the pregnancy.  You may begin to get stretch marks on your hips, abdomen, and breasts.  You may urinate more often because the fetus is moving lower into your pelvis and pressing on your bladder.  You may develop or continue to have heartburn as a result of your pregnancy.  You may develop constipation because certain hormones are causing the muscles that push waste through your intestines to slow down.  You may develop hemorrhoids or swollen, bulging veins (varicose veins).  You may have pelvic pain because of the weight gain and pregnancy hormones relaxing your joints between the bones in your pelvis. Backaches may result from overexertion of the muscles supporting your posture.  You may have changes in your hair. These can include thickening of your hair, rapid growth, and changes in texture. Some women also have hair loss during or after pregnancy, or hair that feels dry or thin. Your hair will most likely return to normal after your baby is born.  Your breasts will continue to grow and be tender. A yellow discharge may leak from your breasts called colostrum.  Your belly button may stick out.  You may feel short of breath because of your expanding uterus.  You may notice the fetus "dropping," or moving lower in your abdomen.  You may have a bloody mucus discharge. This usually occurs a few days to a week before labor begins.  Your cervix becomes thin and soft (effaced) near your due date. WHAT TO EXPECT AT YOUR  PRENATAL EXAMS  You will have prenatal exams every 2 weeks until week 36. Then, you will have weekly prenatal exams. During a routine prenatal visit:  You will be weighed to make sure you and the fetus are growing normally.  Your blood pressure is taken.  Your abdomen will be measured to track your baby's growth.  The fetal heartbeat will be listened to.  Any test results from the previous visit will be discussed.  You may have a cervical check near your due date to see if you have effaced. At around 36 weeks, your caregiver will check your cervix. At the same time, your caregiver will also perform a test on the secretions of the vaginal tissue. This test is to determine if a type of bacteria, Group B streptococcus, is present. Your caregiver will explain this further. Your caregiver may ask you:  What your birth plan is.  How you are feeling.  If you are feeling the baby move.  If you have had any abnormal symptoms, such as leaking fluid, bleeding, severe headaches, or abdominal cramping.  If you are using any tobacco products, including cigarettes, chewing tobacco, and electronic cigarettes.  If you have any questions. Other tests or screenings that may be performed during your third trimester include:  Blood tests that check for low iron levels (anemia).  Fetal testing to check the health, activity level, and growth of the fetus. Testing is done if you have certain medical conditions or if   there are problems during the pregnancy.  HIV (human immunodeficiency virus) testing. If you are at high risk, you may be screened for HIV during your third trimester of pregnancy. FALSE LABOR You may feel small, irregular contractions that eventually go away. These are called Braxton Hicks contractions, or false labor. Contractions may last for hours, days, or even weeks before true labor sets in. If contractions come at regular intervals, intensify, or become painful, it is best to be seen  by your caregiver.  SIGNS OF LABOR   Menstrual-like cramps.  Contractions that are 5 minutes apart or less.  Contractions that start on the top of the uterus and spread down to the lower abdomen and back.  A sense of increased pelvic pressure or back pain.  A watery or bloody mucus discharge that comes from the vagina. If you have any of these signs before the 37th week of pregnancy, call your caregiver right away. You need to go to the hospital to get checked immediately. HOME CARE INSTRUCTIONS   Avoid all smoking, herbs, alcohol, and unprescribed drugs. These chemicals affect the formation and growth of the baby.  Do not use any tobacco products, including cigarettes, chewing tobacco, and electronic cigarettes. If you need help quitting, ask your health care provider. You may receive counseling support and other resources to help you quit.  Follow your caregiver's instructions regarding medicine use. There are medicines that are either safe or unsafe to take during pregnancy.  Exercise only as directed by your caregiver. Experiencing uterine cramps is a good sign to stop exercising.  Continue to eat regular, healthy meals.  Wear a good support bra for breast tenderness.  Do not use hot tubs, steam rooms, or saunas.  Wear your seat belt at all times when driving.  Avoid raw meat, uncooked cheese, cat litter boxes, and soil used by cats. These carry germs that can cause birth defects in the baby.  Take your prenatal vitamins.  Take 1500-2000 mg of calcium daily starting at the 20th week of pregnancy until you deliver your baby.  Try taking a stool softener (if your caregiver approves) if you develop constipation. Eat more high-fiber foods, such as fresh vegetables or fruit and whole grains. Drink plenty of fluids to keep your urine clear or pale yellow.  Take warm sitz baths to soothe any pain or discomfort caused by hemorrhoids. Use hemorrhoid cream if your caregiver  approves.  If you develop varicose veins, wear support hose. Elevate your feet for 15 minutes, 3-4 times a day. Limit salt in your diet.  Avoid heavy lifting, wear low heal shoes, and practice good posture.  Rest a lot with your legs elevated if you have leg cramps or low back pain.  Visit your dentist if you have not gone during your pregnancy. Use a soft toothbrush to brush your teeth and be gentle when you floss.  A sexual relationship may be continued unless your caregiver directs you otherwise.  Do not travel far distances unless it is absolutely necessary and only with the approval of your caregiver.  Take prenatal classes to understand, practice, and ask questions about the labor and delivery.  Make a trial run to the hospital.  Pack your hospital bag.  Prepare the baby's nursery.  Continue to go to all your prenatal visits as directed by your caregiver. SEEK MEDICAL CARE IF:  You are unsure if you are in labor or if your water has broken.  You have dizziness.  You have   mild pelvic cramps, pelvic pressure, or nagging pain in your abdominal area.  You have persistent nausea, vomiting, or diarrhea.  You have a bad smelling vaginal discharge.  You have pain with urination. SEEK IMMEDIATE MEDICAL CARE IF:   You have a fever.  You are leaking fluid from your vagina.  You have spotting or bleeding from your vagina.  You have severe abdominal cramping or pain.  You have rapid weight loss or gain.  You have shortness of breath with chest pain.  You notice sudden or extreme swelling of your face, hands, ankles, feet, or legs.  You have not felt your baby move in over an hour.  You have severe headaches that do not go away with medicine.  You have vision changes.   This information is not intended to replace advice given to you by your health care provider. Make sure you discuss any questions you have with your health care provider.   Document Released:  07/13/2001 Document Revised: 08/09/2014 Document Reviewed: 09/19/2012 Elsevier Interactive Patient Education 2016 [employer]  Breastfeeding Deciding to breastfeed is one of the best choices you can make for you and your baby. A change in hormones during pregnancy causes your breast tissue to grow and increases the number and size of your milk ducts. These hormones also allow proteins, sugars, and fats from your blood supply to make breast milk in your milk-producing glands. Hormones prevent breast milk from being released before your baby is born as well as prompt milk flow after birth. Once breastfeeding has begun, thoughts of your baby, as well as his or her sucking or crying, can stimulate the release of milk from your milk-producing glands.  BENEFITS OF BREASTFEEDING For Your Baby  Your first milk (colostrum) helps your baby's digestive system function better.  There are antibodies in your milk that help your baby fight off infections.  Your baby has a lower incidence of asthma, allergies, and sudden infant death syndrome.  The nutrients in breast milk are better for your baby than infant formulas and are designed uniquely for your baby's needs.  Breast milk improves your baby's brain development.  Your baby is less likely to develop other conditions, such as childhood obesity, asthma, or type 2 diabetes mellitus. For You  Breastfeeding helps to create a very special bond between you and your baby.  Breastfeeding is convenient. Breast milk is always available at the correct temperature and costs nothing.  Breastfeeding helps to burn calories and helps you lose the weight gained during pregnancy.  Breastfeeding makes your uterus contract to its prepregnancy size faster and slows bleeding (lochia) after you give birth.   Breastfeeding helps to lower your risk of developing type 2 diabetes mellitus, osteoporosis, and breast or ovarian cancer later in life. SIGNS THAT YOUR BABY IS  HUNGRY Early Signs of Hunger  Increased alertness or activity.  Stretching.  Movement of the head from side to side.  Movement of the head and opening of the mouth when the corner of the mouth or cheek is stroked (rooting).  Increased sucking sounds, smacking lips, cooing, sighing, or squeaking.  Hand-to-mouth movements.  Increased sucking of fingers or hands. Late Signs of Hunger  Fussing.  Intermittent crying. Extreme Signs of Hunger Signs of extreme hunger will require calming and consoling before your baby will be able to breastfeed successfully. Do not wait for the following signs of extreme hunger to occur before you initiate breastfeeding:  Restlessness.  A loud, strong cry.  Screaming.   BREASTFEEDING BASICS Breastfeeding Initiation  Find a comfortable place to sit or lie down, with your neck and back well supported.  Place a pillow or rolled up blanket under your baby to bring him or her to the level of your breast (if you are seated). Nursing pillows are specially designed to help support your arms and your baby while you breastfeed.  Make sure that your baby's abdomen is facing your abdomen.  Gently massage your breast. With your fingertips, massage from your chest wall toward your nipple in a circular motion. This encourages milk flow. You may need to continue this action during the feeding if your milk flows slowly.  Support your breast with 4 fingers underneath and your thumb above your nipple. Make sure your fingers are well away from your nipple and your baby's mouth.  Stroke your baby's lips gently with your finger or nipple.  When your baby's mouth is open wide enough, quickly bring your baby to your breast, placing your entire nipple and as much of the colored area around your nipple (areola) as possible into your baby's mouth.  More areola should be visible above your baby's upper lip than below the lower lip.  Your baby's tongue should be between his  or her lower gum and your breast.  Ensure that your baby's mouth is correctly positioned around your nipple (latched). Your baby's lips should create a seal on your breast and be turned out (everted).  It is common for your baby to suck about 2-3 minutes in order to start the flow of breast milk. Latching Teaching your baby how to latch on to your breast properly is very important. An improper latch can cause nipple pain and decreased milk supply for you and poor weight gain in your baby. Also, if your baby is not latched onto your nipple properly, he or she may swallow some air during feeding. This can make your baby fussy. Burping your baby when you switch breasts during the feeding can help to get rid of the air. However, teaching your baby to latch on properly is still the best way to prevent fussiness from swallowing air while breastfeeding. Signs that your baby has successfully latched on to your nipple:  Silent tugging or silent sucking, without causing you pain.  Swallowing heard between every 3-4 sucks.  Muscle movement above and in front of his or her ears while sucking. Signs that your baby has not successfully latched on to nipple:  Sucking sounds or smacking sounds from your baby while breastfeeding.  Nipple pain. If you think your baby has not latched on correctly, slip your finger into the corner of your baby's mouth to break the suction and place it between your baby's gums. Attempt breastfeeding initiation again. Signs of Successful Breastfeeding Signs from your baby:  A gradual decrease in the number of sucks or complete cessation of sucking.  Falling asleep.  Relaxation of his or her body.  Retention of a small amount of milk in his or her mouth.  Letting go of your breast by himself or herself. Signs from you:  Breasts that have increased in firmness, weight, and size 1-3 hours after feeding.  Breasts that are softer immediately after  breastfeeding.  Increased milk volume, as well as a change in milk consistency and color by the fifth day of breastfeeding.  Nipples that are not sore, cracked, or bleeding. Signs That Your Baby is Getting Enough Milk  Wetting at least 3 diapers in a 24-hour period.   The urine should be clear and pale yellow by age 5 days.  At least 3 stools in a 24-hour period by age 5 days. The stool should be soft and yellow.  At least 3 stools in a 24-hour period by age 7 days. The stool should be seedy and yellow.  No loss of weight greater than 10% of birth weight during the first 3 days of age.  Average weight gain of 4-7 ounces (113-198 g) per week after age 4 days.  Consistent daily weight gain by age 5 days, without weight loss after the age of 2 weeks. After a feeding, your baby may spit up a small amount. This is common. BREASTFEEDING FREQUENCY AND DURATION Frequent feeding will help you make more milk and can prevent sore nipples and breast engorgement. Breastfeed when you feel the need to reduce the fullness of your breasts or when your baby shows signs of hunger. This is called "breastfeeding on demand." Avoid introducing a pacifier to your baby while you are working to establish breastfeeding (the first 4-6 weeks after your baby is born). After this time you may choose to use a pacifier. Research has shown that pacifier use during the first year of a baby's life decreases the risk of sudden infant death syndrome (SIDS). Allow your baby to feed on each breast as long as he or she wants. Breastfeed until your baby is finished feeding. When your baby unlatches or falls asleep while feeding from the first breast, offer the second breast. Because newborns are often sleepy in the first few weeks of life, you may need to awaken your baby to get him or her to feed. Breastfeeding times will vary from baby to baby. However, the following rules can serve as a guide to help you ensure that your baby is  properly fed:  Newborns (babies 4 weeks of age or younger) may breastfeed every 1-3 hours.  Newborns should not go longer than 3 hours during the day or 5 hours during the night without breastfeeding.  You should breastfeed your baby a minimum of 8 times in a 24-hour period until you begin to introduce solid foods to your baby at around 6 months of age. BREAST MILK PUMPING Pumping and storing breast milk allows you to ensure that your baby is exclusively fed your breast milk, even at times when you are unable to breastfeed. This is especially important if you are going back to work while you are still breastfeeding or when you are not able to be present during feedings. Your lactation consultant can give you guidelines on how long it is safe to store breast milk. A breast pump is a machine that allows you to pump milk from your breast into a sterile bottle. The pumped breast milk can then be stored in a refrigerator or freezer. Some breast pumps are operated by hand, while others use electricity. Ask your lactation consultant which type will work best for you. Breast pumps can be purchased, but some hospitals and breastfeeding support groups lease breast pumps on a monthly basis. A lactation consultant can teach you how to hand express breast milk, if you prefer not to use a pump. CARING FOR YOUR BREASTS WHILE YOU BREASTFEED Nipples can become dry, cracked, and sore while breastfeeding. The following recommendations can help keep your breasts moisturized and healthy:  Avoid using soap on your nipples.  Wear a supportive bra. Although not required, special nursing bras and tank tops are designed to allow access to your   breasts for breastfeeding without taking off your entire bra or top. Avoid wearing underwire-style bras or extremely tight bras.  Air dry your nipples for 3-4minutes after each feeding.  Use only cotton bra pads to absorb leaked breast milk. Leaking of breast milk between feedings  is normal.  Use lanolin on your nipples after breastfeeding. Lanolin helps to maintain your skin's normal moisture barrier. If you use pure lanolin, you do not need to wash it off before feeding your baby again. Pure lanolin is not toxic to your baby. You may also hand express a few drops of breast milk and gently massage that milk into your nipples and allow the milk to air dry. In the first few weeks after giving birth, some women experience extremely full breasts (engorgement). Engorgement can make your breasts feel heavy, warm, and tender to the touch. Engorgement peaks within 3-5 days after you give birth. The following recommendations can help ease engorgement:  Completely empty your breasts while breastfeeding or pumping. You may want to start by applying warm, moist heat (in the shower or with warm water-soaked hand towels) just before feeding or pumping. This increases circulation and helps the milk flow. If your baby does not completely empty your breasts while breastfeeding, pump any extra milk after he or she is finished.  Wear a snug bra (nursing or regular) or tank top for 1-2 days to signal your body to slightly decrease milk production.  Apply ice packs to your breasts, unless this is too uncomfortable for you.  Make sure that your baby is latched on and positioned properly while breastfeeding. If engorgement persists after 48 hours of following these recommendations, contact your health care provider or a lactation consultant. OVERALL HEALTH CARE RECOMMENDATIONS WHILE BREASTFEEDING  Eat healthy foods. Alternate between meals and snacks, eating 3 of each per day. Because what you eat affects your breast milk, some of the foods may make your baby more irritable than usual. Avoid eating these foods if you are sure that they are negatively affecting your baby.  Drink milk, fruit juice, and water to satisfy your thirst (about 10 glasses a day).  Rest often, relax, and continue to take  your prenatal vitamins to prevent fatigue, stress, and anemia.  Continue breast self-awareness checks.  Avoid chewing and smoking tobacco. Chemicals from cigarettes that pass into breast milk and exposure to secondhand smoke may harm your baby.  Avoid alcohol and drug use, including marijuana. Some medicines that may be harmful to your baby can pass through breast milk. It is important to ask your health care provider before taking any medicine, including all over-the-counter and prescription medicine as well as vitamin and herbal supplements. It is possible to become pregnant while breastfeeding. If birth control is desired, ask your health care provider about options that will be safe for your baby. SEEK MEDICAL CARE IF:  You feel like you want to stop breastfeeding or have become frustrated with breastfeeding.  You have painful breasts or nipples.  Your nipples are cracked or bleeding.  Your breasts are red, tender, or warm.  You have a swollen area on either breast.  You have a fever or chills.  You have nausea or vomiting.  You have drainage other than breast milk from your nipples.  Your breasts do not become full before feedings by the fifth day after you give birth.  You feel sad and depressed.  Your baby is too sleepy to eat well.  Your baby is having trouble sleeping.     Your baby is wetting less than 3 diapers in a 24-hour period.  Your baby has less than 3 stools in a 24-hour period.  Your baby's skin or the white part of his or her eyes becomes yellow.   Your baby is not gaining weight by 5 days of age. SEEK IMMEDIATE MEDICAL CARE IF:  Your baby is overly tired (lethargic) and does not want to wake up and feed.  Your baby develops an unexplained fever.   This information is not intended to replace advice given to you by your health care provider. Make sure you discuss any questions you have with your health care provider.   Document Released: 07/19/2005  Document Revised: 04/09/2015 Document Reviewed: 01/10/2013 Elsevier Interactive Patient Education 2016 [employer]  

## 2016-03-03 LAB — URINE CYTOLOGY ANCILLARY ONLY
CHLAMYDIA, DNA PROBE: NEGATIVE
NEISSERIA GONORRHEA: NEGATIVE

## 2016-03-03 LAB — CULTURE, BETA STREP (GROUP B ONLY)

## 2016-03-11 ENCOUNTER — Encounter: Payer: Medicaid Other | Admitting: Advanced Practice Midwife

## 2016-03-12 ENCOUNTER — Ambulatory Visit (INDEPENDENT_AMBULATORY_CARE_PROVIDER_SITE_OTHER): Payer: Medicaid Other | Admitting: Obstetrics & Gynecology

## 2016-03-12 VITALS — BP 110/66 | HR 102 | Wt 195.0 lb

## 2016-03-12 DIAGNOSIS — D069 Carcinoma in situ of cervix, unspecified: Secondary | ICD-10-CM

## 2016-03-12 DIAGNOSIS — Z3483 Encounter for supervision of other normal pregnancy, third trimester: Secondary | ICD-10-CM

## 2016-03-12 DIAGNOSIS — O0943 Supervision of pregnancy with grand multiparity, third trimester: Secondary | ICD-10-CM

## 2016-03-12 DIAGNOSIS — B181 Chronic viral hepatitis B without delta-agent: Secondary | ICD-10-CM

## 2016-03-12 NOTE — Progress Notes (Signed)
110/66

## 2016-03-12 NOTE — Progress Notes (Signed)
Subjective:  Stacey Carrillo is a 25 y.o. [phone] at 7862w5d being seen today for ongoing prenatal care.  She is currently monitored for the following issues for this low-risk pregnancy and has Hepatitis B carrier; Supervision of normal pregnancy, antepartum; Cervical dysplasia affecting pregnancy, antepartum; Severe dysplasia of cervix (CIN III); and Grand multiparity with current pregnancy, antepartum on her problem list.  Patient reports no complaints.  Contractions: Irregular. Vag. Bleeding: None.  Movement: Present. Denies leaking of fluid.   The following portions of the patient's history were reviewed and updated as appropriate: allergies, current medications, past family history, past medical history, past social history, past surgical history and problem list. Problem list updated.  Objective:   Vitals:   03/12/16 1051  BP: 110/66  Pulse: (!) 102  Weight: 195 lb (88.5 kg)    Fetal Status: Fetal Heart Rate (bpm): 130   Movement: Present     General:  Alert, oriented and cooperative. Patient is in no acute distress.  Skin: Skin is warm and dry. No rash noted.   Cardiovascular: Normal heart rate noted  Respiratory: Normal respiratory effort, no problems with respiration noted  Abdomen: Soft, gravid, appropriate for gestational age. Pain/Pressure: Present     Pelvic:  Cervical exam performed        Extremities: Normal range of motion.  Edema: None  Mental Status: Normal mood and affect. Normal behavior. Normal judgment and thought content.   Urinalysis: Urine Protein: Trace Urine Glucose: Negative  Assessment and Plan:  Pregnancy: [id number] at 6762w5d  1. Supervision of normal pregnancy, antepartum, third trimester   2. Severe dysplasia of cervix (CIN III)   3. Hepatitis B carrier   4. Grand multiparity with current pregnancy, antepartum, third trimester   Term labor symptoms and general obstetric precautions including but not limited to vaginal bleeding, contractions, leaking  of fluid and fetal movement were reviewed in detail with the patient. Please refer to After Visit Summary for other counseling recommendations.  No Follow-up on file.   Stacey BossierMyra C Arasely Akkerman, MD

## 2016-03-19 ENCOUNTER — Encounter: Payer: Medicaid Other | Admitting: Family Medicine

## 2016-03-19 ENCOUNTER — Encounter (HOSPITAL_COMMUNITY): Payer: Self-pay

## 2016-03-19 ENCOUNTER — Inpatient Hospital Stay (HOSPITAL_COMMUNITY)
Admission: AD | Admit: 2016-03-19 | Discharge: 2016-03-21 | DRG: 774 | Disposition: A | Discharge status: Home / Self Care | Payer: Medicaid Other | Source: Ambulatory Visit | Attending: Obstetrics and Gynecology | Admitting: Obstetrics and Gynecology

## 2016-03-19 DIAGNOSIS — O99334 Smoking (tobacco) complicating childbirth: Secondary | ICD-10-CM | POA: Diagnosis present

## 2016-03-19 DIAGNOSIS — O9842 Viral hepatitis complicating childbirth: Secondary | ICD-10-CM | POA: Diagnosis present

## 2016-03-19 DIAGNOSIS — Z3490 Encounter for supervision of normal pregnancy, unspecified, unspecified trimester: Secondary | ICD-10-CM

## 2016-03-19 DIAGNOSIS — Z86001 Personal history of in-situ neoplasm of cervix uteri: Secondary | ICD-10-CM

## 2016-03-19 DIAGNOSIS — Z3A38 38 weeks gestation of pregnancy: Secondary | ICD-10-CM

## 2016-03-19 DIAGNOSIS — O99824 Streptococcus B carrier state complicating childbirth: Secondary | ICD-10-CM | POA: Diagnosis present

## 2016-03-19 DIAGNOSIS — B181 Chronic viral hepatitis B without delta-agent: Secondary | ICD-10-CM | POA: Diagnosis present

## 2016-03-19 DIAGNOSIS — D069 Carcinoma in situ of cervix, unspecified: Secondary | ICD-10-CM

## 2016-03-19 DIAGNOSIS — O0943 Supervision of pregnancy with grand multiparity, third trimester: Secondary | ICD-10-CM

## 2016-03-19 DIAGNOSIS — Z3483 Encounter for supervision of other normal pregnancy, third trimester: Secondary | ICD-10-CM

## 2016-03-19 DIAGNOSIS — F17210 Nicotine dependence, cigarettes, uncomplicated: Secondary | ICD-10-CM | POA: Diagnosis present

## 2016-03-19 LAB — CBC
HEMATOCRIT: 36.5 % (ref 36.0–46.0)
Hemoglobin: 12.3 g/dL (ref 12.0–15.0)
MCH: 25.2 pg — AB (ref 26.0–34.0)
MCHC: 33.7 g/dL (ref 30.0–36.0)
MCV: 74.8 fL — ABNORMAL LOW (ref 78.0–100.0)
Platelets: 144 10*3/uL — ABNORMAL LOW (ref 150–400)
RBC: 4.88 MIL/uL (ref 3.87–5.11)
RDW: 15.7 % — AB (ref 11.5–15.5)
WBC: 9.7 10*3/uL (ref 4.0–10.5)

## 2016-03-19 LAB — TYPE AND SCREEN
ABO/RH(D): A POS
ANTIBODY SCREEN: NEGATIVE

## 2016-03-19 MED ORDER — EPHEDRINE 5 MG/ML INJ
10.0000 mg | INTRAVENOUS | Status: DC | PRN
  Filled 2016-03-19: qty 4

## 2016-03-19 MED ORDER — ONDANSETRON HCL 4 MG/2ML IJ SOLN: 4.0000 mg | INTRAMUSCULAR | Status: DC | PRN

## 2016-03-19 MED ORDER — TETANUS-DIPHTH-ACELL PERTUSSIS 5-2.5-18.5 LF-MCG/0.5 IM SUSP: 0.5000 mL | Freq: Once | INTRAMUSCULAR | Status: DC

## 2016-03-19 MED ORDER — OXYTOCIN BOLUS FROM INFUSION
500.0000 mL | Freq: Once | INTRAVENOUS | Status: AC
  Administered 2016-03-19: 500 mL via INTRAVENOUS

## 2016-03-19 MED ORDER — DIPHENHYDRAMINE HCL 50 MG/ML IJ SOLN: 12.5000 mg | INTRAMUSCULAR | Status: DC | PRN

## 2016-03-19 MED ORDER — ONDANSETRON HCL 4 MG/2ML IJ SOLN: 4.0000 mg | Freq: Four times a day (QID) | INTRAMUSCULAR | Status: DC | PRN

## 2016-03-19 MED ORDER — SODIUM CHLORIDE 0.9 % IV SOLN
2.0000 g | Freq: Once | INTRAVENOUS | Status: AC
  Administered 2016-03-19: 2 g via INTRAVENOUS
  Filled 2016-03-19: qty 2000

## 2016-03-19 MED ORDER — SODIUM CHLORIDE 0.9 % IV SOLN: 250.0000 mL | INTRAVENOUS | Status: DC | PRN

## 2016-03-19 MED ORDER — COCONUT OIL OIL: 1.0000 "application " | TOPICAL_OIL | Status: DC | PRN

## 2016-03-19 MED ORDER — OXYCODONE-ACETAMINOPHEN 5-325 MG PO TABS: 2.0000 | ORAL_TABLET | ORAL | Status: DC | PRN

## 2016-03-19 MED ORDER — ACETAMINOPHEN 325 MG PO TABS
650.0000 mg | ORAL_TABLET | ORAL | Status: DC | PRN
  Administered 2016-03-19: 650 mg via ORAL
  Filled 2016-03-19: qty 2

## 2016-03-19 MED ORDER — MISOPROSTOL 200 MCG PO TABS
ORAL_TABLET | ORAL | Status: AC
  Filled 2016-03-19: qty 1

## 2016-03-19 MED ORDER — WITCH HAZEL-GLYCERIN EX PADS: 1.0000 "application " | MEDICATED_PAD | CUTANEOUS | Status: DC | PRN

## 2016-03-19 MED ORDER — DIPHENHYDRAMINE HCL 25 MG PO CAPS: 25.0000 mg | ORAL_CAPSULE | Freq: Four times a day (QID) | ORAL | Status: DC | PRN

## 2016-03-19 MED ORDER — LACTATED RINGERS IV SOLN: 500.0000 mL | INTRAVENOUS | Status: DC | PRN

## 2016-03-19 MED ORDER — OXYTOCIN 40 UNITS IN LACTATED RINGERS INFUSION - SIMPLE MED: 2.5000 [IU]/h | INTRAVENOUS | Status: DC | PRN

## 2016-03-19 MED ORDER — FENTANYL CITRATE (PF) 100 MCG/2ML IJ SOLN: 50.0000 ug | INTRAMUSCULAR | Status: DC | PRN

## 2016-03-19 MED ORDER — FENTANYL 2.5 MCG/ML BUPIVACAINE 1/10 % EPIDURAL INFUSION (WH - ANES): 14.0000 mL/h | INTRAMUSCULAR | Status: DC | PRN

## 2016-03-19 MED ORDER — PHENYLEPHRINE 40 MCG/ML (10ML) SYRINGE FOR IV PUSH (FOR BLOOD PRESSURE SUPPORT)
80.0000 ug | PREFILLED_SYRINGE | INTRAVENOUS | Status: DC | PRN
  Filled 2016-03-19: qty 5

## 2016-03-19 MED ORDER — ACETAMINOPHEN 325 MG PO TABS: 650.0000 mg | ORAL_TABLET | ORAL | Status: DC | PRN

## 2016-03-19 MED ORDER — SODIUM CHLORIDE 0.9% FLUSH: 3.0000 mL | INTRAVENOUS | Status: DC | PRN

## 2016-03-19 MED ORDER — OXYCODONE-ACETAMINOPHEN 5-325 MG PO TABS
1.0000 | ORAL_TABLET | Freq: Four times a day (QID) | ORAL | Status: DC | PRN
  Administered 2016-03-19 – 2016-03-21 (×3): 1 via ORAL
  Filled 2016-03-19 (×3): qty 1

## 2016-03-19 MED ORDER — PRENATAL MULTIVITAMIN CH
1.0000 | ORAL_TABLET | Freq: Every day | ORAL | Status: DC
  Administered 2016-03-20 – 2016-03-21 (×2): 1 via ORAL
  Filled 2016-03-19 (×2): qty 1

## 2016-03-19 MED ORDER — SIMETHICONE 80 MG PO CHEW: 80.0000 mg | CHEWABLE_TABLET | ORAL | Status: DC | PRN

## 2016-03-19 MED ORDER — OXYTOCIN 40 UNITS IN LACTATED RINGERS INFUSION - SIMPLE MED
2.5000 [IU]/h | INTRAVENOUS | Status: DC
  Filled 2016-03-19: qty 1000

## 2016-03-19 MED ORDER — OXYCODONE-ACETAMINOPHEN 5-325 MG PO TABS: 1.0000 | ORAL_TABLET | ORAL | Status: DC | PRN

## 2016-03-19 MED ORDER — BENZOCAINE-MENTHOL 20-0.5 % EX AERO: 1.0000 "application " | INHALATION_SPRAY | CUTANEOUS | Status: DC | PRN

## 2016-03-19 MED ORDER — LACTATED RINGERS IV SOLN: 500.0000 mL | Freq: Once | INTRAVENOUS | Status: DC

## 2016-03-19 MED ORDER — LIDOCAINE HCL (PF) 1 % IJ SOLN
30.0000 mL | INTRAMUSCULAR | Status: DC | PRN
  Filled 2016-03-19: qty 30

## 2016-03-19 MED ORDER — MISOPROSTOL 200 MCG PO TABS
200.0000 ug | ORAL_TABLET | Freq: Once | ORAL | Status: AC
  Administered 2016-03-19: 200 ug via RECTAL

## 2016-03-19 MED ORDER — LACTATED RINGERS IV SOLN
INTRAVENOUS | Status: DC
  Administered 2016-03-19: 11:00:00 via INTRAVENOUS

## 2016-03-19 MED ORDER — ZOLPIDEM TARTRATE 5 MG PO TABS: 5.0000 mg | ORAL_TABLET | Freq: Every evening | ORAL | Status: DC | PRN

## 2016-03-19 MED ORDER — IBUPROFEN 600 MG PO TABS
600.0000 mg | ORAL_TABLET | Freq: Four times a day (QID) | ORAL | Status: DC
  Administered 2016-03-19 – 2016-03-21 (×8): 600 mg via ORAL
  Filled 2016-03-19 (×7): qty 1

## 2016-03-19 MED ORDER — SENNOSIDES-DOCUSATE SODIUM 8.6-50 MG PO TABS
2.0000 | ORAL_TABLET | ORAL | Status: DC
  Administered 2016-03-20 – 2016-03-21 (×2): 2 via ORAL
  Filled 2016-03-19 (×2): qty 2

## 2016-03-19 MED ORDER — SOD CITRATE-CITRIC ACID 500-334 MG/5ML PO SOLN: 30.0000 mL | ORAL | Status: DC | PRN

## 2016-03-19 MED ORDER — SODIUM CHLORIDE 0.9% FLUSH
3.0000 mL | Freq: Two times a day (BID) | INTRAVENOUS | Status: DC
  Administered 2016-03-20: 3 mL via INTRAVENOUS

## 2016-03-19 MED ORDER — ONDANSETRON HCL 4 MG PO TABS: 4.0000 mg | ORAL_TABLET | ORAL | Status: DC | PRN

## 2016-03-19 MED ORDER — DIBUCAINE 1 % RE OINT: 1.0000 "application " | TOPICAL_OINTMENT | RECTAL | Status: DC | PRN

## 2016-03-19 NOTE — Lactation Note (Signed)
This note was copied from a baby's chart. Lactation Consultation Note  Patient Name: Stacey Carrillo Today's Date: 03/19/2016 Reason for consult: Initial assessment Breastfeeding consultation services and support information given and reviewed with mom.  She breastfed her previous babies.  Baby is 4 hours old and mom just gave formula due to painful latch.  Encouraged to watch for feeding cues and to call for latch assist.  Maternal Data Does the patient have breastfeeding experience prior to this delivery?: Yes  Feeding Feeding Type: Bottle Fed - Formula Nipple Type: Slow - flow  LATCH Score/Interventions                      Lactation Tools Discussed/Used     Consult Status Consult Status: Follow-up Date: 03/20/16 Follow-up type: In-patient    Huston FoleyMOULDEN, Jakai Onofre S 03/19/2016, 4:40 PM

## 2016-03-19 NOTE — MAU Note (Signed)
Pt c/o contractions every 5 minutes since 0800 this morning. Pt took a shower at 0915 and noticed bright red blood running down her legs. This happened once. Pt denies leaking of fluid. Pt states baby is moving normally.

## 2016-03-19 NOTE — H&P (Signed)
LABOR AND DELIVERY ADMISSION HISTORY AND PHYSICAL NOTE  Stacey Carrillo is a 25 y.o. female [phone] with IUP at 69w5dby LMP presenting for SOL.   Started having contractions at 8AM every 5 minutes. She reports positive fetal movement. She denies leakage of fluid. Admits to some minimal vaginal bleeding.  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 Abnormal Pap smear   . Anemia   . Group B streptococcal infection   . Hepatitis B   . HPV (human papilloma virus) infection   . Preterm labor     Past Surgical History: Past Surgical History:  Procedure Laterality Date  . DILATION AND CURETTAGE OF UTERUS      Obstetrical History: OB History    Gravida Para Term Preterm AB Living   _0 0 4 5   SAB TAB Ectopic Multiple Live Births   2 2 0 0 5      Social History: Social History   Social History  . Marital status: Single    Spouse name: N/A  . Number of children: N/A  . Years of education: N/A   Social History Main Topics  . Smoking status: Current Some Day Smoker    Packs/day: 0.25    Types: Cigarettes  . Smokeless tobacco: Never Used     Comment: pt stated she is trying to quite down to 1 ciggarette/day  . Alcohol use Yes     Comment: social  . Drug use: No  . Sexual activity: Yes    Partners: Male    Birth control/ protection: None   Other Topics Concern  . None   Social History Narrative  . None    Family History: Family History  Problem Relation Age of Onset  . Hearing loss Brother   . Asthma Son     Allergies: No Known Allergies  Prescriptions Prior to Admission  Medication Sig Dispense Refill Last Dose  . Prenatal Multivit-Min-Fe-FA (PRENATAL VITAMINS) 0.8 MG tablet Take 1 tablet by mouth daily. 30 tablet 12 Past Week at Unknown time  . hydrocortisone (PROCTOSOL HC) 2.5 % rectal cream Place 1 application rectally 2 (two) times daily.  (Patient not taking: Reported on 03/12/2016) 30 g 0 Not Taking at Unknown time     Review of Systems   All systems reviewed and negative except as stated in HPI  Blood pressure 130/73, pulse 89, temperature 98.2 F (36.8 C), temperature source Oral, resp. rate 20, height _1  (1.702 m), weight 195 lb (88.5 kg), last menstrual period 07/16/2015, not currently breastfeeding. General appearance: alert, cooperative, appears stated age and no distress Lungs: clear to auscultation bilaterally Heart: regular rate and rhythm Abdomen: soft, non-tender; bowel sounds normal Extremities: No calf swelling or tenderness Presentation: cephalic Fetal monitoring: 120, mod var, +accels, no decels Uterine activity: q3-5 min Dilation: 7 Effacement (%): 90 Station: -2, -1 Exam by:: LRonni Rumble RNC   Prenatal labs: ABO, Rh: --/--/A POS (08/18 1105) Antibody: NEG (08/18 1105) Rubella: !Error! RPR: NON REAC (06/06 1100)  HBsAg: Confirm. indicated (07/07 06269  HIV: NONREACTIVE (06/06 1100)  GBS: Positive (08/01 0000)  1 hr Glucola: 143, 3 hr GTT normal Genetic screening:  Neg Anatomy [place]  Normal- female  Prenatal Transfer Tool  Maternal Diabetes: No Genetic Screening: Normal Maternal Ultrasounds/Referrals: Normal Fetal Ultrasounds or other Referrals:  None Maternal Substance Abuse:  No Significant Maternal Medications:  None Significant  Maternal Lab Results: Lab values include: HBsAG positive, Other:   Results for orders placed or performed during the hospital encounter of 03/19/16 (from the past 24 hour(s))  CBC   Collection Time: 03/19/16 11:05 AM  Result Value Ref Range   WBC 9.7 4.0 - 10.5 K/uL   RBC 4.88 3.87 - 5.11 MIL/uL   Hemoglobin 12.3 12.0 - 15.0 g/dL   HCT 36.5 36.0 - 46.0 %   MCV 74.8 (L) 78.0 - 100.0 fL   MCH 25.2 (L) 26.0 - 34.0 pg   MCHC 33.7 30.0 - 36.0 g/dL   RDW 15.7 (H) 11.5 - 15.5 %   Platelets 144 (L) 150 - 400 K/uL  Type and screen [place]   Collection Time: 03/19/16 11:05 AM  Result Value Ref Range   ABO/RH(D) A POS    Antibody Screen NEG    Sample Expiration 03/22/2016     Patient Active Problem List   Diagnosis Date Noted  . Pregnancy 03/19/2016  . [place] multiparity with current pregnancy, antepartum 10/08/2015  . Severe dysplasia of cervix (CIN III) 09/25/2015  . Cervical dysplasia affecting pregnancy, antepartum 09/24/2015  . Supervision of normal pregnancy, antepartum 09/10/2015  . Hepatitis B carrier 05/18/2011    Assessment: Stacey Carrillo is a 25 y.o. [id number] at 12w5dhere for SOL.  #Labor: Expectant management, in active labor #Pain:  Epidural if requested #FWB:  Cat I #ID:  HepB+; GBS+ (start PCN); Rubella non-immune (MMR postpartum) #MOF: Breast #MOC: Depo #Circ:  N/A   EKatherine Basset DO 03/19/2016, 1:06 PM

## 2016-03-19 NOTE — Progress Notes (Signed)
UR chart review completed.  

## 2016-03-20 LAB — RPR: RPR Ser Ql: NONREACTIVE

## 2016-03-20 LAB — CBC
HCT: 32.2 % — ABNORMAL LOW (ref 36.0–46.0)
Hemoglobin: 11 g/dL — ABNORMAL LOW (ref 12.0–15.0)
MCH: 25.5 pg — ABNORMAL LOW (ref 26.0–34.0)
MCHC: 34.2 g/dL (ref 30.0–36.0)
MCV: 74.7 fL — ABNORMAL LOW (ref 78.0–100.0)
PLATELETS: 147 10*3/uL — AB (ref 150–400)
RBC: 4.31 MIL/uL (ref 3.87–5.11)
RDW: 15.7 % — AB (ref 11.5–15.5)
WBC: 9.6 10*3/uL (ref 4.0–10.5)

## 2016-03-20 NOTE — Progress Notes (Signed)
Post Partum Day 1  Subjective:  Stacey Carrillo is a 25 y.o. [id number] 8321w5d s/p SVD.  No acute events overnight.  Pt denies problems with ambulating, voiding or po intake.  She denies nausea or vomiting.  Pain is well controlled.  She has had flatus. She has had bowel movement.  Lochia Minimal.  Plan for birth control is Depo-Provera.  Method of Feeding: Breast  Objective: BP 106/63 (BP Location: Left Arm)   Pulse 76   Temp 98.2 F (36.8 C) (Oral)   Resp 18   Ht 5\' 7"  (1.702 m)   Wt 88.5 kg (195 lb)   LMP 07/16/2015   Breastfeeding? Unknown   BMI 30.54 kg/m   Physical Exam:  General: alert, cooperative and no distress Lochia:normal flow Chest: CTAB Heart: RRR no m/r/g Abdomen: +BS, soft, nontender, fundus firm at/below umbilicus Uterine Fundus: firm,  DVT Evaluation: No evidence of DVT seen on physical exam. Extremities: No edema   Recent Labs  03/19/16 1105 03/20/16 0553  HGB 12.3 11.0*  HCT 36.5 32.2*    Assessment/Plan:  ASSESSMENT: Stacey Carrillo is a 25 y.o. [id number] 4621w5d ppd #1 s/p SVD doing well.   Plan for discharge tomorrow   LOS: 1 day   Asiyah Z Mikell 03/20/2016, 9:02 AM   OB FELLOW POSTPARTUM PROGRESS NOTE ATTESTATION  I have seen and examined this patient and agree with above documentation in the resident's note.   Ernestina PennaNicholas Schenk, MD 9:40 AM

## 2016-03-21 MED ORDER — IBUPROFEN 600 MG PO TABS: 600.0000 mg | ORAL_TABLET | Freq: Four times a day (QID) | ORAL | 0 refills | Status: DC | PRN

## 2016-03-21 NOTE — Discharge Summary (Signed)
OB Discharge Summary     Patient Name: Stacey MoselleFatima Carrillo DOB: 08/28/1990 MRN: [id number]  Date of admission: 03/19/2016 Delivering MD: Hermina StaggersERVIN, MICHAEL L   Date of discharge: 03/21/2016  Admitting diagnosis: LABOR,BLEDING Intrauterine pregnancy: 2658w5d     Secondary diagnosis:  Active Problems:   Pregnancy  Additional problems: Chronic HepB+     Discharge diagnosis: Term Pregnancy Delivered                                                                                                Post partum procedures:none  Augmentation: none  Complications: None  Hospital course:  Onset of Labor With Vaginal Delivery     25 y.o. yo [id number] at 7258w5d was admitted in Active Labor on 03/19/2016. Patient had an uncomplicated labor course as follows:  Membrane Rupture Time/Date: 12:30 PM ,03/19/2016   Intrapartum Procedures: Episiotomy: None [1]                                         Lacerations:  None [1]  Patient had a delivery of a Viable infant. 03/19/2016  Information for the patient's newborn:  Catalina Pizzabu, Girl Chakita [[id number]][[id number]]  Delivery Method: Vaginal, Spontaneous Delivery (Filed from Delivery Summary)    Pateint had an uncomplicated postpartum course.  She is ambulating, tolerating a regular diet, passing flatus, and urinating well. Patient is discharged home in stable condition on 03/21/16.    Physical exam Vitals:   03/19/16 1922 03/20/16 0535 03/20/16 1837 03/21/16 0527  BP: 112/61 106/63 (!) 116/59 (!) 100/54  Pulse: 68 76 90 74  Resp: 20 18 18 18   Temp: 98.2 F (36.8 C) 98.2 F (36.8 C) 98.1 F (36.7 C) 97.7 F (36.5 C)  TempSrc: Oral Oral Oral Oral  SpO2:   100% 100%  Weight:      Height:       General: alert, cooperative and no distress Lochia: appropriate Uterine Fundus: firm Incision: N/A DVT Evaluation: No evidence of DVT seen on physical exam. Negative Homan's sign. No cords or calf tenderness. No significant calf/ankle edema. Labs: Lab Results  Component Value  Date   WBC 9.6 03/20/2016   HGB 11.0 (L) 03/20/2016   HCT 32.2 (L) 03/20/2016   MCV 74.7 (L) 03/20/2016   PLT 147 (L) 03/20/2016   CMP Latest Ref Rng & Units 02/04/2016  Glucose 65 - 99 mg/dL 81  BUN 7 - 25 mg/dL 5(L)  Creatinine 0.100.50 - 1.10 mg/dL 2.72(Z0.33(L)  Sodium 366135 - 440146 mmol/L 137  Potassium 3.5 - 5.3 mmol/L 3.9  Chloride 98 - 110 mmol/L 107  CO2 20 - 31 mmol/L 21  Calcium 8.6 - 10.2 mg/dL 8.7  Total Protein 6.1 - 8.1 g/dL 3.4(V5.7(L)  Total Bilirubin 0.2 - 1.2 mg/dL 0.3  Alkaline Phos 33 - 115 U/L 95  AST 10 - 30 U/L 16  ALT 6 - 29 U/L 13    Discharge instruction: per After Visit Summary and "Baby and Me Booklet".  After visit meds:  Medication List    TAKE these medications   hydrocortisone 2.5 % rectal cream Commonly known as:  PROCTOSOL HC Place 1 application rectally 2 (two) times daily.   ibuprofen 600 MG tablet Commonly known as:  ADVIL,MOTRIN Take 1 tablet (600 mg total) by mouth every 6 (six) hours as needed for mild pain, moderate pain or cramping.   Prenatal Vitamins 0.8 MG tablet Take 1 tablet by mouth daily.       Diet: routine diet  Activity: Advance as tolerated. Pelvic rest for 6 weeks.   Outpatient follow up:6 weeks Follow up Appt:No future appointments. Follow up Visit:No Follow-up on file.  Postpartum contraception: abstinence until depo  Newborn Data: Live born female  Birth Weight: 6 lb 15.8 oz (3170 g) APGAR: 9, 9  Baby Feeding: Breast Disposition:home with mother pending d/c from peds   03/21/2016 Marge DuncansBooker, Tatelyn Vanhecke Randall, CNM

## 2016-03-21 NOTE — Lactation Note (Signed)
This note was copied from a baby's chart. Lactation Consultation Note  Patient Name: Stacey Carrillo Date: 03/21/2016 Reason for consult: Follow-up assessment;Other (Comment);Infant weight loss (4% weight loss )  Baby is 15 hours old and has been consistent with feedings on the left , but not on the right due to inverted nipple , per mom and LC attempted with mom to latch in the right and no success due to inverted nipple and semi compressible areola. LC mentioned to mom the right will need some work . Per mom with her other babies the tissue eventually improved and she was able to feed on both breast. LC instructed mom on the use of shells and hand pump and checked #24 Flange good for today, but when the breast are fuller probably will need the #27 flange. DEBP kit given for when mom obtains a DEBP from [facility] . LC recommended to mom to call [facility] today and leave a message for need for DEBP and LC told mom a Pump request form would be faxed today . Moms milk is in bilaterally and breast are really ful , not engorged. Mom aware to hand express and pump down with a hand pump. Luckily baby latches well on the left and softened breast well. Sore nipple and engorgement prevention and tx reviewed.  Mother informed of post-discharge support and given phone number to the lactation department, including services for phone call assistance; out-patient appointments; and breastfeeding support group. List of other breastfeeding resources in the community given in the handout. Encouraged mother to call for problems or concerns related to breastfeeding. Baby still feeding at 18 mins with multiply swallows, increased with breast compressions. [facility] RN Stacey Carrillo aware of Stacey Carrillo plan.    Maternal Data Has patient been taught Hand Expression?: Yes  Feeding Feeding Type: Breast Fed (left , tried right inverted ) Length of feed:  (multiply swallows , increased with breast compression )  LATCH  Score/Interventions Latch: Grasps breast easily, tongue down, lips flanged, rhythmical sucking.  Audible Swallowing: Spontaneous and intermittent  Type of Nipple: Everted at rest and after stimulation  Comfort (Breast/Nipple): Filling, red/small blisters or bruises, mild/mod discomfort  Problem noted: Filling  Hold (Positioning): Assistance needed to correctly position infant at breast and maintain latch. Intervention(s): Breastfeeding basics reviewed;Support Pillows;Position options;Skin to skin  LATCH Score: 8  Lactation Tools Discussed/Used Tools: Shells;Pump;Flanges Flange Size: 27 (x2 ) Shell Type: Inverted Breast pump type: Manual WIC Program: Yes (per mom WIC [place] ) Pump Review: Setup, frequency, and cleaning;Milk Storage Initiated by:: MAI  Date initiated:: 03/21/16   Consult Status Consult Status: Complete Date: 03/21/16 Follow-up type: In-patient    Myer Haff 03/21/2016, 11:29 AM

## 2016-03-21 NOTE — Discharge Instructions (Signed)
NO SEX UNTIL AFTER YOU GET YOUR BIRTH CONTROL  ° °Postpartum Care After Vaginal Delivery °After you deliver your newborn (postpartum period), the usual stay in the hospital is 24-72 hours. If there were problems with your labor or delivery, or if you have other medical problems, you might be in the hospital longer.  °While you are in the hospital, you will receive help and instructions on how to care for yourself and your newborn during the postpartum period.  °While you are in the hospital: °· Be sure to tell your nurses if you have pain or discomfort, as well as where you feel the pain and what makes the pain worse. °· If you had an incision made near your vagina (episiotomy) or if you had some tearing during delivery, the nurses may put ice packs on your episiotomy or tear. The ice packs may help to reduce the pain and swelling. °· If you are breastfeeding, you may feel uncomfortable contractions of your uterus for a couple of weeks. This is normal. The contractions help your uterus get back to normal size. °· It is normal to have some bleeding after delivery. °· For the first 1-3 days after delivery, the flow is red and the amount may be similar to a period. °· It is common for the flow to start and stop. °· In the first few days, you may pass some small clots. Let your nurses know if you begin to pass large clots or your flow increases. °· Do not  flush blood clots down the toilet before having the nurse look at them. °· During the next 3-10 days after delivery, your flow should become more watery and pink or brown-tinged in color. °· Ten to fourteen days after delivery, your flow should be a small amount of yellowish-white discharge. °· The amount of your flow will decrease over the first few weeks after delivery. Your flow may stop in 6-8 weeks. Most women have had their flow stop by 12 weeks after delivery. °· You should change your sanitary pads frequently. °· Wash your hands thoroughly with soap and water  for at least 20 seconds after changing pads, using the toilet, or before holding or feeding your newborn. °· You should feel like you need to empty your bladder within the first 6-8 hours after delivery. °· In case you become weak, lightheaded, or faint, call your nurse before you get out of bed for the first time and before you take a shower for the first time. °· Within the first few days after delivery, your breasts may begin to feel tender and full. This is called engorgement. Breast tenderness usually goes away within 48-72 hours after engorgement occurs. You may also notice milk leaking from your breasts. If you are not breastfeeding, do not stimulate your breasts. Breast stimulation can make your breasts produce more milk. °· Spending as much time as possible with your newborn is very important. During this time, you and your newborn can feel close and get to know each other. Having your newborn stay in your room (rooming in) will help to strengthen the bond with your newborn.  It will give you time to get to know your newborn and become comfortable caring for your newborn. °· Your hormones change after delivery. Sometimes the hormone changes can temporarily cause you to feel sad or tearful. These feelings should not last more than a few days. If these feelings last longer than that, you should talk to your caregiver. °· If desired,   talk to your caregiver about methods of family planning or contraception. °· Talk to your caregiver about immunizations. Your caregiver may want you to have the following immunizations before leaving the hospital: °· Tetanus, diphtheria, and pertussis (Tdap) or tetanus and diphtheria (Td) immunization. It is very important that you and your family (including grandparents) or others caring for your newborn are up-to-date with the Tdap or Td immunizations. The Tdap or Td immunization can help protect your newborn from getting ill. °· Rubella immunization. °· Varicella (chickenpox)  immunization. °· Influenza immunization. You should receive this annual immunization if you did not receive the immunization during your pregnancy. °  °This information is not intended to replace advice given to you by your health care provider. Make sure you discuss any questions you have with your health care provider. °  °Document Released: 05/16/2007 Document Revised: 04/12/2012 Document Reviewed: 03/15/2012 °Elsevier Interactive Patient Education ©2016 [employer] ° °Breastfeeding °Deciding to breastfeed is one of the best choices you can make for you and your baby. A change in hormones during pregnancy causes your breast tissue to grow and increases the number and size of your milk ducts. These hormones also allow proteins, sugars, and fats from your blood supply to make breast milk in your milk-producing glands. Hormones prevent breast milk from being released before your baby is born as well as prompt milk flow after birth. Once breastfeeding has begun, thoughts of your baby, as well as his or her sucking or crying, can stimulate the release of milk from your milk-producing glands.  °BENEFITS OF BREASTFEEDING °For Your Baby °· Your first milk (colostrum) helps your baby's digestive system function better. °· There are antibodies in your milk that help your baby fight off infections. °· Your baby has a lower incidence of asthma, allergies, and sudden infant death syndrome. °· The nutrients in breast milk are better for your baby than infant formulas and are designed uniquely for your baby's needs. °· Breast milk improves your baby's brain development. °· Your baby is less likely to develop other conditions, such as childhood obesity, asthma, or type 2 diabetes mellitus. °For You °· Breastfeeding helps to create a very special bond between you and your baby. °· Breastfeeding is convenient. Breast milk is always available at the correct temperature and costs nothing. °· Breastfeeding helps to burn calories  and helps you lose the weight gained during pregnancy. °· Breastfeeding makes your uterus contract to its prepregnancy size faster and slows bleeding (lochia) after you give birth.   °· Breastfeeding helps to lower your risk of developing type 2 diabetes mellitus, osteoporosis, and breast or ovarian cancer later in life. °SIGNS THAT YOUR BABY IS HUNGRY °Early Signs of Hunger °· Increased alertness or activity. °· Stretching. °· Movement of the head from side to side. °· Movement of the head and opening of the mouth when the corner of the mouth or cheek is stroked (rooting). °· Increased sucking sounds, smacking lips, cooing, sighing, or squeaking. °· Hand-to-mouth movements. °· Increased sucking of fingers or hands. °Late Signs of Hunger °· Fussing. °· Intermittent crying. °Extreme Signs of Hunger °Signs of extreme hunger will require calming and consoling before your baby will be able to breastfeed successfully. Do not wait for the following signs of extreme hunger to occur before you initiate breastfeeding: °· Restlessness. °· A loud, strong cry. °· Screaming. °BREASTFEEDING BASICS °Breastfeeding Initiation °· Find a comfortable place to sit or lie down, with your neck and back well supported. °· Place   a pillow or rolled up blanket under your baby to bring him or her to the level of your breast (if you are seated). Nursing pillows are specially designed to help support your arms and your baby while you breastfeed. °· Make sure that your baby's abdomen is facing your abdomen. °· Gently massage your breast. With your fingertips, massage from your chest wall toward your nipple in a circular motion. This encourages milk flow. You may need to continue this action during the feeding if your milk flows slowly. °· Support your breast with 4 fingers underneath and your thumb above your nipple. Make sure your fingers are well away from your nipple and your baby's mouth. °· Stroke your baby's lips gently with your finger or  nipple. °· When your baby's mouth is open wide enough, quickly bring your baby to your breast, placing your entire nipple and as much of the colored area around your nipple (areola) as possible into your baby's mouth. °¨ More areola should be visible above your baby's upper lip than below the lower lip. °¨ Your baby's tongue should be between his or her lower gum and your breast. °· Ensure that your baby's mouth is correctly positioned around your nipple (latched). Your baby's lips should create a seal on your breast and be turned out (everted). °· It is common for your baby to suck about 2-3 minutes in order to start the flow of breast milk. °Latching °Teaching your baby how to latch on to your breast properly is very important. An improper latch can cause nipple pain and decreased milk supply for you and poor weight gain in your baby. Also, if your baby is not latched onto your nipple properly, he or she may swallow some air during feeding. This can make your baby fussy. Burping your baby when you switch breasts during the feeding can help to get rid of the air. However, teaching your baby to latch on properly is still the best way to prevent fussiness from swallowing air while breastfeeding. °Signs that your baby has successfully latched on to your nipple: °· Silent tugging or silent sucking, without causing you pain. °· Swallowing heard between every 3-4 sucks. °· Muscle movement above and in front of his or her ears while sucking. °Signs that your baby has not successfully latched on to nipple: °· Sucking sounds or smacking sounds from your baby while breastfeeding. °· Nipple pain. °If you think your baby has not latched on correctly, slip your finger into the corner of your baby's mouth to break the suction and place it between your baby's gums. Attempt breastfeeding initiation again. °Signs of Successful Breastfeeding °Signs from your baby: °· A gradual decrease in the number of sucks or complete cessation of  sucking. °· Falling asleep. °· Relaxation of his or her body. °· Retention of a small amount of milk in his or her mouth. °· Letting go of your breast by himself or herself. °Signs from you: °· Breasts that have increased in firmness, weight, and size 1-3 hours after feeding. °· Breasts that are softer immediately after breastfeeding. °· Increased milk volume, as well as a change in milk consistency and color by the fifth day of breastfeeding. °· Nipples that are not sore, cracked, or bleeding. °Signs That Your Baby is Getting Enough Milk °· Wetting at least 3 diapers in a 24-hour period. The urine should be clear and pale yellow by age 5 days. °· At least 3 stools in a 24-hour period by age   5 days. The stool should be soft and yellow. °· At least 3 stools in a 24-hour period by age 7 days. The stool should be seedy and yellow. °· No loss of weight greater than 10% of birth weight during the first 3 days of age. °· Average weight gain of 4-7 ounces (113-198 g) per week after age 4 days. °· Consistent daily weight gain by age 5 days, without weight loss after the age of 2 weeks. °After a feeding, your baby may spit up a small amount. This is common. °BREASTFEEDING FREQUENCY AND DURATION °Frequent feeding will help you make more milk and can prevent sore nipples and breast engorgement. Breastfeed when you feel the need to reduce the fullness of your breasts or when your baby shows signs of hunger. This is called "breastfeeding on demand." Avoid introducing a pacifier to your baby while you are working to establish breastfeeding (the first 4-6 weeks after your baby is born). After this time you may choose to use a pacifier. Research has shown that pacifier use during the first year of a baby's life decreases the risk of sudden infant death syndrome (SIDS). °Allow your baby to feed on each breast as long as he or she wants. Breastfeed until your baby is finished feeding. When your baby unlatches or falls asleep while  feeding from the first breast, offer the second breast. Because newborns are often sleepy in the first few weeks of life, you may need to awaken your baby to get him or her to feed. °Breastfeeding times will vary from baby to baby. However, the following rules can serve as a guide to help you ensure that your baby is properly fed: °· Newborns (babies 4 weeks of age or younger) may breastfeed every 1-3 hours. °· Newborns should not go longer than 3 hours during the day or 5 hours during the night without breastfeeding. °· You should breastfeed your baby a minimum of 8 times in a 24-hour period until you begin to introduce solid foods to your baby at around 6 months of age. °BREAST MILK PUMPING °Pumping and storing breast milk allows you to ensure that your baby is exclusively fed your breast milk, even at times when you are unable to breastfeed. This is especially important if you are going back to work while you are still breastfeeding or when you are not able to be present during feedings. Your lactation consultant can give you guidelines on how long it is safe to store breast milk. °A breast pump is a machine that allows you to pump milk from your breast into a sterile bottle. The pumped breast milk can then be stored in a refrigerator or freezer. Some breast pumps are operated by hand, while others use electricity. Ask your lactation consultant which type will work best for you. Breast pumps can be purchased, but some hospitals and breastfeeding support groups lease breast pumps on a monthly basis. A lactation consultant can teach you how to hand express breast milk, if you prefer not to use a pump. °CARING FOR YOUR BREASTS WHILE YOU BREASTFEED °Nipples can become dry, cracked, and sore while breastfeeding. The following recommendations can help keep your breasts moisturized and healthy: °· Avoid using soap on your nipples. °· Wear a supportive bra. Although not required, special nursing bras and tank tops are  designed to allow access to your breasts for breastfeeding without taking off your entire bra or top. Avoid wearing underwire-style bras or extremely tight bras. °· Air dry   your nipples for 3-4 minutes after each feeding. °· Use only cotton bra pads to absorb leaked breast milk. Leaking of breast milk between feedings is normal. °· Use lanolin on your nipples after breastfeeding. Lanolin helps to maintain your skin's normal moisture barrier. If you use pure lanolin, you do not need to wash it off before feeding your baby again. Pure lanolin is not toxic to your baby. You may also hand express a few drops of breast milk and gently massage that milk into your nipples and allow the milk to air dry. °In the first few weeks after giving birth, some women experience extremely full breasts (engorgement). Engorgement can make your breasts feel heavy, warm, and tender to the touch. Engorgement peaks within 3-5 days after you give birth. The following recommendations can help ease engorgement: °· Completely empty your breasts while breastfeeding or pumping. You may want to start by applying warm, moist heat (in the shower or with warm water-soaked hand towels) just before feeding or pumping. This increases circulation and helps the milk flow. If your baby does not completely empty your breasts while breastfeeding, pump any extra milk after he or she is finished. °· Wear a snug bra (nursing or regular) or tank top for 1-2 days to signal your body to slightly decrease milk production. °· Apply ice packs to your breasts, unless this is too uncomfortable for you. °· Make sure that your baby is latched on and positioned properly while breastfeeding. °If engorgement persists after 48 hours of following these recommendations, contact your health care provider or a lactation consultant. °OVERALL HEALTH CARE RECOMMENDATIONS WHILE BREASTFEEDING °· Eat healthy foods. Alternate between meals and snacks, eating 3 of each per day. Because  what you eat affects your breast milk, some of the foods may make your baby more irritable than usual. Avoid eating these foods if you are sure that they are negatively affecting your baby. °· Drink milk, fruit juice, and water to satisfy your thirst (about 10 glasses a day). °· Rest often, relax, and continue to take your prenatal vitamins to prevent fatigue, stress, and anemia. °· Continue breast self-awareness checks. °· Avoid chewing and smoking tobacco. Chemicals from cigarettes that pass into breast milk and exposure to secondhand smoke may harm your baby. °· Avoid alcohol and drug use, including marijuana. °Some medicines that may be harmful to your baby can pass through breast milk. It is important to ask your health care provider before taking any medicine, including all over-the-counter and prescription medicine as well as vitamin and herbal supplements. °It is possible to become pregnant while breastfeeding. If birth control is desired, ask your health care provider about options that will be safe for your baby. °SEEK MEDICAL CARE IF: °· You feel like you want to stop breastfeeding or have become frustrated with breastfeeding. °· You have painful breasts or nipples. °· Your nipples are cracked or bleeding. °· Your breasts are red, tender, or warm. °· You have a swollen area on either breast. °· You have a fever or chills. °· You have nausea or vomiting. °· You have drainage other than breast milk from your nipples. °· Your breasts do not become full before feedings by the fifth day after you give birth. °· You feel sad and depressed. °· Your baby is too sleepy to eat well. °· Your baby is having trouble sleeping.   °· Your baby is wetting less than 3 diapers in a 24-hour period. °· Your baby has less than 3 stools in   a 24-hour period. °· Your baby's skin or the white part of his or her eyes becomes yellow.   °· Your baby is not gaining weight by 5 days of age. °SEEK IMMEDIATE MEDICAL CARE IF: °· Your baby  is overly tired (lethargic) and does not want to wake up and feed. °· Your baby develops an unexplained fever. °  °This information is not intended to replace advice given to you by your health care provider. Make sure you discuss any questions you have with your health care provider. °  °Document Released: 07/19/2005 Document Revised: 04/09/2015 Document Reviewed: 01/10/2013 °Elsevier Interactive Patient Education ©2016 [employer] Inc. ° ° °

## 2016-03-25 ENCOUNTER — Encounter: Payer: Self-pay | Admitting: Obstetrics & Gynecology

## 2016-03-25 ENCOUNTER — Ambulatory Visit (INDEPENDENT_AMBULATORY_CARE_PROVIDER_SITE_OTHER): Payer: Medicaid Other | Admitting: Obstetrics & Gynecology

## 2016-03-25 VITALS — BP 116/70 | HR 75 | Resp 18 | Wt 185.0 lb

## 2016-03-25 DIAGNOSIS — O8612 Endometritis following delivery: Secondary | ICD-10-CM

## 2016-03-25 DIAGNOSIS — N719 Inflammatory disease of uterus, unspecified: Secondary | ICD-10-CM

## 2016-03-25 MED ORDER — AMOXICILLIN-POT CLAVULANATE 875-125 MG PO TABS: 1.0000 | ORAL_TABLET | Freq: Two times a day (BID) | ORAL | 1 refills | Status: DC

## 2016-03-25 NOTE — Progress Notes (Signed)
   Subjective:    Patient ID: Vassie MoselleFatima Husband, female    DOB: 08/11/1990, 25 y.o.   MRN: [id number]  HPI 25 yo AA lady 1 week pp s/p NSVD here with the complaint of a very sore uterus. She is breast feeding. She is taking IBU and Tylenol.   Review of Systems     Objective:   Physical Exam [id number] Breathing, conversing, and ambulating normally Abd- benign Fairly tender firm uterus at S+2       Assessment & Plan:  Postpartum endometritis- augmentin BID for 10 days RTC 1 week or MAU if worsening symptoms

## 2016-03-26 ENCOUNTER — Ambulatory Visit: Payer: Medicaid Other | Admitting: Family Medicine

## 2016-04-16 ENCOUNTER — Ambulatory Visit (INDEPENDENT_AMBULATORY_CARE_PROVIDER_SITE_OTHER): Payer: Medicaid Other | Admitting: Obstetrics and Gynecology

## 2016-04-16 ENCOUNTER — Encounter: Payer: Self-pay | Admitting: Obstetrics and Gynecology

## 2016-04-16 DIAGNOSIS — Z3042 Encounter for surveillance of injectable contraceptive: Secondary | ICD-10-CM

## 2016-04-16 DIAGNOSIS — Z3202 Encounter for pregnancy test, result negative: Secondary | ICD-10-CM | POA: Diagnosis not present

## 2016-04-16 LAB — POCT URINE PREGNANCY: Preg Test, Ur: NEGATIVE

## 2016-04-16 MED ORDER — MEDROXYPROGESTERONE ACETATE 150 MG/ML IM SUSP
150.0000 mg | INTRAMUSCULAR | Status: DC
  Administered 2016-04-16 – 2016-07-02 (×2): 150 mg via INTRAMUSCULAR

## 2016-04-16 MED ORDER — MEDROXYPROGESTERONE ACETATE 150 MG/ML IM SUSP: 150.0000 mg | Freq: Once | INTRAMUSCULAR | Status: DC

## 2016-04-16 NOTE — Addendum Note (Signed)
Addended by: Gita KudoLASSITER, Kyvon Hu S on: 04/16/2016 11:36 AM   Modules accepted: Orders

## 2016-04-16 NOTE — Progress Notes (Signed)
25 yo [id number] presenting today for followup on recent diagnosis of endometritis. Patient completed antibiotic rx. She states she feels well. She denies abdominal/pelvic pain. She denies fever/chills. Patient has not been sexually active  Past Medical History:  Diagnosis Date  . Abnormal Pap smear   . Anemia   . Group B streptococcal infection   . Hepatitis B   . HPV (human papilloma virus) infection   . Preterm labor    Past Surgical History:  Procedure Laterality Date  . DILATION AND CURETTAGE OF UTERUS     Family History  Problem Relation Age of Onset  . Hearing loss Brother   . Asthma Son    Social History  Substance Use Topics  . Smoking status: Current Some Day Smoker    Packs/day: 0.25    Types: Cigarettes  . Smokeless tobacco: Never Used     Comment: pt stated she is trying to quite down to 1 ciggarette/day  . Alcohol use Yes     Comment: social   ROS See pertinent in HPI  Blood pressure 118/80, resp. rate 18, height 5\' 7"  (1.702 m), weight 179 lb (81.2 kg), currently breastfeeding.  GENERAL: Well-developed, well-nourished female in no acute distress.  BREASTS: Symmetric in size. No palpable masses or lymphadenopathy, skin changes, or nipple drainage. ABDOMEN: Soft, nontender, nondistended. No organomegaly. EXTREMITIES: No cyanosis, clubbing, or edema, 2+ distal pulses.  A/P 25 yo s/p SVD on 8/18 with postpartum course complicated by endometritis - RTC for postpartum check - Patient desires depo-provera for contraception. First dose today. Her partner is planning on having a vasectomy - Patient denies any symptoms of depression

## 2016-04-28 ENCOUNTER — Ambulatory Visit: Payer: Medicaid Other | Admitting: Obstetrics & Gynecology

## 2016-05-13 ENCOUNTER — Ambulatory Visit (INDEPENDENT_AMBULATORY_CARE_PROVIDER_SITE_OTHER): Payer: Medicaid Other | Admitting: Obstetrics and Gynecology

## 2016-05-13 ENCOUNTER — Encounter: Payer: Self-pay | Admitting: Obstetrics and Gynecology

## 2016-05-13 VITALS — BP 120/77 | HR 80 | Ht 67.0 in | Wt 187.0 lb

## 2016-05-13 DIAGNOSIS — D069 Carcinoma in situ of cervix, unspecified: Secondary | ICD-10-CM

## 2016-05-13 DIAGNOSIS — B181 Chronic viral hepatitis B without delta-agent: Secondary | ICD-10-CM

## 2016-05-13 DIAGNOSIS — Z3201 Encounter for pregnancy test, result positive: Secondary | ICD-10-CM | POA: Insufficient documentation

## 2016-05-13 LAB — POCT URINE PREGNANCY: Preg Test, Ur: NEGATIVE

## 2016-05-13 NOTE — Progress Notes (Signed)
Post Partum Exam 05/13/2016  [facility]  Vassie MoselleFatima Woolum is a 25 y.o. [id number] female who presents for a postpartum visit. She is 8/18 postpartum following a spontaneous vaginal delivery/intact perineum. 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  She was diagnosed with PP endometritis on 8/24 and given augmentin and was doing well at her 9/15 check up.  She has had some slight AUB but nothing like a period and occasional cramps  Baby's course has been unremarkable. Baby is feeding by breast. Bleeding no bleeding. Bowel function is normal. Bladder function is normal. Patient is not sexually active. Contraception method is Depo-Provera injections, which she received on 9/15. Postpartum depression screening: neg  Review of Systems Pertinent items are noted in HPI.   Objective:    BP 116/78 mmHg  Pulse 78  Resp 16  Ht 5\' 5"  (1.651 m)  Wt 211 lb (95.709 kg)  BMI 35.11 kg/m2  Breastfeeding? Yes   NAD Abdomen: benign  Assessment:   Pt stable  Plan:   *PP: exam deferred today *CIN 3: pt not set up for LEEP yet, which she thought she was going to have today. She had cin 3 at her 09/2015 bx. Pt set up for ASAP LEEP and told importance of follow up for this *?+UPT, AUB: pt unable to void until after clinic and had ?faint +UPT. Patient called he states she didn't have sex since having the baby until a week after the depo was given on 9/15, which her UPT was negative then. We asked her to come in for beta quant and she states she'll be in tomorrow. Can consider embx at LEEP to eval for continued endometritis PRN *GI: doesn't have a provider. Referral placed.   RTC 10/31 for LEEP and pt to come in tomorrow for beta quant lab visit  Cornelia Copaharlie Shreeya Recendiz, Jr MD [facility]ending [facility](Faculty Practice)

## 2016-05-14 ENCOUNTER — Other Ambulatory Visit: Payer: Medicaid Other

## 2016-05-18 ENCOUNTER — Other Ambulatory Visit (INDEPENDENT_AMBULATORY_CARE_PROVIDER_SITE_OTHER): Payer: Medicaid Other | Admitting: *Deleted

## 2016-05-18 DIAGNOSIS — Z3201 Encounter for pregnancy test, result positive: Secondary | ICD-10-CM

## 2016-05-18 NOTE — Progress Notes (Signed)
Pt here today for BHCG due to faint + UPT in the office at last visit.

## 2016-05-19 ENCOUNTER — Telehealth: Payer: Self-pay | Admitting: *Deleted

## 2016-05-19 LAB — HCG, QUANTITATIVE, PREGNANCY

## 2016-05-19 NOTE — Telephone Encounter (Signed)
Called pt to adv neg hcg quant. Pt expressed understanding.

## 2016-05-19 NOTE — Telephone Encounter (Signed)
error 

## 2016-05-19 NOTE — Telephone Encounter (Signed)
-----   Message from Olevia BowensJacinda S Battle sent at 05/19/2016  1:11 PM EDT ----- Regarding: Lab Results Contact: [phone] Wants to know Beta Results

## 2016-06-01 ENCOUNTER — Encounter: Payer: Medicaid Other | Admitting: Obstetrics & Gynecology

## 2016-06-29 ENCOUNTER — Other Ambulatory Visit: Payer: Self-pay | Admitting: *Deleted

## 2016-06-29 ENCOUNTER — Encounter: Payer: Self-pay | Admitting: Internal Medicine

## 2016-06-29 ENCOUNTER — Ambulatory Visit (INDEPENDENT_AMBULATORY_CARE_PROVIDER_SITE_OTHER): Payer: Medicaid Other | Admitting: Internal Medicine

## 2016-06-29 VITALS — BP 107/72 | HR 83 | Temp 98.6°F | Wt 197.0 lb

## 2016-06-29 DIAGNOSIS — B181 Chronic viral hepatitis B without delta-agent: Secondary | ICD-10-CM | POA: Diagnosis present

## 2016-06-29 LAB — COMPLETE METABOLIC PANEL WITH GFR
ALT: 43 U/L — AB (ref 6–29)
AST: 22 U/L (ref 10–30)
Albumin: 4.5 g/dL (ref 3.6–5.1)
Alkaline Phosphatase: 54 U/L (ref 33–115)
BILIRUBIN TOTAL: 0.5 mg/dL (ref 0.2–1.2)
BUN: 11 mg/dL (ref 7–25)
CHLORIDE: 108 mmol/L (ref 98–110)
CO2: 23 mmol/L (ref 20–31)
CREATININE: 0.65 mg/dL (ref 0.50–1.10)
Calcium: 9.8 mg/dL (ref 8.6–10.2)
GFR, Est Non African American: >89 mL/min (ref >=60)
Glucose, Bld: 98 mg/dL (ref 65–99)
Potassium: 4.4 mmol/L (ref 3.5–5.3)
Sodium: 142 mmol/L (ref 135–146)
TOTAL PROTEIN: 7.2 g/dL (ref 6.1–8.1)

## 2016-06-29 NOTE — Progress Notes (Signed)
[facility]      Reason for Consult:Chronic hepatitis B    Referring Physician: Dr. Vergie LivingPickens    Patient ID: Stacey MoselleFatima Ficek, female    DOB: 12/11/1990, 25 y.o.   MRN: [id number]  HPI:   She has known hepatitis B, diagnosed during her recent pregnancy.  Labs done include hepatitis B surface Ag positive, hepatitis e Ag negative, hepatitis e Ab positive, hepatitis B DNA positive at just 93 IU/mL.  Normal transaminases.  No recent risk factors including no drug use, current partner negative by her report.  Thinks her father had hepatitis B but does not know her family.  No weight loss.  Does not recall any acute hepatitis illness.    Past Medical History:  Diagnosis Date  . Abnormal Pap smear   . Anemia   . Group B streptococcal infection   . Hepatitis B   . HPV (human papilloma virus) infection   . Preterm labor     Prior to Admission medications   Medication Sig Start Date End Date Taking? Authorizing Provider  ibuprofen (ADVIL,MOTRIN) 600 MG tablet Take 1 tablet (600 mg total) by mouth every 6 (six) hours as needed for mild pain, moderate pain or cramping. 03/21/16  Yes Cheral MarkerKimberly R Booker, CNM  Prenatal Multivit-Min-Fe-FA (PRENATAL VITAMINS) 0.8 MG tablet Take 1 tablet by mouth daily. 09/05/15  Yes Aviva SignsMarie L Williams, CNM    No Known Allergies  Social History  Substance Use Topics  . Smoking status: Current Some Day Smoker    Packs/day: 0.25    Types: Cigarettes  . Smokeless tobacco: Never Used     Comment: pt stated she is trying to quite down to 1 ciggarette/day  . Alcohol use Yes     Comment: social    Family History  Problem Relation Age of Onset  . Hearing loss Brother   . Asthma Son   no cirrhosis  Review of Systems  Constitutional: negative for fatigue and malaise Cardiovascular: negative for dyspnea Gastrointestinal: negative for diarrhea Musculoskeletal: negative for myalgias and arthralgias All other systems reviewed and are negative     Constitutional: in no apparent distress and alert  Vitals:   06/29/16 0914  BP: 107/72  Pulse: 83  Temp: 98.6 F (37 C)   EYES: anicteric ENMT: no thrush Cardiovascular: Cor RRR and No murmurs Respiratory: CTAB; normal respiratory effort GI: Bowel sounds are normal, liver is not enlarged, spleen is not enlarged Musculoskeletal: no pedal edema noted Skin: negatives: no rash Hematologic: no cervical lad  Labs: Lab Results  Component Value Date   WBC 9.6 03/20/2016   HGB 11.0 (L) 03/20/2016   HCT 32.2 (L) 03/20/2016   MCV 74.7 (L) 03/20/2016   PLT 147 (L) 03/20/2016    Lab Results  Component Value Date   CREATININE 0.33 (L) 02/04/2016   BUN 5 (L) 02/04/2016   NA 137 02/04/2016   K 3.9 02/04/2016   CL 107 02/04/2016   CO2 21 02/04/2016    Lab Results  Component Value Date   ALT 13 02/04/2016   AST 16 02/04/2016   ALKPHOS 95 02/04/2016   BILITOT 0.3 02/04/2016     Assessment: chronic hepatitis B.  I discussed the natural history of hepatitis B, modes of contact, treatments and lack of cure.  Her labs earlier this year suggest she is possibly converting to negative with a positive EAb and negative EAg.  She also could be a precore mutation active disease but I  would expect a higher viral DNA.   Plan: 1) repeat viral DNA, surface Ag, E Ab and Ag 2) elastography RTC in about 6 weeks after above

## 2016-06-30 LAB — HEPATITIS B SURFACE ANTIBODY,QUALITATIVE: HEP B S AB: NEGATIVE

## 2016-06-30 LAB — HEPATITIS A ANTIBODY, TOTAL: HEP A TOTAL AB: REACTIVE — AB

## 2016-06-30 LAB — HEPATITIS B SURF AG CONFIRMATION: Hepatitis B Surf Ag Confirmation: POSITIVE — AB

## 2016-06-30 LAB — HEPATITIS B SURFACE ANTIGEN: Hepatitis B Surface Ag: POSITIVE — AB

## 2016-07-01 LAB — HEPATITIS B E ANTIGEN: Hepatitis Be Antigen: NONREACTIVE

## 2016-07-01 LAB — HEPATITIS B E ANTIBODY: Hepatitis Be Antibody: REACTIVE — AB

## 2016-07-02 ENCOUNTER — Ambulatory Visit (INDEPENDENT_AMBULATORY_CARE_PROVIDER_SITE_OTHER): Payer: Medicaid Other | Admitting: *Deleted

## 2016-07-02 VITALS — BP 138/84 | HR 92 | Temp 98.4°F | Wt 199.0 lb

## 2016-07-02 DIAGNOSIS — Z3042 Encounter for surveillance of injectable contraceptive: Secondary | ICD-10-CM | POA: Diagnosis not present

## 2016-07-02 LAB — HEPATITIS B DNA, ULTRAQUANTITATIVE, PCR
Hepatitis B DNA (Calc): 2.48 Log IU/mL — ABNORMAL HIGH (ref <1.30)
Hepatitis B DNA: 303 IU/mL — ABNORMAL HIGH (ref <20)

## 2016-07-02 NOTE — Progress Notes (Signed)
Patient is in the office for Depo injection, administered and pt tolerated well. 

## 2016-07-13 ENCOUNTER — Ambulatory Visit (INDEPENDENT_AMBULATORY_CARE_PROVIDER_SITE_OTHER): Payer: Medicaid Other | Admitting: Obstetrics & Gynecology

## 2016-07-13 ENCOUNTER — Encounter: Payer: Self-pay | Admitting: Obstetrics & Gynecology

## 2016-07-13 VITALS — BP 115/77 | HR 103 | Resp 16 | Ht 62.0 in | Wt 200.0 lb

## 2016-07-13 DIAGNOSIS — Z3202 Encounter for pregnancy test, result negative: Secondary | ICD-10-CM

## 2016-07-13 DIAGNOSIS — D069 Carcinoma in situ of cervix, unspecified: Secondary | ICD-10-CM | POA: Diagnosis not present

## 2016-07-13 LAB — POCT URINE PREGNANCY: Preg Test, Ur: NEGATIVE

## 2016-07-13 NOTE — Progress Notes (Signed)
   GYNECOLOGY OFFICE PROCEDURE NOTE  Stacey Carrillo is a 25 y.o. 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 No GYN concerns. Pap smear and colposcopy reviewed.    Pap ASC-H 09/10/2015 Colpo Biopsy  CIN III on 09/24/15 ECC Not done secondary to pregnancy  Risks, benefits, alternatives, and limitations of procedure explained to patient, including pain, bleeding, infection, failure to remove abnormal tissue and failure to cure dysplasia, need for repeat procedures, damage to pelvic organs, cervical incompetence.  Role of HPV,cervical dysplasia and need for close followup was empasized. Informed written consent was obtained. All questions were answered. Time out performed. Urine pregnancy test was negative.  Procedure: The patient was placed in lithotomy position and the bivalved coated speculum was placed in the patient's vagina. A grounding pad placed on the patient. Lugol's solution was applied to the cervix and areas of decreased uptake were noted around the transformation zone.   Local anesthesia was administered via an intracervical block using 10 ml of 2% Lidocaine with epinephrine. The suction was turned on and the Medium 1X Fisher Cone Biopsy Excisor on 50 Watts of cutting current was used to excise the area of decreased uptake and excise the entire transformation zone. Excellent hemostasis was achieved using roller ball coagulation set at 50 Watts coagulation current. Monsel's solution was then applied and the speculum was removed from the vagina. Specimens were sent to pathology.  The patient tolerated the procedure well. Post-operative instructions given to patient, including instruction to seek medical attention for persistent bright red bleeding, fever, abdominal/pelvic pain, dysuria, nausea or vomiting. She was also told about the possibility of having copious yellow to black tinged discharge for weeks. She was counseled to avoid anything in the vagina  (sex/douching/tampons) for 3 weeks. She has a 4 week post-operative check to assess wound healing, review results and discuss further management.     Jaynie CollinsUGONNA  Laxmi Choung, MD, FACOG Attending Obstetrician & Gynecologist, [facility]

## 2016-07-13 NOTE — Patient Instructions (Signed)
Conization of the Cervix, Care After Refer to this sheet in the next few weeks. These instructions provide you with information on caring for yourself after your procedure. Your health care provider may also give you more specific instructions. Your treatment has been planned according to current medical practices but problems sometimes occur. Call your health care provider if you have any problems or questions after your procedure. WHAT TO EXPECT AFTER THE PROCEDURE After your procedure, it is typical to have the following sensations:  If you had a general anesthetic, you may be groggy for 2-3 hours after the procedure.  You may have cramps (similar to menstrual cramps) for about 1 week.   You may have a bloody discharge or light to moderate bleeding for 1-2 weeks. The bleeding should not be heavy (for example, it should not soak 1 pad in less than 1 hour).  You may have a black vaginal discharge that looks similar to coffee grounds. This is from the paste that was applied to the cervix to control bleeding. This is normal. Recovery may take up to 3 weeks.  HOME CARE INSTRUCTIONS   Arrange for someone to drive you home after the procedure.  Only take medicines as directed by your health care provider. Do not take aspirin. It can cause bleeding.   Take showers for the first week. Do not take baths, swim, or use hot tubs until your health care provider says it is okay.   Do not douche, use tampons, or have sexual intercourse until your health care provider says it is okay.   Avoid strenuous activities, exercises, and heavy lifting for at least 7-14 days.  You may resume your normal diet unless your health care provider advises you differently.    If you are constipated, you may:  Take a mild laxative as directed by your health care provider.   Add fruit and bran to your diet.   Make sure to drink enough fluids to keep your urine clear or pale yellow.  Keep follow-up  appointments with your health care provider. SEEK MEDICAL CARE IF:   You develop a rash.   You are dizzy or lightheaded.   You feel nauseous.   You develop a bad smelling vaginal discharge. SEEK IMMEDIATE MEDICAL CARE IF:   You have blood clots or bleeding that is heavier than a normal menstrual period (for example, soaking a pad in less than 1 hour) or you develop bright red bleeding.   You have a fever over 101F (38.3C) or persistent symptoms for more than 2-3 days.   You have a fever over 101F (38.3C) and your symptoms suddenly get worse.  You have increasing cramps.   You faint.   You have pain when urinating.  You have bloody urine.   You start vomiting.   Your pain is not relieved with your medicine.   Your have severe or worsening pain. MAKE SURE YOU:  Understand these instructions.  Will watch your condition.  Will get help right away if you are not doing well or get worse. This information is not intended to replace advice given to you by your health care provider. Make sure you discuss any questions you have with your health care provider. Document Released: 07/19/2005 Document Revised: 07/24/2013 Document Reviewed: 01/12/2013 Elsevier Interactive Patient Education  2017 [employer]

## 2016-07-14 ENCOUNTER — Encounter: Payer: Self-pay | Admitting: *Deleted

## 2016-08-10 ENCOUNTER — Ambulatory Visit: Payer: Medicaid Other | Admitting: Internal Medicine

## 2016-08-13 ENCOUNTER — Ambulatory Visit: Payer: Medicaid Other | Admitting: Obstetrics & Gynecology

## 2016-08-13 NOTE — Progress Notes (Deleted)
Follow up LEEP - SMALL FOCUS OF HIGH GRADE SQUAMOUS INTRAEPITHELIAL LESION (CIN-II, CIN-III) WITH NEGATIVE SURGICAL MARGINS OF RESECTION.

## 2016-08-24 ENCOUNTER — Ambulatory Visit: Payer: Medicaid Other | Admitting: Obstetrics & Gynecology

## 2016-09-03 ENCOUNTER — Ambulatory Visit (INDEPENDENT_AMBULATORY_CARE_PROVIDER_SITE_OTHER): Payer: Medicaid Other | Admitting: Obstetrics and Gynecology

## 2016-09-03 ENCOUNTER — Encounter: Payer: Self-pay | Admitting: Obstetrics and Gynecology

## 2016-09-03 VITALS — BP 128/83 | HR 99 | Resp 18 | Ht 67.0 in | Wt 208.0 lb

## 2016-09-03 DIAGNOSIS — Z9889 Other specified postprocedural states: Secondary | ICD-10-CM

## 2016-09-03 MED ORDER — ETONOGESTREL-ETHINYL ESTRADIOL 0.12-0.015 MG/24HR VA RING: VAGINAL_RING | VAGINAL | 12 refills | Status: DC

## 2016-09-03 NOTE — Progress Notes (Signed)
26 yo [id number] here for post op check s/p LEEP in December. Patient reports doing well since the procedure  Past Medical History:  Diagnosis Date  . Abnormal Pap smear   . Anemia   . Group B streptococcal infection   . Hepatitis B   . HPV (human papilloma virus) infection   . Preterm labor    Past Surgical History:  Procedure Laterality Date  . DILATION AND CURETTAGE OF UTERUS     Family History  Problem Relation Age of Onset  . Hearing loss Brother   . Asthma Son    Social History  Substance Use Topics  . Smoking status: Current Some Day Smoker    Packs/day: 0.25    Types: Cigarettes  . Smokeless tobacco: Never Used     Comment: pt stated she is trying to quite down to 1 ciggarette/day  . Alcohol use Yes     Comment: social   ROS See pertinent in HPI Blood pressure 128/83, pulse 99, resp. rate 18, height 5\' 7"  (1.702 m), weight 208 lb (94.3 kg), not currently breastfeeding. GENERAL: Well-developed, well-nourished female in no acute distress.  ABDOMEN: Soft, nontender, nondistended. No organomegaly. PELVIC: Normal external female genitalia. Vagina is pink and rugated.  Normal discharge. Normal appearing cervix. Uterus is normal in size. No adnexal mass or tenderness. EXTREMITIES: No cyanosis, clubbing, or edema, 2+ distal pulses.  A/P 26 yo here for post op check - Patient is cleared to have intercourse - Reviewed pathology results with her and plan for repeat pap smear in July - Patient desires to try NuvaRing in march instead of coming in for depo-provera. Rx provided - RTC in 5 months

## 2017-03-15 ENCOUNTER — Ambulatory Visit (INDEPENDENT_AMBULATORY_CARE_PROVIDER_SITE_OTHER): Payer: Medicaid Other

## 2017-03-15 ENCOUNTER — Encounter: Payer: Self-pay | Admitting: Obstetrics

## 2017-03-15 VITALS — BP 124/84 | HR 96 | Ht 67.0 in | Wt 213.0 lb

## 2017-03-15 DIAGNOSIS — Z3201 Encounter for pregnancy test, result positive: Secondary | ICD-10-CM

## 2017-03-15 DIAGNOSIS — Z3200 Encounter for pregnancy test, result unknown: Secondary | ICD-10-CM

## 2017-03-15 LAB — POCT URINE PREGNANCY: Preg Test, Ur: POSITIVE — AB

## 2017-03-15 NOTE — Progress Notes (Signed)
[name] presents today for UPT. She has no unusual complaints. LMP:01/24/17    OBJECTIVE: Appears well, in no apparent distress.  OB History    Gravida Para Term Preterm AB Living   11 6 6  0 4 6   SAB TAB Ectopic Multiple Live Births   2 2 0 0 6     Home UPT Result:   POSITIVE In-Office UPT result: POSITIVE I have reviewed the patient's medical, obstetrical, social, and family histories, and medications.   ASSESSMENT: Positive pregnancy test. 7w 1d. EDD 10/31/2017  PLAN Prenatal care to be completed at: [facility]

## 2017-04-04 ENCOUNTER — Inpatient Hospital Stay (HOSPITAL_COMMUNITY)
Admission: AD | Admit: 2017-04-04 | Discharge: 2017-04-04 | Disposition: A | Discharge status: Home / Self Care | Payer: Medicaid Other | Source: Ambulatory Visit | Attending: Family Medicine | Admitting: Family Medicine

## 2017-04-04 ENCOUNTER — Inpatient Hospital Stay (HOSPITAL_COMMUNITY): Payer: Medicaid Other

## 2017-04-04 DIAGNOSIS — Z79899 Other long term (current) drug therapy: Secondary | ICD-10-CM | POA: Insufficient documentation

## 2017-04-04 DIAGNOSIS — O209 Hemorrhage in early pregnancy, unspecified: Secondary | ICD-10-CM | POA: Diagnosis not present

## 2017-04-04 DIAGNOSIS — Z3A10 10 weeks gestation of pregnancy: Secondary | ICD-10-CM | POA: Insufficient documentation

## 2017-04-04 DIAGNOSIS — O418X10 Other specified disorders of amniotic fluid and membranes, first trimester, not applicable or unspecified: Secondary | ICD-10-CM

## 2017-04-04 DIAGNOSIS — R109 Unspecified abdominal pain: Secondary | ICD-10-CM | POA: Diagnosis not present

## 2017-04-04 DIAGNOSIS — Z641 Problems related to multiparity: Secondary | ICD-10-CM

## 2017-04-04 DIAGNOSIS — O468X1 Other antepartum hemorrhage, first trimester: Secondary | ICD-10-CM | POA: Diagnosis present

## 2017-04-04 DIAGNOSIS — O208 Other hemorrhage in early pregnancy: Secondary | ICD-10-CM | POA: Insufficient documentation

## 2017-04-04 DIAGNOSIS — O99332 Smoking (tobacco) complicating pregnancy, second trimester: Secondary | ICD-10-CM | POA: Insufficient documentation

## 2017-04-04 DIAGNOSIS — O26892 Other specified pregnancy related conditions, second trimester: Secondary | ICD-10-CM | POA: Insufficient documentation

## 2017-04-04 DIAGNOSIS — O09891 Supervision of other high risk pregnancies, first trimester: Secondary | ICD-10-CM

## 2017-04-04 DIAGNOSIS — F17210 Nicotine dependence, cigarettes, uncomplicated: Secondary | ICD-10-CM | POA: Insufficient documentation

## 2017-04-04 LAB — URINALYSIS, ROUTINE W REFLEX MICROSCOPIC
BACTERIA UA: NONE SEEN
Bilirubin Urine: NEGATIVE
Glucose, UA: NEGATIVE mg/dL
KETONES UR: NEGATIVE mg/dL
Leukocytes, UA: NEGATIVE
Nitrite: NEGATIVE
PH: 5 (ref 5.0–8.0)
PROTEIN: 30 mg/dL — AB
Specific Gravity, Urine: 1.028 (ref 1.005–1.030)

## 2017-04-04 LAB — CBC
HCT: 39.8 % (ref 36.0–46.0)
HEMOGLOBIN: 13.3 g/dL (ref 12.0–15.0)
MCH: 26.2 pg (ref 26.0–34.0)
MCHC: 33.4 g/dL (ref 30.0–36.0)
MCV: 78.3 fL (ref 78.0–100.0)
Platelets: 226 10*3/uL (ref 150–400)
RBC: 5.08 MIL/uL (ref 3.87–5.11)
RDW: 13.9 % (ref 11.5–15.5)
WBC: 8.2 10*3/uL (ref 4.0–10.5)

## 2017-04-04 LAB — WET PREP, GENITAL
Clue Cells Wet Prep HPF POC: NONE SEEN
Sperm: NONE SEEN
Trich, Wet Prep: NONE SEEN
WBC WET PREP: NONE SEEN
YEAST WET PREP: NONE SEEN

## 2017-04-04 MED ORDER — PRENATAL PLUS 27-1 MG PO TABS: 1.0000 | ORAL_TABLET | Freq: Every day | ORAL | 0 refills | Status: DC

## 2017-04-04 NOTE — MAU Provider Note (Signed)
History     CSN: [id number]  Arrival date and time: 04/04/17 1307     Chief Complaint  Patient presents with  . Vaginal Bleeding  . Abdominal Pain   Stacey Carrillo is a 26 y.o. [id number] at [redacted]w[redacted]d by uncertain menstrual period presenting with onset this morning of first episode of vaginal bleeding associated with intermittent mild cramping. Has not yet started prenatal care. A pos.   Vaginal Bleeding  The patient's primary symptoms include vaginal bleeding. The patient's pertinent negatives include no genital itching, genital lesions, genital odor, pelvic pain or vaginal discharge. This is a new problem. The current episode started today. The problem occurs constantly. The problem has been unchanged. The pain is mild (mild nonpainful cramps). The problem affects both sides. She is pregnant. Associated symptoms include nausea. Pertinent negatives include no back pain, chills, constipation, diarrhea, dysuria, fever, flank pain, frequency, urgency or vomiting. The vaginal discharge was normal. The vaginal bleeding is lighter than menses (Wearing peri-pad all day, not quite as much as menstrual period). She has not been passing clots. She has not been passing tissue. Nothing aggravates the symptoms. She has tried nothing for the symptoms. Sexual activity: No antecedent intercourse. No, her partner does not have an STD. She uses nothing for contraception. Her menstrual history has been regular. Her past medical history is significant for miscarriage.   OB History  Gravida Para Term Preterm AB Living  11 6 6  0 4 6  SAB TAB Ectopic Multiple Live Births  2 2 0 0 6    # Outcome Date GA Lbr Len/2nd Weight Sex Delivery Anes PTL Lv  11 Current           10 Term 03/19/16 [redacted]w[redacted]d 04:30 / 00:03 6 lb 15.8 oz (3.17 kg) F Vag-Spont None  LIV     Birth Comments: none  9 Term 08/16/14 [redacted]w[redacted]d  7 lb 4.2 oz (3.295 kg) F Vag-Spont None  LIV  8 Term 07/17/13 [redacted]w[redacted]d 01:24 / 00:02 7 lb 1.8 oz (3.225 kg) F Vag-Spont None   LIV  7 Term 11/24/11 [redacted]w[redacted]d / 00:01 8 lb 9.9 oz (3.909 kg) M Vag-Spont None  LIV  6 TAB 04/2010 [redacted]w[redacted]d       DEC  5 TAB 10/2009 [redacted]w[redacted]d       DEC  4 Term 02/21/08 [redacted]w[redacted]d  6 lb 7 oz (2.92 kg) F Vag-Spont EPI  LIV  3 Term 04/28/06 [redacted]w[redacted]d  6 lb 2 oz (2.778 kg) M Vag-Spont Gen  LIV  2 SAB           1 SAB              Birth Comments: System Generated. Please review and update pregnancy details.      Past Medical History:  Diagnosis Date  . Abnormal Pap smear   . Anemia   . Group B streptococcal infection   . Hepatitis B   . HPV (human papilloma virus) infection   . Preterm labor     Past Surgical History:  Procedure Laterality Date  . DILATION AND CURETTAGE OF UTERUS      Family History  Problem Relation Age of Onset  . Hearing loss Brother   . Asthma Son     Social History  Substance Use Topics  . Smoking status: Current Some Day Smoker    Packs/day: 0.25    Types: Cigarettes  . Smokeless tobacco: Never Used     Comment: pt stated she is trying to quite  down to 1 ciggarette/day  . Alcohol use Yes     Comment: social    Allergies: No Known Allergies  Facility-Administered Medications Prior to Admission  Medication Dose Route Frequency Provider Last Rate Last Dose  . medroxyPROGESTERone (DEPO-PROVERA) injection 150 mg  150 mg Intramuscular Q90 days Constant, Peggy, MD   150 mg at 07/02/16 1025   Prescriptions Prior to Admission  Medication Sig Dispense Refill Last Dose  . etonogestrel-ethinyl estradiol (NUVARING) 0.12-0.015 MG/24HR vaginal ring Insert vaginally and leave in place for 3 consecutive weeks, then remove for 1 week. 1 each 12     Review of Systems  Constitutional: Negative for chills and fever.  HENT: Negative for congestion.   Gastrointestinal: Positive for nausea. Negative for constipation, diarrhea and vomiting.  Genitourinary: Positive for vaginal bleeding. Negative for dysuria, flank pain, frequency, pelvic pain, urgency and vaginal discharge.   Musculoskeletal: Negative for back pain.  Neurological: Negative for dizziness.  Psychiatric/Behavioral: Negative for agitation. The patient is not nervous/anxious.    Physical Exam   Blood pressure 129/76, pulse 92, temperature 98.4 F (36.9 C), temperature source Oral, resp. rate 18, height 5\' 7"  (1.702 m), weight 212 lb (96.2 kg), last menstrual period 01/24/2017, not currently breastfeeding.  Physical Exam  Nursing note and vitals reviewed. Constitutional: She is oriented to person, place, and time. She appears well-developed and well-nourished. No distress.  Eyes: No scleral icterus.  Neck: Neck supple.  Cardiovascular: Normal rate.   Respiratory: Effort normal.  GI: Soft. There is tenderness.  Genitourinary: No vaginal discharge found.  Genitourinary Comments: Speculum exam: No external or vaginal genital lesions noted. Vagina with small amount menstrual-like blood swabbed from vault. Bimanual:  Cervix long closed  Uterus  10-12 week size No adnexal tenderness or masses   Musculoskeletal: Normal range of motion.  Neurological: She is alert and oriented to person, place, and time.    MAU Course  Procedures Results for orders placed or performed during the hospital encounter of 04/04/17 (from the past 24 hour(s))  Urinalysis, Routine w reflex microscopic     Status: Abnormal   Collection Time: 04/04/17  2:09 PM  Result Value Ref Range   Color, Urine YELLOW YELLOW   APPearance HAZY (A) CLEAR   Specific Gravity, Urine 1.028 1.005 - 1.030   pH 5.0 5.0 - 8.0   Glucose, UA NEGATIVE NEGATIVE mg/dL   Hgb urine dipstick LARGE (A) NEGATIVE   Bilirubin Urine NEGATIVE NEGATIVE   Ketones, ur NEGATIVE NEGATIVE mg/dL   Protein, ur 30 (A) NEGATIVE mg/dL   Nitrite NEGATIVE NEGATIVE   Leukocytes, UA NEGATIVE NEGATIVE   RBC / HPF TOO NUMEROUS TO COUNT 0 - 5 RBC/hpf   WBC, UA 0-5 0 - 5 WBC/hpf   Bacteria, UA NONE SEEN NONE SEEN   Squamous Epithelial / LPF 0-5 (A) NONE SEEN   Mucus  PRESENT   CBC     Status: None   Collection Time: 04/04/17  3:59 PM  Result Value Ref Range   WBC 8.2 4.0 - 10.5 K/uL   RBC 5.08 3.87 - 5.11 MIL/uL   Hemoglobin 13.3 12.0 - 15.0 g/dL   HCT 16.1 09.6 - 04.5 %   MCV 78.3 78.0 - 100.0 fL   MCH 26.2 26.0 - 34.0 pg   MCHC 33.4 30.0 - 36.0 g/dL   RDW 40.9 81.1 - 91.4 %   Platelets 226 150 - 400 K/uL  Wet prep, genital     Status: None   Collection Time:  04/04/17  4:01 PM  Result Value Ref Range   Yeast Wet Prep HPF POC NONE SEEN NONE SEEN   Trich, Wet Prep NONE SEEN NONE SEEN   Clue Cells Wet Prep HPF POC NONE SEEN NONE SEEN   WBC, Wet Prep HPF POC NONE SEEN NONE SEEN   Sperm NONE SEEN    Brief bedside US by me: normal fetal cardiac activity and FM noted [place] Ob Comp Less 14 Wks  Result Date: 04/04/2017 CLINICAL DATA:  26 year old pregnant female presents with bleeding. EDC by LMP: 10/31/2017, projecting to an expected gestational age of [redacted] weeks 0 days. EXAM: OBSTETRIC <14 WK ULTRASOUND TECHNIQUE: Transabdominal ultrasound was performed for evaluation of the gestation as well as the maternal uterus and adnexal regions. COMPARISON:  No prior scans from this gestation. FINDINGS: Intrauterine gestational sac: Single intrauterine gestational sac appears normal in size, shape and position. Yolk sac:  Not Visualized. Fetus: Visualized. Fetal anatomy not assessed at this early gestational age. Fetal Cardiac Activity: Regular rate and rhythm. Fetal Heart Rate: 161 bpm CRL:   36.3  mm   10 w 4 d                  US EDC: 10/27/2017 Subchorionic hemorrhage: Moderate to large perigestational bleed in the left cavity measuring 3.4 x 2.6 x 3.0 cm, involving approximately 50% of the gestational sac circumference. Maternal uterus/adnexae: Left ovary measures 2.2 x 1.9 x 2.1 cm right over measures 2.2 x 1.9 x 1.7 cm and contains a probable corpus luteum cyst. No suspicious ovarian or adnexal masses. No free fluid in the pelvis. No uterine fibroids. IMPRESSION: 1.  Single living intrauterine gestation at 10 weeks 4 days by crown-rump length, concordant with provided menstrual dating. 2. Moderate to large perigestational bleed. Normal fetal cardiac activity. 3. No ovarian or adnexal abnormalities. No free fluid in the pelvis. Electronically Signed   By: Delbert PhenixJason A Poff M.D.   On: 04/04/2017 17:32   Assessment and Plan  [id number] at 1531w4d viable IUP 1. Vaginal bleeding in pregnancy, first trimester   2. Subchorionic hematoma in first trimester, single or unspecified fetus   3. Grand multiparity   4. Short interval between pregnancies affecting pregnancy in first trimester, antepartum    Allergies as of 04/04/2017   No Known Allergies     Medication List    STOP taking these medications   etonogestrel-ethinyl estradiol 0.12-0.015 MG/24HR vaginal ring Commonly known as:  NUVARING   FLINTSTONES GUMMIES PLUS Chew     TAKE these medications   prenatal vitamin w/FE, FA 27-1 MG Tabs tablet Take 1 tablet by mouth daily.            Discharge Care Instructions        Start     Ordered   04/04/17 0000  prenatal vitamin w/FE, FA (PRENATAL 1 + 1) 27-1 MG TABS tablet  Daily    Question:  Supervising Provider  Answer:  Lazaro ArmsURE, LUTHER H   04/04/17 1717     Follow-up Information    [facility]. Call in 1 day(s).   Contact information: [address] [place] [phone]          Dae Highley CNM 04/04/2017, 3:28 PM

## 2017-04-04 NOTE — MAU Note (Signed)
Pt woke up at 0600 with a gush of blood, has been bleeding every since.  C/O mild cramping that also started today.

## 2017-04-04 NOTE — Discharge Instructions (Signed)

## 2017-04-05 LAB — RPR: RPR: NONREACTIVE

## 2017-04-05 LAB — HIV ANTIBODY (ROUTINE TESTING W REFLEX): HIV Screen 4th Generation wRfx: NONREACTIVE

## 2017-04-05 LAB — POCT PREGNANCY, URINE: Preg Test, Ur: POSITIVE — AB

## 2017-04-07 LAB — GC/CHLAMYDIA PROBE AMP (~~LOC~~) NOT AT ARMC
Chlamydia: NEGATIVE
Neisseria Gonorrhea: NEGATIVE

## 2017-04-10 DIAGNOSIS — Z9889 Other specified postprocedural states: Secondary | ICD-10-CM

## 2017-04-10 DIAGNOSIS — O3440 Maternal care for other abnormalities of cervix, unspecified trimester: Secondary | ICD-10-CM | POA: Insufficient documentation

## 2017-04-10 DIAGNOSIS — O0990 Supervision of high risk pregnancy, unspecified, unspecified trimester: Secondary | ICD-10-CM | POA: Insufficient documentation

## 2017-04-13 ENCOUNTER — Other Ambulatory Visit (HOSPITAL_COMMUNITY)
Admission: RE | Admit: 2017-04-13 | Discharge: 2017-04-13 | Disposition: A | Discharge status: Home / Self Care | Payer: Medicaid Other | Source: Ambulatory Visit | Attending: Student | Admitting: Student

## 2017-04-13 ENCOUNTER — Ambulatory Visit (INDEPENDENT_AMBULATORY_CARE_PROVIDER_SITE_OTHER): Payer: Medicaid Other | Admitting: Student

## 2017-04-13 ENCOUNTER — Encounter: Payer: Self-pay | Admitting: Student

## 2017-04-13 DIAGNOSIS — O0991 Supervision of high risk pregnancy, unspecified, first trimester: Secondary | ICD-10-CM

## 2017-04-13 DIAGNOSIS — O0990 Supervision of high risk pregnancy, unspecified, unspecified trimester: Secondary | ICD-10-CM | POA: Insufficient documentation

## 2017-04-13 DIAGNOSIS — Z9889 Other specified postprocedural states: Secondary | ICD-10-CM

## 2017-04-13 DIAGNOSIS — Z3689 Encounter for other specified antenatal screening: Secondary | ICD-10-CM | POA: Diagnosis not present

## 2017-04-13 DIAGNOSIS — O3440 Maternal care for other abnormalities of cervix, unspecified trimester: Secondary | ICD-10-CM

## 2017-04-13 DIAGNOSIS — O3441 Maternal care for other abnormalities of cervix, first trimester: Secondary | ICD-10-CM | POA: Diagnosis not present

## 2017-04-13 MED ORDER — DOXYLAMINE-PYRIDOXINE 10-10 MG PO TBEC: 10.0000 mg | DELAYED_RELEASE_TABLET | Freq: Three times a day (TID) | ORAL | 2 refills | Status: DC

## 2017-04-13 NOTE — Progress Notes (Signed)
  Subjective:    Stacey Carrillo is being seen today for her first obstetrical visit.  This is not a planned pregnancy. She was on depo and wanted to switch to the Nuvaring, but she got pregnant between Depo and the [employer].  She is at 65108w2d gestation. Her obstetrical history is significant for pre-term delivery, short interval between pregnancies, LEEP, ,  Subchorionic hemorrhage, anemia.  Relationship with FOB: significant other, living together. Patient does intend to breast feed. Pregnancy history fully reviewed.  Patient reports bleeding , headache, nausea. She states she has not been eating as much. She drinks lots of water.   Review of Systems:   Review of Systems  Respiratory: Negative.   Cardiovascular: Negative.   Gastrointestinal: Positive for nausea and vomiting.  Genitourinary: Positive for vaginal bleeding.  Musculoskeletal: Negative.   Neurological: Negative.   Vaginal bleeding is now brown and tapering off; she had to change her pad only one time today. She denies cramping or abdominal pain.   Objective:     BP 119/73   Pulse (!) 103   Wt 210 lb 3.2 oz (95.3 kg)   LMP 01/24/2017 (Approximate)   BMI 32.92 kg/m  Physical Exam  Constitutional: She is oriented to person, place, and time. She appears well-developed.  HENT:  Head: Normocephalic.  Neck: Normal range of motion.  GI: Soft.  Musculoskeletal: Normal range of motion.  Neurological: She is alert and oriented to person, place, and time.  Skin: Skin is warm and dry.  Psychiatric: She has a normal mood and affect.    Exam    Assessment:    Pregnancy: [id number] Patient Active Problem List   Diagnosis Date Noted  . H/O LEEP (loop electrosurgical excision procedure) of cervix complicating pregnancy 04/10/2017  . Supervision of high risk pregnancy, antepartum 04/10/2017  . Vaginal bleeding in pregnancy, first trimester 04/04/2017  . Subchorionic hematoma in first trimester 04/04/2017  . Grand multiparity  04/04/2017  . Short interval between pregnancies affecting pregnancy in first trimester, antepartum 04/04/2017  . Severe dysplasia of cervix (CIN III) 09/25/2015  . Chronic hepatitis B (HCC) 05/18/2011    1. SIUP visualized on abdominal ultrasound with present  HR of 160.    Plan:     Initial labs drawn. Prenatal vitamins. Problem list reviewed and updated. Will do 1st trimester screen; ordered.  Role of ultrasound in pregnancy discussed; fetal survey: order at next visit. . Amniocentesis discussed: not indicated. Follow up in 4 weeks. 50% of *30 min visit spent on counseling and coordination of care.  Will order Diclegis for nausea and reviewed diet changes to help with nausea.   [place] [employer].  Discussed bleeding and when to return to MAU (bleeding more than 1 pad in an hour, fatigue, SOB, dizziness that does not resolve with resting and eating) Charlesetta GaribaldiKathryn Lorraine [facility] 04/13/2017

## 2017-04-13 NOTE — Patient Instructions (Signed)
Subchorionic Hematoma A subchorionic hematoma is a gathering of blood between the outer wall of the placenta and the inner wall of the womb (uterus). The placenta is the organ that connects the fetus to the wall of the uterus. The placenta performs the feeding, breathing (oxygen to the fetus), and waste removal (excretory work) of the fetus. Subchorionic hematoma is the most common abnormality found on a result from ultrasonography done during the first trimester or early second trimester of pregnancy. If there has been little or no vaginal bleeding, early small hematomas usually shrink on their own and do not affect your baby or pregnancy. The blood is gradually absorbed over 1-2 weeks. When bleeding starts later in pregnancy or the hematoma is larger or occurs in an older pregnant woman, the outcome may not be as good. Larger hematomas may get bigger, which increases the chances for miscarriage. Subchorionic hematoma also increases the risk of premature detachment of the placenta from the uterus, preterm (premature) labor, and stillbirth. Follow these instructions at home:  Stay on bed rest if your health care provider recommends this. Although bed rest will not prevent more bleeding or prevent a miscarriage, your health care provider may recommend bed rest until you are advised otherwise.  Avoid heavy lifting (more than 10 lb [4.5 kg]), exercise, sexual intercourse, or douching as directed by your health care provider.  Keep track of the number of pads you use each day and how soaked (saturated) they are. Write down this information.  Do not use tampons.  Keep all follow-up appointments as directed by your health care provider. Your health care provider may ask you to have follow-up blood tests or ultrasound tests or both. Get help right away if:  You have severe cramps in your stomach, back, abdomen, or pelvis.  You have a fever.  You pass large clots or tissue. Save any tissue for your  health care provider to look at.  Your bleeding increases or you become lightheaded, feel weak, or have fainting episodes. This information is not intended to replace advice given to you by your health care provider. Make sure you discuss any questions you have with your health care provider. Document Released: 11/03/2006 Document Revised: 12/25/2015 Document Reviewed: 02/15/2013 Elsevier Interactive Patient Education  2017 Elsevier Inc. Eating Plan for Hyperemesis Gravidarum Hyperemesis gravidarum is a severe form of morning sickness. Because this condition causes severe nausea and vomiting, it can lead to dehydration, malnutrition, and weight loss. One way to lessen the symptoms of nausea and vomiting is to follow the eating plan for hyperemesis gravidarum. It is often used along with prescribed medicines to control your symptoms. What can I do to relieve my symptoms? Listen to your body. Everyone is different and has different preferences. Find what works best for you. Take any of the following actions that are helpful to you:  Eat and drink slowly.  Eat 5-6 small meals daily instead of 3 large meals.  Eat crackers before you get out of bed in the morning.  Try having a snack in the middle of the night.  Starchy foods are usually tolerated well. Examples include cereal, toast, bread, potatoes, pasta, rice, and pretzels.  Ginger may help with nausea. Add  tsp ground ginger to hot tea or choose ginger tea.  Try drinking 100% fruit juice or an electrolyte drink. An electrolyte drink contains sodium, potassium, and chloride.  Continue to take your prenatal vitamins as told by your health care provider. If you are having  trouble taking your prenatal vitamins, talk with your health care provider about different options.  Include at least 1 serving of protein with your meals and snacks. Protein options include meats or poultry, beans, nuts, eggs, and yogurt. Try eating a protein-rich snack  before bed. Examples of these snacks include cheese and crackers or half of a peanut butter or [place] sandwich.  Consider eliminating foods that trigger your symptoms. These may include spicy foods, coffee, high-fat foods, very sweet foods, and acidic foods.  Try meals that have more protein combined with bland, salty, lower-fat, and dry foods, such as nuts, seeds, pretzels, crackers, and cereal.  Talk with your healthcare provider about starting a supplement of vitamin B6.  Have fluids that are cold, clear, and carbonated or sour. Examples include lemonade, ginger ale, lemon-lime soda, ice water, and sparkling water.  Try lemon or mint tea.  Try brushing your teeth or using a mouth rinse after meals.  What should I avoid to reduce my symptoms? Avoiding some of the following things may help reduce your symptoms.  Foods with strong smells. Try eating meals in well-ventilated areas that are free of odors.  Drinking water or other beverages with meals. Try not to drink anything during the 30 minutes before and after your meals.  Drinking more than 1 cup of fluid at a time. Sometimes using a straw helps.  Fried or high-fat foods, such as butter and cream sauces.  Spicy foods.  Skipping meals as best as you can. Nausea can be more intense on an empty stomach. If you cannot tolerate food at that time, do not force it. Try sucking on ice chips or other frozen items, and make up for missed calories later.  Lying down within 2 hours after eating.  Environmental triggers. These may include smoky rooms, closed spaces, rooms with strong smells, warm or humid places, overly loud and noisy rooms, and rooms with motion or flickering lights.  Quick and sudden changes in your movement.  This information is not intended to replace advice given to you by your health care provider. Make sure you discuss any questions you have with your health care provider. Document Released: 05/16/2007 Document  Revised: 03/17/2016 Document Reviewed: 02/17/2016 Elsevier Interactive Patient Education  [employer]2018 Elsevier Inc.

## 2017-04-13 NOTE — Addendum Note (Signed)
Addended by: Arne ClevelandHUTCHINSON, Kynzlie Hilleary J on: 04/13/2017 03:41 PM   Modules accepted: Orders

## 2017-04-13 NOTE — Progress Notes (Signed)
DATING AND VIABILITY SONOGRAM   [name] is a 26 y.o. year old [phone] with LMP Patient's last menstrual period was 01/24/2017 (approximate). which would correlate to  6333w2d weeks gestation.  She has regular menstrual cycles.   She is here today for a confirmatory initial sonogram.    GESTATION: SINGLETON     FETAL ACTIVITY:          Heart rate         160bpm          The fetus is active.  GESTATIONAL AGE AND  BIOMETRICS:  Gestational criteria: Estimated Date of Delivery: 10/31/17 by LMP now at 5733w2d  Previous Scans:0 CROWN RUMP LENGTH           7648w4d 1048w4d                                                                               AVERAGE EGA(BY THIS SCAN):  7348w4d weeks  WORKING EDD( LMP ):  10/31/17      TECHNICIAN COMMENTS:  SLIUP measuring 6348w4d by CRL with FHR 160bpm    A copy of this report including all images has been saved and backed up to a second source for retrieval if needed. All measures and details of the anatomical scan, placentation, fluid volume and pelvic anatomy are contained in that report.  Mandy Hutchinson 04/13/2017 4:06 PM

## 2017-04-14 LAB — URINE CYTOLOGY ANCILLARY ONLY
Chlamydia: NEGATIVE
Neisseria Gonorrhea: NEGATIVE

## 2017-04-14 LAB — PROTEIN / CREATININE RATIO, URINE
CREATININE, UR: 286 mg/dL
PROTEIN UR: 17.1 mg/dL
Protein/Creat Ratio: 60 mg/g creat (ref 0–200)

## 2017-04-15 LAB — URINE CULTURE, OB REFLEX

## 2017-04-15 LAB — CULTURE, OB URINE

## 2017-04-18 ENCOUNTER — Encounter (INDEPENDENT_AMBULATORY_CARE_PROVIDER_SITE_OTHER): Payer: Medicaid Other | Admitting: *Deleted

## 2017-04-18 DIAGNOSIS — Z3481 Encounter for supervision of other normal pregnancy, first trimester: Secondary | ICD-10-CM

## 2017-04-21 LAB — OBSTETRIC PANEL, INCLUDING HIV
Antibody Screen: NEGATIVE
BASOS ABS: 0 10*3/uL (ref 0.0–0.2)
Basos: 0 %
EOS (ABSOLUTE): 0.1 10*3/uL (ref 0.0–0.4)
EOS: 2 %
HEMATOCRIT: 38.2 % (ref 34.0–46.6)
HIV Screen 4th Generation wRfx: NONREACTIVE
Hemoglobin: 12.8 g/dL (ref 11.1–15.9)
IMMATURE GRANS (ABS): 0 10*3/uL (ref 0.0–0.1)
IMMATURE GRANULOCYTES: 0 %
LYMPHS ABS: 1.7 10*3/uL (ref 0.7–3.1)
LYMPHS: 24 %
MCH: 26.4 pg — ABNORMAL LOW (ref 26.6–33.0)
MCHC: 33.5 g/dL (ref 31.5–35.7)
MCV: 79 fL (ref 79–97)
MONOS ABS: 0.3 10*3/uL (ref 0.1–0.9)
Monocytes: 4 %
Neutrophils Absolute: 4.9 10*3/uL (ref 1.4–7.0)
Neutrophils: 70 %
PLATELETS: 216 10*3/uL (ref 150–379)
RBC: 4.85 x10E6/uL (ref 3.77–5.28)
RDW: 14.2 % (ref 12.3–15.4)
RH TYPE: POSITIVE
RPR Ser Ql: NONREACTIVE
Rubella Antibodies, IGG: 3.93 index (ref >0.99)
WBC: 7.1 10*3/uL (ref 3.4–10.8)

## 2017-04-21 LAB — SMN1 COPY NUMBER ANALYSIS (SMA CARRIER SCREENING)

## 2017-04-21 LAB — CMP AND LIVER
ALK PHOS: 50 IU/L (ref 39–117)
ALT: 22 IU/L (ref 0–32)
AST: 20 IU/L (ref 0–40)
Albumin: 4.1 g/dL (ref 3.5–5.5)
BILIRUBIN, DIRECT: 0.06 mg/dL (ref 0.00–0.40)
BUN: 5 mg/dL — ABNORMAL LOW (ref 6–20)
CALCIUM: 9.8 mg/dL (ref 8.7–10.2)
CHLORIDE: 104 mmol/L (ref 96–106)
CO2: 21 mmol/L (ref 20–29)
Creatinine, Ser: 0.53 mg/dL — ABNORMAL LOW (ref 0.57–1.00)
GFR calc Af Amer: 150 mL/min/{1.73_m2} (ref >59)
GFR calc non Af Amer: 131 mL/min/{1.73_m2} (ref >59)
Glucose: 102 mg/dL — ABNORMAL HIGH (ref 65–99)
Potassium: 4.1 mmol/L (ref 3.5–5.2)
SODIUM: 141 mmol/L (ref 134–144)
Total Protein: 6.5 g/dL (ref 6.0–8.5)

## 2017-04-21 LAB — HEMOGLOBIN A1C
Est. average glucose Bld gHb Est-mCnc: 114 mg/dL
Hgb A1c MFr Bld: 5.6 % (ref 4.8–5.6)

## 2017-04-21 LAB — HEPATITIS B SURFACE AG, CONFIRM: HBSAG CONFIRMATION: POSITIVE — AB

## 2017-04-28 ENCOUNTER — Encounter (HOSPITAL_COMMUNITY): Payer: Self-pay

## 2017-04-28 ENCOUNTER — Other Ambulatory Visit: Payer: Self-pay | Admitting: Student

## 2017-04-28 ENCOUNTER — Ambulatory Visit (HOSPITAL_COMMUNITY)
Admission: RE | Admit: 2017-04-28 | Discharge: 2017-04-28 | Disposition: A | Discharge status: Home / Self Care | Payer: Medicaid Other | Source: Ambulatory Visit | Attending: Student | Admitting: Student

## 2017-04-28 DIAGNOSIS — Z3682 Encounter for antenatal screening for nuchal translucency: Secondary | ICD-10-CM | POA: Insufficient documentation

## 2017-04-28 DIAGNOSIS — O0941 Supervision of pregnancy with grand multiparity, first trimester: Secondary | ICD-10-CM

## 2017-04-28 DIAGNOSIS — Z3A14 14 weeks gestation of pregnancy: Secondary | ICD-10-CM | POA: Insufficient documentation

## 2017-04-28 DIAGNOSIS — O0942 Supervision of pregnancy with grand multiparity, second trimester: Secondary | ICD-10-CM | POA: Insufficient documentation

## 2017-04-28 DIAGNOSIS — O0990 Supervision of high risk pregnancy, unspecified, unspecified trimester: Secondary | ICD-10-CM

## 2017-05-05 ENCOUNTER — Other Ambulatory Visit: Payer: Self-pay

## 2017-05-11 ENCOUNTER — Ambulatory Visit (INDEPENDENT_AMBULATORY_CARE_PROVIDER_SITE_OTHER): Payer: Medicaid Other | Admitting: Student

## 2017-05-11 VITALS — BP 104/70 | HR 102 | Wt 212.0 lb

## 2017-05-11 DIAGNOSIS — O0990 Supervision of high risk pregnancy, unspecified, unspecified trimester: Secondary | ICD-10-CM

## 2017-05-11 DIAGNOSIS — O468X1 Other antepartum hemorrhage, first trimester: Secondary | ICD-10-CM

## 2017-05-11 DIAGNOSIS — O418X10 Other specified disorders of amniotic fluid and membranes, first trimester, not applicable or unspecified: Secondary | ICD-10-CM

## 2017-05-12 NOTE — Addendum Note (Signed)
Addended by: Arne Cleveland on: 05/12/2017 10:08 AM   Modules accepted: Orders

## 2017-05-12 NOTE — Addendum Note (Signed)
Addended by: Cheree Ditto, Lue Sykora A on: 05/12/2017 10:43 AM   Modules accepted: Kipp Brood

## 2017-05-12 NOTE — Progress Notes (Signed)
   PRENATAL VISIT NOTE  Subjective:  Stacey Carrillo is a 26 y.o. [id number] at [redacted]w[redacted]d being seen today for ongoing prenatal care.  She is currently monitored for the following issues for this high-risk pregnancy and has Chronic hepatitis B (HCC); Severe dysplasia of cervix (CIN III); Vaginal bleeding in pregnancy, first trimester; Subchorionic hematoma in first trimester; Grand multiparity; Short interval between pregnancies affecting pregnancy in first trimester, antepartum; H/O LEEP (loop electrosurgical excision procedure) of cervix complicating pregnancy; and Supervision of high risk pregnancy, antepartum on her problem list.  Patient reports bleeding.  Patient says she has periods of bleeding and then goes for a few days without any bleeding. Bleeding is not enough to stain clothes or require changing her pad.  Movement: Absent. Denies leaking of fluid.   The following portions of the patient's history were reviewed and updated as appropriate: allergies, current medications, past family history, past medical history, past social history, past surgical history and problem list. Problem list updated.  Objective:   Vitals:   05/11/17 1641  BP: 104/70  Pulse: (!) 102  Weight: 212 lb (96.2 kg)    Fetal Status: Fetal Heart Rate (bpm): 145   Movement: Absent     General:  Alert, oriented and cooperative. Patient is in no acute distress.  Skin: Skin is warm and dry. No rash noted.   Cardiovascular: Normal heart rate noted  Respiratory: Normal respiratory effort, no problems with respiration noted  Abdomen: Soft, gravid, appropriate for gestational age.        Pelvic: Cervical exam deferred        Extremities: Normal range of motion.  Edema: None  Mental Status:  Normal mood and affect. Normal behavior. Normal judgment and thought content.   Assessment and Plan:  Pregnancy: [id number] at [redacted]w[redacted]d  1. Supervision of high risk pregnancy, antepartum  - HBV RT PCR, QUANT (GRAPH)  2. Subchorionic  hematoma in first trimester, single or unspecified fetus Patient denies heavy bleeding; says that it come and goes but is not worse than before. Reviewed bleeding precautions and when to return to the MAU.   3. Reviewed [place] results for patient; will do AFP at next visit and schedule Anatomy scan at next visit.   Preterm labor symptoms and general obstetric precautions including but not limited to vaginal bleeding, contractions, leaking of fluid and fetal movement were reviewed in detail with the patient. Please refer to After Visit Summary for other counseling recommendations.  Return in about 4 weeks (around 06/08/2017).   Marylene Land, CNM

## 2017-05-13 LAB — HBV RT PCR, QUANT (GRAPH)
HBV DNA SERPL PCR-ACNC: 30 [IU]/mL
HBV DNA SERPL PCR-LOG IU: 1.477 {Log_IU}/mL

## 2017-05-31 ENCOUNTER — Encounter (HOSPITAL_COMMUNITY): Payer: Self-pay | Admitting: Student

## 2017-06-08 ENCOUNTER — Ambulatory Visit (INDEPENDENT_AMBULATORY_CARE_PROVIDER_SITE_OTHER): Payer: Medicaid Other | Admitting: Nurse Practitioner

## 2017-06-08 ENCOUNTER — Encounter: Payer: Self-pay | Admitting: Nurse Practitioner

## 2017-06-08 VITALS — BP 107/73 | HR 108 | Wt 203.8 lb

## 2017-06-08 DIAGNOSIS — O0992 Supervision of high risk pregnancy, unspecified, second trimester: Secondary | ICD-10-CM

## 2017-06-08 NOTE — Progress Notes (Signed)
FHR in office today fluctuates from 60's to 150's. Pt is scheduled for anatomy scan tomorrow. Per Dr. Alvester MorinNewton, sent pt to MAU for prolonged monitoring.

## 2017-06-08 NOTE — Progress Notes (Signed)
    Subjective:  Stacey Carrillo is a 26 y.o. [id number] at 6250w2d being seen today for ongoing prenatal care.  She is currently monitored for the following issues for this  high-risk pregnancy and has Chronic hepatitis B (HCC); Severe dysplasia of cervix (CIN III); Vaginal bleeding in pregnancy, first trimester; Subchorionic hematoma in first trimester; Grand multiparity; Short interval between pregnancies affecting pregnancy in first trimester, antepartum; H/O LEEP (loop electrosurgical excision procedure) of cervix complicating pregnancy; and Supervision of high risk pregnancy, antepartum on their problem list.  Patient reports no complaints.   .  .  Movement: Present. Denies leaking of fluid.   The following portions of the patient's history were reviewed and updated as appropriate: allergies, current medications, past family history, past medical history, past social history, past surgical history and problem list. Problem list updated.  Objective:   Vitals:   06/08/17 1619  BP: 107/73  Pulse: (!) 108  Weight: 203 lb 12.8 oz (92.4 kg)    Fetal Status:     Movement: Present     General:  Alert, oriented and cooperative. Patient is in no acute distress.  Skin: Skin is warm and dry. No rash noted.   Cardiovascular: Normal heart rate noted  Respiratory: Normal respiratory effort, no problems with respiration noted  Abdomen: Soft, gravid, appropriate for gestational age.Fundal Height at the umbilicus.  Pain/Pressure: Absent     Pelvic:  Cervical exam deferred        Extremities: Normal range of motion.  Edema: None  Mental Status: Normal mood and affect. Normal behavior. Normal judgment and thought content.     Assessment and Plan:  Pregnancy: [id number] at 1750w2d with severe fetal heart rate arrhythmia sent to MAU for further evaluation - By doppler FHT was 66 and on ultrasound was 62 and then 123.  Heart rate bouncing back and forth.  Can hear the baby moving and client reports feeling active  movement.   Attending - Dr. Macon LargeAnyanwu consulted and was advised to go to MAU - has anatomy ultrasound scheduled tomorrow.  Brief visit here today.  There are no diagnoses linked to this encounter. Preterm labor symptoms and general obstetric precautions including but not limited to vaginal bleeding, contractions, leaking of fluid and fetal movement were reviewed in detail with the patient. Please refer to After Visit Summary for other counseling recommendations.   Nolene BernheimERRI Skya Mccullum, RN, MSN, NP-BC Nurse Practitioner, [facility], [facility] 06/08/2017 5:14 PM

## 2017-06-09 ENCOUNTER — Ambulatory Visit (HOSPITAL_COMMUNITY)
Admission: RE | Admit: 2017-06-09 | Discharge: 2017-06-09 | Disposition: A | Discharge status: Home / Self Care | Payer: Medicaid Other | Source: Ambulatory Visit | Attending: Student | Admitting: Student

## 2017-06-09 ENCOUNTER — Other Ambulatory Visit: Payer: Self-pay | Admitting: Student

## 2017-06-09 DIAGNOSIS — Z3A19 19 weeks gestation of pregnancy: Secondary | ICD-10-CM

## 2017-06-09 DIAGNOSIS — O468X1 Other antepartum hemorrhage, first trimester: Secondary | ICD-10-CM

## 2017-06-09 DIAGNOSIS — O418X10 Other specified disorders of amniotic fluid and membranes, first trimester, not applicable or unspecified: Secondary | ICD-10-CM

## 2017-06-09 DIAGNOSIS — Z3A20 20 weeks gestation of pregnancy: Secondary | ICD-10-CM | POA: Diagnosis not present

## 2017-06-09 DIAGNOSIS — O0992 Supervision of high risk pregnancy, unspecified, second trimester: Secondary | ICD-10-CM | POA: Diagnosis not present

## 2017-06-09 DIAGNOSIS — Z363 Encounter for antenatal screening for malformations: Secondary | ICD-10-CM | POA: Insufficient documentation

## 2017-06-09 DIAGNOSIS — O99212 Obesity complicating pregnancy, second trimester: Secondary | ICD-10-CM | POA: Insufficient documentation

## 2017-06-09 DIAGNOSIS — O0990 Supervision of high risk pregnancy, unspecified, unspecified trimester: Secondary | ICD-10-CM

## 2017-06-09 DIAGNOSIS — O368320 Maternal care for abnormalities of the fetal heart rate or rhythm, second trimester, not applicable or unspecified: Secondary | ICD-10-CM | POA: Insufficient documentation

## 2017-06-09 DIAGNOSIS — Z362 Encounter for other antenatal screening follow-up: Secondary | ICD-10-CM | POA: Insufficient documentation

## 2017-06-09 DIAGNOSIS — O0942 Supervision of pregnancy with grand multiparity, second trimester: Secondary | ICD-10-CM | POA: Diagnosis not present

## 2017-06-10 ENCOUNTER — Telehealth: Payer: Self-pay

## 2017-06-10 ENCOUNTER — Ambulatory Visit (HOSPITAL_COMMUNITY): Payer: Medicaid Other

## 2017-06-10 ENCOUNTER — Other Ambulatory Visit: Payer: Medicaid Other

## 2017-06-10 ENCOUNTER — Other Ambulatory Visit: Payer: Self-pay | Admitting: *Deleted

## 2017-06-10 DIAGNOSIS — O0990 Supervision of high risk pregnancy, unspecified, unspecified trimester: Secondary | ICD-10-CM

## 2017-06-10 DIAGNOSIS — O0992 Supervision of high risk pregnancy, unspecified, second trimester: Secondary | ICD-10-CM

## 2017-06-10 NOTE — Progress Notes (Signed)
Called patient to let her know she will need to have a AFP drawn today and Follow up u/s next week. Referral has been placed and patient will be schedule.

## 2017-06-10 NOTE — Telephone Encounter (Signed)
Patient will need a follow up u/s to monitor heart rate per MFM.

## 2017-06-10 NOTE — Addendum Note (Signed)
Addended by: Cheree DittoGRAHAM, Kazim Corrales A on: 06/10/2017 10:56 AM   Modules accepted: Orders

## 2017-06-12 ENCOUNTER — Encounter: Payer: Self-pay | Admitting: Student

## 2017-06-12 DIAGNOSIS — O368390 Maternal care for abnormalities of the fetal heart rate or rhythm, unspecified trimester, not applicable or unspecified: Secondary | ICD-10-CM | POA: Insufficient documentation

## 2017-06-13 ENCOUNTER — Other Ambulatory Visit (HOSPITAL_COMMUNITY): Payer: Self-pay | Admitting: *Deleted

## 2017-06-13 DIAGNOSIS — O368390 Maternal care for abnormalities of the fetal heart rate or rhythm, unspecified trimester, not applicable or unspecified: Secondary | ICD-10-CM

## 2017-06-13 LAB — AFP, SERUM, OPEN SPINA BIFIDA

## 2017-06-15 LAB — AFP, SERUM, OPEN SPINA BIFIDA
AFP MoM: 0.77
AFP Value: 31.1 ng/mL
GEST. AGE ON COLLECTION DATE: 19 wk
Maternal Age At EDD: 28.2 yr
OSBR RISK 1 IN: 10000
TEST RESULTS AFP: NEGATIVE
Weight: 205 [lb_av]

## 2017-06-17 ENCOUNTER — Ambulatory Visit (HOSPITAL_COMMUNITY)
Admission: RE | Admit: 2017-06-17 | Discharge: 2017-06-17 | Disposition: A | Discharge status: Home / Self Care | Payer: Medicaid Other | Source: Ambulatory Visit | Attending: Obstetrics and Gynecology | Admitting: Obstetrics and Gynecology

## 2017-06-17 ENCOUNTER — Encounter (HOSPITAL_COMMUNITY): Payer: Self-pay

## 2017-06-17 ENCOUNTER — Ambulatory Visit (HOSPITAL_COMMUNITY): Payer: Medicaid Other

## 2017-06-17 DIAGNOSIS — O368390 Maternal care for abnormalities of the fetal heart rate or rhythm, unspecified trimester, not applicable or unspecified: Secondary | ICD-10-CM

## 2017-06-17 DIAGNOSIS — O3440 Maternal care for other abnormalities of cervix, unspecified trimester: Secondary | ICD-10-CM

## 2017-06-17 DIAGNOSIS — Z539 Procedure and treatment not carried out, unspecified reason: Secondary | ICD-10-CM | POA: Diagnosis not present

## 2017-06-17 DIAGNOSIS — Z9889 Other specified postprocedural states: Secondary | ICD-10-CM

## 2017-06-17 DIAGNOSIS — O0990 Supervision of high risk pregnancy, unspecified, unspecified trimester: Secondary | ICD-10-CM

## 2017-06-20 ENCOUNTER — Ambulatory Visit (HOSPITAL_COMMUNITY): Payer: Medicaid Other

## 2017-06-20 ENCOUNTER — Other Ambulatory Visit: Payer: Self-pay | Admitting: *Deleted

## 2017-06-20 DIAGNOSIS — IMO0002 Reserved for concepts with insufficient information to code with codable children: Secondary | ICD-10-CM

## 2017-06-22 ENCOUNTER — Encounter (HOSPITAL_COMMUNITY): Payer: Self-pay

## 2017-06-22 ENCOUNTER — Ambulatory Visit (HOSPITAL_COMMUNITY)
Admission: RE | Admit: 2017-06-22 | Discharge: 2017-06-22 | Disposition: A | Discharge status: Home / Self Care | Payer: Medicaid Other | Source: Ambulatory Visit | Attending: Obstetrics & Gynecology | Admitting: Obstetrics & Gynecology

## 2017-06-22 ENCOUNTER — Other Ambulatory Visit (HOSPITAL_COMMUNITY): Payer: Self-pay | Admitting: Maternal and Fetal Medicine

## 2017-06-22 DIAGNOSIS — O368390 Maternal care for abnormalities of the fetal heart rate or rhythm, unspecified trimester, not applicable or unspecified: Secondary | ICD-10-CM

## 2017-06-22 DIAGNOSIS — E669 Obesity, unspecified: Secondary | ICD-10-CM | POA: Diagnosis not present

## 2017-06-22 DIAGNOSIS — O368320 Maternal care for abnormalities of the fetal heart rate or rhythm, second trimester, not applicable or unspecified: Secondary | ICD-10-CM | POA: Diagnosis not present

## 2017-06-22 DIAGNOSIS — IMO0002 Reserved for concepts with insufficient information to code with codable children: Secondary | ICD-10-CM

## 2017-06-22 DIAGNOSIS — O0940 Supervision of pregnancy with grand multiparity, unspecified trimester: Secondary | ICD-10-CM

## 2017-06-22 DIAGNOSIS — Z3A21 21 weeks gestation of pregnancy: Secondary | ICD-10-CM | POA: Diagnosis not present

## 2017-06-22 DIAGNOSIS — O99212 Obesity complicating pregnancy, second trimester: Secondary | ICD-10-CM

## 2017-06-22 DIAGNOSIS — O0942 Supervision of pregnancy with grand multiparity, second trimester: Secondary | ICD-10-CM | POA: Diagnosis not present

## 2017-06-22 DIAGNOSIS — O3440 Maternal care for other abnormalities of cervix, unspecified trimester: Secondary | ICD-10-CM

## 2017-06-22 DIAGNOSIS — O0990 Supervision of high risk pregnancy, unspecified, unspecified trimester: Secondary | ICD-10-CM

## 2017-06-22 DIAGNOSIS — Z9889 Other specified postprocedural states: Secondary | ICD-10-CM

## 2017-06-28 ENCOUNTER — Ambulatory Visit (HOSPITAL_COMMUNITY): Payer: Medicaid Other

## 2017-07-06 ENCOUNTER — Ambulatory Visit (INDEPENDENT_AMBULATORY_CARE_PROVIDER_SITE_OTHER): Payer: Medicaid Other | Admitting: Student

## 2017-07-06 VITALS — BP 115/74 | HR 80 | Wt 204.4 lb

## 2017-07-06 DIAGNOSIS — Z23 Encounter for immunization: Secondary | ICD-10-CM | POA: Diagnosis not present

## 2017-07-06 DIAGNOSIS — O209 Hemorrhage in early pregnancy, unspecified: Secondary | ICD-10-CM

## 2017-07-06 DIAGNOSIS — O368390 Maternal care for abnormalities of the fetal heart rate or rhythm, unspecified trimester, not applicable or unspecified: Secondary | ICD-10-CM

## 2017-07-06 DIAGNOSIS — O0992 Supervision of high risk pregnancy, unspecified, second trimester: Secondary | ICD-10-CM

## 2017-07-06 DIAGNOSIS — B181 Chronic viral hepatitis B without delta-agent: Secondary | ICD-10-CM

## 2017-07-06 DIAGNOSIS — O0990 Supervision of high risk pregnancy, unspecified, unspecified trimester: Secondary | ICD-10-CM

## 2017-07-06 MED ORDER — PRENATAL PLUS 27-1 MG PO TABS: 1.0000 | ORAL_TABLET | Freq: Every day | ORAL | 0 refills | Status: DC

## 2017-07-06 NOTE — Progress Notes (Signed)
Pt needs RF on PNVs Pt states she has no concerns today.

## 2017-07-07 ENCOUNTER — Encounter (HOSPITAL_COMMUNITY): Payer: Self-pay

## 2017-07-07 ENCOUNTER — Inpatient Hospital Stay (HOSPITAL_COMMUNITY)
Admission: RE | Admit: 2017-07-07 | Discharge: 2017-07-07 | Disposition: A | Discharge status: Home / Self Care | Payer: Medicaid Other | Source: Ambulatory Visit | Attending: Obstetrics & Gynecology | Admitting: Obstetrics & Gynecology

## 2017-07-07 ENCOUNTER — Other Ambulatory Visit: Payer: Self-pay | Admitting: Obstetrics and Gynecology

## 2017-07-07 DIAGNOSIS — O99212 Obesity complicating pregnancy, second trimester: Secondary | ICD-10-CM | POA: Insufficient documentation

## 2017-07-07 DIAGNOSIS — O0940 Supervision of pregnancy with grand multiparity, unspecified trimester: Secondary | ICD-10-CM

## 2017-07-07 DIAGNOSIS — Z3A24 24 weeks gestation of pregnancy: Secondary | ICD-10-CM | POA: Insufficient documentation

## 2017-07-07 DIAGNOSIS — O0990 Supervision of high risk pregnancy, unspecified, unspecified trimester: Secondary | ICD-10-CM

## 2017-07-07 DIAGNOSIS — O368390 Maternal care for abnormalities of the fetal heart rate or rhythm, unspecified trimester, not applicable or unspecified: Secondary | ICD-10-CM

## 2017-07-07 NOTE — Progress Notes (Signed)
   PRENATAL VISIT NOTE  Subjective:  Stacey Carrillo is a 26 y.o. [id number] at 5856w0d being seen today for ongoing prenatal care.  She is currently monitored for the following issues for this high-risk pregnancy and has Chronic hepatitis B (HCC); Severe dysplasia of cervix (CIN III); Vaginal bleeding in pregnancy, first trimester; Subchorionic hematoma in first trimester; Grand multiparity; Short interval between pregnancies affecting pregnancy in first trimester, antepartum; H/O LEEP (loop electrosurgical excision procedure) of cervix complicating pregnancy; Supervision of high risk pregnancy, antepartum; and Fetal arrhythmia affecting pregnancy, antepartum on their problem list.  Patient reports no complaints.  Contractions: Not present. Vag. Bleeding: None.  Movement: Present. Denies leaking of fluid.   The following portions of the patient's history were reviewed and updated as appropriate: allergies, current medications, past family history, past medical history, past social history, past surgical history and problem list. Problem list updated.  Objective:   Vitals:   07/06/17 1550  BP: 115/74  Pulse: 80  Weight: 204 lb 6.4 oz (92.7 kg)    Fetal Status: Fetal Heart Rate (bpm): 148 Fundal Height: 22 cm Movement: Present     General:  Alert, oriented and cooperative. Patient is in no acute distress.  Skin: Skin is warm and dry. No rash noted.   Cardiovascular: Normal heart rate noted  Respiratory: Normal respiratory effort, no problems with respiration noted  Abdomen: Soft, gravid, appropriate for gestational age.  Pain/Pressure: Absent     Pelvic: Cervical exam deferred        Extremities: Normal range of motion.  Edema: None  Mental Status:  Normal mood and affect. Normal behavior. Normal judgment and thought content.   Assessment and Plan:  Pregnancy: [id number] at 156w0d  1. Fetal arrhythmia affecting pregnancy, antepartum Now resolved  2. Chronic hepatitis B (HCC) Needs Hep B labs  at 28 week visit (HBV quant, pt, inr, ptt, fibrinogen)  3. Supervision of high risk pregnancy, antepartum -Follow up US scheduled on 12-7  4. Immunization due  - Flu Vaccine QUAD 36+ mos IM  5. Vaginal bleeding in pregnancy, first trimester Now resolved  Preterm labor symptoms and general obstetric precautions including but not limited to vaginal bleeding, contractions, leaking of fluid and fetal movement were reviewed in detail with the patient. Please refer to After Visit Summary for other counseling recommendations.  Return 4 weeks for HROB. and 2 hour GTT plus 28 week labs.   Marylene LandKathryn Lorraine Jaimon Bugaj, CNM

## 2017-07-18 ENCOUNTER — Ambulatory Visit (INDEPENDENT_AMBULATORY_CARE_PROVIDER_SITE_OTHER): Payer: Medicaid Other | Admitting: Obstetrics and Gynecology

## 2017-07-18 VITALS — BP 108/78 | HR 71 | Wt 199.0 lb

## 2017-07-18 DIAGNOSIS — O26892 Other specified pregnancy related conditions, second trimester: Secondary | ICD-10-CM

## 2017-07-18 DIAGNOSIS — M545 Low back pain, unspecified: Secondary | ICD-10-CM

## 2017-07-19 NOTE — Progress Notes (Signed)
Prenatal Visit Note-Problem Visit Date: 07/19/2017 Clinic: [facility]  Subjective:  Stacey Carrillo is a 26 y.o. [id number] at 2584w5d being seen today for ongoing prenatal care.  She is currently monitored for the following issues for this high-risk pregnancy and has Chronic hepatitis B (HCC); Severe dysplasia of cervix (CIN III); Vaginal bleeding in pregnancy, first trimester; Subchorionic hematoma in first trimester; Grand multiparity; Short interval between pregnancies affecting pregnancy in first trimester, antepartum; H/O LEEP (loop electrosurgical excision procedure) of cervix complicating pregnancy; Supervision of high risk pregnancy, antepartum; and Fetal arrhythmia affecting pregnancy, antepartum on their problem list.  Patient reports low back and pelvic discomfort.   Contractions: Not present. Vag. Bleeding: None.  Movement: Present. Denies leaking of fluid.   The following portions of the patient's history were reviewed and updated as appropriate: allergies, current medications, past family history, past medical history, past social history, past surgical history and problem list. Problem list updated.  Objective:   Vitals:   07/18/17 1522  BP: 108/78  Pulse: 71  Weight: 199 lb (90.3 kg)    Fetal Status: Fetal Heart Rate (bpm): 140 Fundal Height: 25 cm Movement: Present     General:  Alert, oriented and cooperative. Patient is in no acute distress.  Skin: Skin is warm and dry. No rash noted.   Cardiovascular: Normal heart rate noted  Respiratory: Normal respiratory effort, no problems with respiration noted  Abdomen: Soft, gravid, appropriate for gestational age. Pain/Pressure: Present     Pelvic:  Cervical exam performed      EGBUS, vaginal vault and cervix negative  Extremities: Normal range of motion.  Edema: None  Mental Status: Normal mood and affect. Normal behavior. Normal judgment and thought content.   No CVAT  Urinalysis:      Assessment and  Plan:  Pregnancy: [id number] at 8384w5d  1. Low back pain during pregnancy in second trimester F/u u/a. Pt works at [employer] and likely musculoskeletal discmfort. Work note given.   Preterm labor symptoms and general obstetric precautions including but not limited to vaginal bleeding, contractions, leaking of fluid and fetal movement were reviewed in detail with the patient. Please refer to After Visit Summary for other counseling recommendations.  No Follow-up on file.   Alderson BingPickens, Sumedha Munnerlyn, MD

## 2017-08-02 NOTE — L&D Delivery Note (Signed)
Patient: Stacey MoselleFatima Carrillo MRN: [id number]  GBS status: negative  Patient is a 27 y.o. now [id number] s/p NSVD at 2591w4d, who was admitted for SOL. SROM 2h 8166m prior to delivery with fluid. Terminal meconium at time of delivery.  Delivery Note At 4:55 AM a viable female was delivered via Vaginal, Spontaneous (Presentation: vertex ; ROA ).  APGAR: 8, 9; weight  pending.   Placenta status: intact, .  Cord: 3-vessel  Head delivered ROA. Nuchal cord x2 present, and reduced, then shoulder and body delivered in usual fashion. Infant with spontaneous cry, placed on mother's abdomen, dried and bulb suctioned. Cord clamped x 2 after 1-minute delay, and cut by family member. Cord blood drawn. Placenta delivered spontaneously with gentle cord traction. Fundus firm with massage and Pitocin. Perineum inspected and found to have no lacerations.  Anesthesia:  Epidural Episiotomy: None Lacerations: None Suture Repair: none Est. Blood Loss (mL): 350  Mom to postpartum.  Baby to Couplet care / Skin to Skin.  Raynelle FanningJulie P. Avir Deruiter, MD OB Fellow 10/24/17, 5:15 AM

## 2017-08-04 ENCOUNTER — Ambulatory Visit (INDEPENDENT_AMBULATORY_CARE_PROVIDER_SITE_OTHER): Payer: Medicaid Other | Admitting: Family Medicine

## 2017-08-04 VITALS — BP 104/64 | HR 72 | Wt 198.0 lb

## 2017-08-04 DIAGNOSIS — B181 Chronic viral hepatitis B without delta-agent: Secondary | ICD-10-CM

## 2017-08-04 DIAGNOSIS — O0992 Supervision of high risk pregnancy, unspecified, second trimester: Secondary | ICD-10-CM

## 2017-08-04 DIAGNOSIS — O0990 Supervision of high risk pregnancy, unspecified, unspecified trimester: Secondary | ICD-10-CM

## 2017-08-04 DIAGNOSIS — Z641 Problems related to multiparity: Secondary | ICD-10-CM

## 2017-08-04 NOTE — Progress Notes (Signed)
Needs tdap

## 2017-08-04 NOTE — Patient Instructions (Signed)
 Third Trimester of Pregnancy The third trimester is from week 28 through week 40 (months 7 through 9). The third trimester is a time when the unborn baby (fetus) is growing rapidly. At the end of the ninth month, the fetus is about 20 inches in length and weighs 6-10 pounds. Body changes during your third trimester Your body will continue to go through many changes during pregnancy. The changes vary from woman to woman. During the third trimester:  Your weight will continue to increase. You can expect to gain 25-35 pounds (11-16 kg) by the end of the pregnancy.  You may begin to get stretch marks on your hips, abdomen, and breasts.  You may urinate more often because the fetus is moving lower into your pelvis and pressing on your bladder.  You may develop or continue to have heartburn. This is caused by increased hormones that slow down muscles in the digestive tract.  You may develop or continue to have constipation because increased hormones slow digestion and cause the muscles that push waste through your intestines to relax.  You may develop hemorrhoids. These are swollen veins (varicose veins) in the rectum that can itch or be painful.  You may develop swollen, bulging veins (varicose veins) in your legs.  You may have increased body aches in the pelvis, back, or thighs. This is due to weight gain and increased hormones that are relaxing your joints.  You may have changes in your hair. These can include thickening of your hair, rapid growth, and changes in texture. Some women also have hair loss during or after pregnancy, or hair that feels dry or thin. Your hair will most likely return to normal after your baby is born.  Your breasts will continue to grow and they will continue to become tender. A yellow fluid (colostrum) may leak from your breasts. This is the first milk you are producing for your baby.  Your belly button may stick out.  You may notice more swelling in your  hands, face, or ankles.  You may have increased tingling or numbness in your hands, arms, and legs. The skin on your belly may also feel numb.  You may feel short of breath because of your expanding uterus.  You may have more problems sleeping. This can be caused by the size of your belly, increased need to urinate, and an increase in your body's metabolism.  You may notice the fetus "dropping," or moving lower in your abdomen (lightening).  You may have increased vaginal discharge.  You may notice your joints feel loose and you may have pain around your pelvic bone.  What to expect at prenatal visits You will have prenatal exams every 2 weeks until week 36. Then you will have weekly prenatal exams. During a routine prenatal visit:  You will be weighed to make sure you and the baby are growing normally.  Your blood pressure will be taken.  Your abdomen will be measured to track your baby's growth.  The fetal heartbeat will be listened to.  Any test results from the previous visit will be discussed.  You may have a cervical check near your due date to see if your cervix has softened or thinned (effaced).  You will be tested for Group B streptococcus. This happens between 35 and 37 weeks.  Your health care provider may ask you:  What your birth plan is.  How you are feeling.  If you are feeling the baby move.  If you have   had any abnormal symptoms, such as leaking fluid, bleeding, severe headaches, or abdominal cramping.  If you are using any tobacco products, including cigarettes, chewing tobacco, and electronic cigarettes.  If you have any questions.  Other tests or screenings that may be performed during your third trimester include:  Blood tests that check for low iron levels (anemia).  Fetal testing to check the health, activity level, and growth of the fetus. Testing is done if you have certain medical conditions or if there are problems during the  pregnancy.  Nonstress test (NST). This test checks the health of your baby to make sure there are no signs of problems, such as the baby not getting enough oxygen. During this test, a belt is placed around your belly. The baby is made to move, and its heart rate is monitored during movement.  What is false labor? False labor is a condition in which you feel small, irregular tightenings of the muscles in the womb (contractions) that usually go away with rest, changing position, or drinking water. These are called Braxton Hicks contractions. Contractions may last for hours, days, or even weeks before true labor sets in. If contractions come at regular intervals, become more frequent, increase in intensity, or become painful, you should see your health care provider. What are the signs of labor?  Abdominal cramps.  Regular contractions that start at 10 minutes apart and become stronger and more frequent with time.  Contractions that start on the top of the uterus and spread down to the lower abdomen and back.  Increased pelvic pressure and dull back pain.  A watery or bloody mucus discharge that comes from the vagina.  Leaking of amniotic fluid. This is also known as your "water breaking." It could be a slow trickle or a gush. Let your health care provider know if it has a color or strange odor. If you have any of these signs, call your health care provider right away, even if it is before your due date. Follow these instructions at home: Medicines  Follow your health care provider's instructions regarding medicine use. Specific medicines may be either safe or unsafe to take during pregnancy.  Take a prenatal vitamin that contains at least 600 micrograms (mcg) of folic acid.  If you develop constipation, try taking a stool softener if your health care provider approves. Eating and drinking  Eat a balanced diet that includes fresh fruits and vegetables, whole grains, good sources of protein  such as meat, eggs, or tofu, and low-fat dairy. Your health care provider will help you determine the amount of weight gain that is right for you.  Avoid raw meat and uncooked cheese. These carry germs that can cause birth defects in the baby.  If you have low calcium intake from food, talk to your health care provider about whether you should take a daily calcium supplement.  Eat four or five small meals rather than three large meals a day.  Limit foods that are high in fat and processed sugars, such as fried and sweet foods.  To prevent constipation: ? Drink enough fluid to keep your urine clear or pale yellow. ? Eat foods that are high in fiber, such as fresh fruits and vegetables, whole grains, and beans. Activity  Exercise only as directed by your health care provider. Most women can continue their usual exercise routine during pregnancy. Try to exercise for 30 minutes at least 5 days a week. Stop exercising if you experience uterine contractions.  Avoid   heavy lifting.  Do not exercise in extreme heat or humidity, or at high altitudes.  Wear low-heel, comfortable shoes.  Practice good posture.  You may continue to have sex unless your health care provider tells you otherwise. Relieving pain and discomfort  Take frequent breaks and rest with your legs elevated if you have leg cramps or low back pain.  Take warm sitz baths to soothe any pain or discomfort caused by hemorrhoids. Use hemorrhoid cream if your health care provider approves.  Wear a good support bra to prevent discomfort from breast tenderness.  If you develop varicose veins: ? Wear support pantyhose or compression stockings as told by your healthcare provider. ? Elevate your feet for 15 minutes, 3-4 times a day. Prenatal care  Write down your questions. Take them to your prenatal visits.  Keep all your prenatal visits as told by your health care provider. This is important. Safety  Wear your seat belt at  all times when driving.  Make a list of emergency phone numbers, including numbers for family, friends, the hospital, and police and fire departments. General instructions  Avoid cat litter boxes and soil used by cats. These carry germs that can cause birth defects in the baby. If you have a cat, ask someone to clean the litter box for you.  Do not travel far distances unless it is absolutely necessary and only with the approval of your health care provider.  Do not use hot tubs, steam rooms, or saunas.  Do not drink alcohol.  Do not use any products that contain nicotine or tobacco, such as cigarettes and e-cigarettes. If you need help quitting, ask your health care provider.  Do not use any medicinal herbs or unprescribed drugs. These chemicals affect the formation and growth of the baby.  Do not douche or use tampons or scented sanitary pads.  Do not cross your legs for long periods of time.  To prepare for the arrival of your baby: ? Take prenatal classes to understand, practice, and ask questions about labor and delivery. ? Make a trial run to the hospital. ? Visit the hospital and tour the maternity area. ? Arrange for maternity or paternity leave through employers. ? Arrange for family and friends to take care of pets while you are in the hospital. ? Purchase a rear-facing car seat and make sure you know how to install it in your car. ? Pack your hospital bag. ? Prepare the baby's nursery. Make sure to remove all pillows and stuffed animals from the baby's crib to prevent suffocation.  Visit your dentist if you have not gone during your pregnancy. Use a soft toothbrush to brush your teeth and be gentle when you floss. Contact a health care provider if:  You are unsure if you are in labor or if your water has broken.  You become dizzy.  You have mild pelvic cramps, pelvic pressure, or nagging pain in your abdominal area.  You have lower back pain.  You have persistent  nausea, vomiting, or diarrhea.  You have an unusual or bad smelling vaginal discharge.  You have pain when you urinate. Get help right away if:  Your water breaks before 37 weeks.  You have regular contractions less than 5 minutes apart before 37 weeks.  You have a fever.  You are leaking fluid from your vagina.  You have spotting or bleeding from your vagina.  You have severe abdominal pain or cramping.  You have rapid weight loss or weight   gain.  You have shortness of breath with chest pain.  You notice sudden or extreme swelling of your face, hands, ankles, feet, or legs.  Your baby makes fewer than 10 movements in 2 hours.  You have severe headaches that do not go away when you take medicine.  You have vision changes. Summary  The third trimester is from week 28 through week 40, months 7 through 9. The third trimester is a time when the unborn baby (fetus) is growing rapidly.  During the third trimester, your discomfort may increase as you and your baby continue to gain weight. You may have abdominal, leg, and back pain, sleeping problems, and an increased need to urinate.  During the third trimester your breasts will keep growing and they will continue to become tender. A yellow fluid (colostrum) may leak from your breasts. This is the first milk you are producing for your baby.  False labor is a condition in which you feel small, irregular tightenings of the muscles in the womb (contractions) that eventually go away. These are called Braxton Hicks contractions. Contractions may last for hours, days, or even weeks before true labor sets in.  Signs of labor can include: abdominal cramps; regular contractions that start at 10 minutes apart and become stronger and more frequent with time; watery or bloody mucus discharge that comes from the vagina; increased pelvic pressure and dull back pain; and leaking of amniotic fluid. This information is not intended to replace advice  given to you by your health care provider. Make sure you discuss any questions you have with your health care provider. Document Released: 07/13/2001 Document Revised: 12/25/2015 Document Reviewed: 09/19/2012 Elsevier Interactive Patient Education  2017 [employer].   Contraception Choices Contraception, also called birth control, refers to methods or devices that prevent pregnancy. Hormonal methods Contraceptive implant A contraceptive implant is a thin, plastic tube that contains a hormone. It is inserted into the upper part of the arm. It can remain in place for up to 3 years. Progestin-only injections Progestin-only injections are injections of progestin, a synthetic form of the hormone progesterone. They are given every 3 months by a health care provider. Birth control pills Birth control pills are pills that contain hormones that prevent pregnancy. They must be taken once a day, preferably at the same time each day. Birth control patch The birth control patch contains hormones that prevent pregnancy. It is placed on the skin and must be changed once a week for three weeks and removed on the fourth week. A prescription is needed to use this method of contraception. Vaginal ring A vaginal ring contains hormones that prevent pregnancy. It is placed in the vagina for three weeks and removed on the fourth week. After that, the process is repeated with a new ring. A prescription is needed to use this method of contraception. Emergency contraceptive Emergency contraceptives prevent pregnancy after unprotected sex. They come in pill form and can be taken up to 5 days after sex. They work best the sooner they are taken after having sex. Most emergency contraceptives are available without a prescription. This method should not be used as your only form of birth control. Barrier methods Female condom A female condom is a thin sheath that is worn over the penis during sex. Condoms keep sperm from going  inside a woman's body. They can be used with a spermicide to increase their effectiveness. They should be disposed after a single use. Female condom A female condom is a  soft, loose-fitting sheath that is put into the vagina before sex. The condom keeps sperm from going inside a woman's body. They should be disposed after a single use. Diaphragm A diaphragm is a soft, dome-shaped barrier. It is inserted into the vagina before sex, along with a spermicide. The diaphragm blocks sperm from entering the uterus, and the spermicide kills sperm. A diaphragm should be left in the vagina for 6-8 hours after sex and removed within 24 hours. A diaphragm is prescribed and fitted by a health care provider. A diaphragm should be replaced every 1-2 years, after giving birth, after gaining more than 15 lb (6.8 kg), and after pelvic surgery. Cervical cap A cervical cap is a round, soft latex or plastic cup that fits over the cervix. It is inserted into the vagina before sex, along with spermicide. It blocks sperm from entering the uterus. The cap should be left in place for 6-8 hours after sex and removed within 48 hours. A cervical cap must be prescribed and fitted by a health care provider. It should be replaced every 2 years. Sponge A sponge is a soft, circular piece of polyurethane foam with spermicide on it. The sponge helps block sperm from entering the uterus, and the spermicide kills sperm. To use it, you make it wet and then insert it into the vagina. It should be inserted before sex, left in for at least 6 hours after sex, and removed and thrown away within 30 hours. Spermicides Spermicides are chemicals that kill or block sperm from entering the cervix and uterus. They can come as a cream, jelly, suppository, foam, or tablet. A spermicide should be inserted into the vagina with an applicator at least 27-06 minutes before sex to allow time for it to work. The process must be repeated every time you have sex.  Spermicides do not require a prescription. Intrauterine contraception Intrauterine device (IUD) An IUD is a T-shaped device that is put in a woman's uterus. There are two types:  Hormone IUD.This type contains progestin, a synthetic form of the hormone progesterone. This type can stay in place for 3-5 years.  Copper IUD.This type is wrapped in copper wire. It can stay in place for 10 years.  Permanent methods of contraception Female tubal ligation In this method, a woman's fallopian tubes are sealed, tied, or blocked during surgery to prevent eggs from traveling to the uterus. Hysteroscopic sterilization In this method, a small, flexible insert is placed into each fallopian tube. The inserts cause scar tissue to form in the fallopian tubes and block them, so sperm cannot reach an egg. The procedure takes about 3 months to be effective. Another form of birth control must be used during those 3 months. Female sterilization This is a procedure to tie off the tubes that carry sperm (vasectomy). After the procedure, the man can still ejaculate fluid (semen). Natural planning methods Natural family planning In this method, a couple does not have sex on days when the woman could become pregnant. Calendar method This means keeping track of the length of each menstrual cycle, identifying the days when pregnancy can happen, and not having sex on those days. Ovulation method In this method, a couple avoids sex during ovulation. Symptothermal method This method involves not having sex during ovulation. The woman typically checks for ovulation by watching changes in her temperature and in the consistency of cervical mucus. Post-ovulation method In this method, a couple waits to have sex until after ovulation. Summary  Contraception, also  called birth control, means methods or devices that prevent pregnancy.  Hormonal methods of contraception include implants, injections, pills, patches, vaginal  rings, and emergency contraceptives.  Barrier methods of contraception can include female condoms, female condoms, diaphragms, cervical caps, sponges, and spermicides.  There are two types of IUDs (intrauterine devices). An IUD can be put in a woman's uterus to prevent pregnancy for 3-5 years.  Permanent sterilization can be done through a procedure for males, females, or both.  Natural family planning methods involve not having sex on days when the woman could become pregnant. This information is not intended to replace advice given to you by your health care provider. Make sure you discuss any questions you have with your health care provider. Document Released: 07/19/2005 Document Revised: 08/21/2016 Document Reviewed: 08/21/2016 Elsevier Interactive Patient Education  2018 [employer]  

## 2017-08-04 NOTE — Progress Notes (Signed)
    PRENATAL VISIT NOTE  Subjective:  Stacey Carrillo is a 27 y.o. [id number] at 6363w0d being seen today for ongoing prenatal care.  She is currently monitored for the following issues for this low-risk pregnancy and has Chronic hepatitis B (HCC); Severe dysplasia of cervix (CIN III); Vaginal bleeding in pregnancy, first trimester; Subchorionic hematoma in first trimester; Grand multiparity; Short interval between pregnancies affecting pregnancy in first trimester, antepartum; H/O LEEP (loop electrosurgical excision procedure) of cervix complicating pregnancy; Supervision of high risk pregnancy, antepartum; and Fetal arrhythmia affecting pregnancy, antepartum on their problem list.  Patient reports no complaints.  Contractions: Not present.  .  Movement: Present. Denies leaking of fluid.   The following portions of the patient's history were reviewed and updated as appropriate: allergies, current medications, past family history, past medical history, past social history, past surgical history and problem list. Problem list updated.  Objective:   Vitals:   08/04/17 0823  BP: 104/64  Pulse: 72  Weight: 198 lb (89.8 kg)    Fetal Status: Fetal Heart Rate (bpm): 131 Fundal Height: 28 cm Movement: Present     General:  Alert, oriented and cooperative. Patient is in no acute distress.  Skin: Skin is warm and dry. No rash noted.   Cardiovascular: Normal heart rate noted  Respiratory: Normal respiratory effort, no problems with respiration noted  Abdomen: Soft, gravid, appropriate for gestational age.  Pain/Pressure: Present     Pelvic: Cervical exam deferred        Extremities: Normal range of motion.  Edema: None  Mental Status:  Normal mood and affect. Normal behavior. Normal judgment and thought content.   Assessment and Plan:  Pregnancy: [id number] at 2563w0d  1. Supervision of high risk pregnancy, antepartum, second trimester 28 wk labs today--missed TDaP--to get next visit - CBC - RPR -  Glucose Tolerance, 2 Hours w/1 Hour - HIV antibody   2. Chronic hepatitis B (HCC) Repeat Hep B labs ordered - Hepatitis B DNA, ultraquantitative, PCR - INR/PT - PTT - Fibrinogen  3. Grand multiparity States husband will likely get vasectomy this time.  Preterm labor symptoms and general obstetric precautions including but not limited to vaginal bleeding, contractions, leaking of fluid and fetal movement were reviewed in detail with the patient. Please refer to After Visit Summary for other counseling recommendations.  Return in 2 weeks (on 08/18/2017).   Reva Boresanya S Kaisei Gilbo, MD

## 2017-08-05 LAB — RPR: RPR: NONREACTIVE

## 2017-08-05 LAB — FIBRINOGEN: FIBRINOGEN: 454 mg/dL (ref 193–507)

## 2017-08-05 LAB — CBC
Hematocrit: 37.1 % (ref 34.0–46.6)
Hemoglobin: 12.1 g/dL (ref 11.1–15.9)
MCH: 25.2 pg — ABNORMAL LOW (ref 26.6–33.0)
MCHC: 32.6 g/dL (ref 31.5–35.7)
MCV: 77 fL — ABNORMAL LOW (ref 79–97)
Platelets: 167 10*3/uL (ref 150–379)
RBC: 4.8 x10E6/uL (ref 3.77–5.28)
RDW: 14.6 % (ref 12.3–15.4)
WBC: 7.7 10*3/uL (ref 3.4–10.8)

## 2017-08-05 LAB — HEPATITIS B DNA, ULTRAQUANTITATIVE, PCR
HBV DNA SERPL PCR-ACNC: 20 IU/mL
HBV DNA SERPL PCR-LOG IU: 1.301 log10 IU/mL

## 2017-08-05 LAB — APTT: APTT: 33 s (ref 24–33)

## 2017-08-05 LAB — HIV ANTIBODY (ROUTINE TESTING W REFLEX): HIV Screen 4th Generation wRfx: NONREACTIVE

## 2017-08-05 LAB — PROTIME-INR
INR: 1 (ref 0.8–1.2)
PROTHROMBIN TIME: 10.3 s (ref 9.1–12.0)

## 2017-08-05 LAB — GLUCOSE TOLERANCE, 2 HOURS W/ 1HR
GLUCOSE, FASTING: 79 mg/dL (ref 65–91)
Glucose, 1 hour: 141 mg/dL (ref 65–179)
Glucose, 2 hour: 134 mg/dL (ref 65–152)

## 2017-08-17 ENCOUNTER — Encounter: Payer: Medicaid Other | Admitting: Obstetrics and Gynecology

## 2017-08-23 ENCOUNTER — Encounter: Payer: Medicaid Other | Admitting: Obstetrics & Gynecology

## 2017-08-25 ENCOUNTER — Ambulatory Visit (INDEPENDENT_AMBULATORY_CARE_PROVIDER_SITE_OTHER): Payer: Medicaid Other | Admitting: Obstetrics & Gynecology

## 2017-08-25 ENCOUNTER — Encounter: Payer: Self-pay | Admitting: Obstetrics & Gynecology

## 2017-08-25 VITALS — BP 120/75 | HR 105 | Wt 195.0 lb

## 2017-08-25 DIAGNOSIS — O368390 Maternal care for abnormalities of the fetal heart rate or rhythm, unspecified trimester, not applicable or unspecified: Secondary | ICD-10-CM

## 2017-08-25 DIAGNOSIS — O09891 Supervision of other high risk pregnancies, first trimester: Secondary | ICD-10-CM

## 2017-08-25 DIAGNOSIS — D069 Carcinoma in situ of cervix, unspecified: Secondary | ICD-10-CM

## 2017-08-25 DIAGNOSIS — Z641 Problems related to multiparity: Secondary | ICD-10-CM

## 2017-08-25 DIAGNOSIS — B181 Chronic viral hepatitis B without delta-agent: Secondary | ICD-10-CM

## 2017-08-25 DIAGNOSIS — O0990 Supervision of high risk pregnancy, unspecified, unspecified trimester: Secondary | ICD-10-CM

## 2017-08-25 NOTE — Patient Instructions (Signed)
Natural Childbirth Natural childbirth is going through labor and delivery without any drugs to relieve pain. Additionally, fetal monitors are not used, a delivery is not done, and a surgical cut to enlarge the vaginal opening (episiotomy) is not made. With the help of a birthing professional (midwife or health care provider), you direct your own labor and delivery. Many women choose natural childbirth because it makes them feel more in control and in touch with their labor and delivery. Some woman also choose natural childbirth because they are concerned about medicines affecting them and their babies. Pregnant women with a high-risk pregnancy should not attempt natural childbirth. It is better to deliver the infant in a hospital if an emergency situation arises. Sometimes, a health care provider has to get involved for the health and safety of the mother and infant. Techniques for natural childbirth  The Lamaze method-This method teaches parents that having a baby is normal, healthy, and natural. It also teaches the mother to take a neutral position regarding pain medicine and numbing medicines and to make an informed decision about using these medicines when the time comes.  The Bradley method (also called husband-coached birth)-This method teaches the father or partner to be the birth coach. It encourages the mother to exercise and eat a balanced, nutritious diet. It also involves relaxation and deep breathing exercises and preparing the parents for emergency situations. Methods of dealing with labor pain and delivery  Meditation.  Yoga.  Hypnosis.  Acupuncture.  Massage.  Changing positions (walking, rocking, showering, leaning on birth balls).  Lying in warm water or a whirlpool bath.  Finding an activity that keeps your mind off of the labor pain.  Listening to soft music.  Focusing on a particular object (visual imagery). Before going into labor  Be sure you and your spouse or  partner are in agreement about having a natural childbirth.  Decide if your health care provider or a midwife will deliver your baby.  Decide if you will have your baby in the hospital, at a birthing center, or at home.  If you have children, make plans to have someone take care of them when you go to the hospital or birthing center.  Know the distance and the time it takes to go to the hospital or birthing center. Practice going there and time it to be sure.  Have a bag packed with a nightgown, bathrobe, and toiletries. Be ready to take it with you when you go into labor.  Keep phone numbers of your family and friends handy if you need to call someone when you go into labor.  Your spouse or partner should go to all the natural childbirth technique classes.  Talk with your health care provider about the possibility of a medical emergency and what will happen if that occurs. Advantages of natural childbirth  You are in control of your labor and delivery.  You will not take medicines that could affect you and the baby.  There are no invasive procedures, such as an episiotomy.  You and your spouse or partner will work together, which can increase your bond with each other.  In most delivery centers, your family and friends can be involved in the labor and delivery process. Disadvantages of natural childbirth  You will experience pain during your labor and delivery.  The methods of helping relieve your labor pains may not work for you.  You may feel disappointed if you decide to change your mind during labor and not   have a natural childbirth. After the delivery  You will be very tired.  You will be uncomfortable because of your uterus contracting. You will feel soreness around the vagina.  You may feel cold and shaky. This is a natural reaction. This information is not intended to replace advice given to you by your health care provider. Make sure you discuss any questions you  have with your health care provider. Document Released: 07/01/2008 Document Revised: 12/25/2015 Document Reviewed: 03/26/2013 Elsevier Interactive Patient Education  2017 Elsevier Inc[employer]  

## 2017-08-25 NOTE — Progress Notes (Signed)
   PRENATAL VISIT NOTE  Subjective:  Stacey Carrillo is a 27 y.o. [id number] at 786w0d being seen today for ongoing prenatal care.  She is currently monitored for the following issues for this high-risk pregnancy and has Chronic hepatitis B (HCC); Severe dysplasia of cervix (CIN III); Vaginal bleeding in pregnancy, first trimester; Subchorionic hematoma in first trimester; Grand multiparity; Short interval between pregnancies affecting pregnancy in first trimester, antepartum; H/O LEEP (loop electrosurgical excision procedure) of cervix complicating pregnancy; Supervision of high risk pregnancy, antepartum; and Fetal arrhythmia affecting pregnancy, antepartum on their problem list.  Patient reports no complaints.  Contractions: Irritability. Vag. Bleeding: None.  Movement: Present. Denies leaking of fluid.   The following portions of the patient's history were reviewed and updated as appropriate: allergies, current medications, past family history, past medical history, past social history, past surgical history and problem list. Problem list updated.  Objective:   Vitals:   08/25/17 1057  BP: 120/75  Pulse: (!) 105  Weight: 195 lb (88.5 kg)    Fetal Status: Fetal Heart Rate (bpm): 150   Movement: Present     General:  Alert, oriented and cooperative. Patient is in no acute distress.  Skin: Skin is warm and dry. No rash noted.   Cardiovascular: Normal heart rate noted  Respiratory: Normal respiratory effort, no problems with respiration noted  Abdomen: Soft, gravid, appropriate for gestational age.  Pain/Pressure: Present     Pelvic: Cervical exam deferred        Extremities: Normal range of motion.  Edema: None  Mental Status:  Normal mood and affect. Normal behavior. Normal judgment and thought content.   Assessment and Plan:  Pregnancy: [id number] at 636w0d  1. Supervision of high risk pregnancy, antepartum  2. Short interval between pregnancies affecting pregnancy in first trimester,  antepartum  3. Severe dysplasia of cervix (CIN III) S/p LEEP  4. Chronic hepatitis B (HCC) stable  5. Fetal arrhythmia affecting pregnancy, antepartum  6. Grand multiparity  Preterm labor symptoms and general obstetric precautions including but not limited to vaginal bleeding, contractions, leaking of fluid and fetal movement were reviewed in detail with the patient. Please refer to After Visit Summary for other counseling recommendations.  Return in about 2 weeks (around 09/08/2017).   Willodean Rosenthalarolyn Harraway-Smith, MD

## 2017-09-07 ENCOUNTER — Encounter: Payer: Self-pay | Admitting: Obstetrics & Gynecology

## 2017-09-07 ENCOUNTER — Ambulatory Visit (INDEPENDENT_AMBULATORY_CARE_PROVIDER_SITE_OTHER): Payer: Medicaid Other | Admitting: Obstetrics & Gynecology

## 2017-09-07 VITALS — BP 103/65 | HR 94 | Wt 195.0 lb

## 2017-09-07 DIAGNOSIS — O0993 Supervision of high risk pregnancy, unspecified, third trimester: Secondary | ICD-10-CM

## 2017-09-07 DIAGNOSIS — Z23 Encounter for immunization: Secondary | ICD-10-CM | POA: Diagnosis not present

## 2017-09-07 DIAGNOSIS — O0990 Supervision of high risk pregnancy, unspecified, unspecified trimester: Secondary | ICD-10-CM

## 2017-09-07 DIAGNOSIS — Z641 Problems related to multiparity: Secondary | ICD-10-CM

## 2017-09-07 DIAGNOSIS — B181 Chronic viral hepatitis B without delta-agent: Secondary | ICD-10-CM

## 2017-09-07 NOTE — Patient Instructions (Signed)
Return to clinic for any scheduled appointments or obstetric concerns, or go to [facility] for evaluation  

## 2017-09-07 NOTE — Progress Notes (Signed)
   PRENATAL VISIT NOTE  Subjective:  Stacey Carrillo is a 27 y.o. [id number] at 7111w6d being seen today for ongoing prenatal care.  She is currently monitored for the following issues for this high-risk pregnancy and has Chronic hepatitis B (HCC); Severe dysplasia of cervix (CIN III); Vaginal bleeding in pregnancy, first trimester; Subchorionic hematoma in first trimester; Grand multiparity; Short interval between pregnancies affecting pregnancy in first trimester, antepartum; H/O LEEP (loop electrosurgical excision procedure) of cervix complicating pregnancy; Supervision of high risk pregnancy, antepartum; and Fetal arrhythmia affecting pregnancy, antepartum on their problem list.  Patient reports no complaints.  Contractions: Irritability.  .  Movement: Present. Denies leaking of fluid.   The following portions of the patient's history were reviewed and updated as appropriate: allergies, current medications, past family history, past medical history, past social history, past surgical history and problem list. Problem list updated.  Objective:   Vitals:   09/07/17 1137  BP: 103/65  Pulse: 94  Weight: 195 lb (88.5 kg)    Fetal Status: Fetal Heart Rate (bpm): 135 Fundal Height: 32 cm Movement: Present     General:  Alert, oriented and cooperative. Patient is in no acute distress.  Skin: Skin is warm and dry. No rash noted.   Cardiovascular: Normal heart rate noted  Respiratory: Normal respiratory effort, no problems with respiration noted  Abdomen: Soft, gravid, appropriate for gestational age.  Pain/Pressure: Present     Pelvic: Cervical exam deferred        Extremities: Normal range of motion.  Edema: None  Mental Status:  Normal mood and affect. Normal behavior. Normal judgment and thought content.   Assessment and Plan:  Pregnancy: [id number] at 7011w6d  1. Grand multiparity  2. Chronic hepatitis B (HCC) Stable RNA counts and LFTs. Will recheck CMET at admission.  3. Supervision of high  risk pregnancy, antepartum - Tdap vaccine greater than or equal to 7yo IM  Preterm labor symptoms and general obstetric precautions including but not limited to vaginal bleeding, contractions, leaking of fluid and fetal movement were reviewed in detail with the patient. Please refer to After Visit Summary for other counseling recommendations.  Return in about 2 weeks (around 09/21/2017) for OB Visit.   Jaynie CollinsUgonna Renay Crammer, MD

## 2017-09-21 ENCOUNTER — Encounter: Payer: Medicaid Other | Admitting: Family Medicine

## 2017-09-23 ENCOUNTER — Ambulatory Visit (INDEPENDENT_AMBULATORY_CARE_PROVIDER_SITE_OTHER): Payer: Medicaid Other | Admitting: Obstetrics and Gynecology

## 2017-09-23 VITALS — BP 111/68 | HR 103 | Wt 196.0 lb

## 2017-09-23 DIAGNOSIS — O09893 Supervision of other high risk pregnancies, third trimester: Secondary | ICD-10-CM

## 2017-09-23 DIAGNOSIS — Z9889 Other specified postprocedural states: Secondary | ICD-10-CM

## 2017-09-23 DIAGNOSIS — B181 Chronic viral hepatitis B without delta-agent: Secondary | ICD-10-CM

## 2017-09-23 DIAGNOSIS — O09891 Supervision of other high risk pregnancies, first trimester: Secondary | ICD-10-CM

## 2017-09-23 DIAGNOSIS — D069 Carcinoma in situ of cervix, unspecified: Secondary | ICD-10-CM

## 2017-09-23 DIAGNOSIS — O3443 Maternal care for other abnormalities of cervix, third trimester: Secondary | ICD-10-CM

## 2017-09-23 DIAGNOSIS — O0990 Supervision of high risk pregnancy, unspecified, unspecified trimester: Secondary | ICD-10-CM

## 2017-09-23 DIAGNOSIS — O0993 Supervision of high risk pregnancy, unspecified, third trimester: Secondary | ICD-10-CM

## 2017-09-24 NOTE — Progress Notes (Signed)
Prenatal Visit Note Date: 09/23/2017 Clinic: [facility]  Subjective:  Stacey Carrillo is a 27 y.o. [id number] at 269w1d being seen today for ongoing prenatal care.  She is currently monitored for the following issues for this high-risk pregnancy and has Chronic hepatitis B (HCC); Severe dysplasia of cervix (CIN III); Vaginal bleeding in pregnancy, first trimester; Subchorionic hematoma in first trimester; Grand multiparity; Short interval between pregnancies affecting pregnancy in first trimester, antepartum; H/O LEEP (loop electrosurgical excision procedure) of cervix complicating pregnancy; Supervision of high risk pregnancy, antepartum; and Fetal arrhythmia affecting pregnancy, antepartum on their problem list.  Patient reports no complaints.   Contractions: Irregular. Vag. Bleeding: None.  Movement: Present. Denies leaking of fluid.   The following portions of the patient's history were reviewed and updated as appropriate: allergies, current medications, past family history, past medical history, past social history, past surgical history and problem list. Problem list updated.  Objective:   Vitals:   09/23/17 1124  BP: 111/68  Pulse: (!) 103  Weight: 196 lb (88.9 kg)    Fetal Status: Fetal Heart Rate (bpm): 141 Fundal Height: 35 cm Movement: Present  Presentation: Vertex  General:  Alert, oriented and cooperative. Patient is in no acute distress.  Skin: Skin is warm and dry. No rash noted.   Cardiovascular: Normal heart rate noted  Respiratory: Normal respiratory effort, no problems with respiration noted  Abdomen: Soft, gravid, appropriate for gestational age. Pain/Pressure: Present     Pelvic:  Cervical exam deferred        Extremities: Normal range of motion.  Edema: None  Mental Status: Normal mood and affect. Normal behavior. Normal judgment and thought content.   Urinalysis:      Assessment and Plan:  Pregnancy: [id number] at 349w1d  1. Chronic hepatitis B  (HCC) Low VL in January. No issues  2. History of loop electrosurgical excision procedure (LEEP) of cervix affecting pregnancy in third trimester   3. Severe dysplasia of cervix (CIN III) Pap nv  4. Short interval between pregnancies affecting pregnancy in first trimester, antepartum  5. Supervision of high risk pregnancy, antepartum GBS nv. depo  Preterm labor symptoms and general obstetric precautions including but not limited to vaginal bleeding, contractions, leaking of fluid and fetal movement were reviewed in detail with the patient. Please refer to After Visit Summary for other counseling recommendations.  Return in about 1 week (around 09/30/2017) for rob, gbs, pap smear.   [place] BingPickens, Karleigh Bunte, MD

## 2017-09-29 ENCOUNTER — Ambulatory Visit (INDEPENDENT_AMBULATORY_CARE_PROVIDER_SITE_OTHER): Payer: Medicaid Other | Admitting: Obstetrics and Gynecology

## 2017-09-29 ENCOUNTER — Other Ambulatory Visit (HOSPITAL_COMMUNITY)
Admission: RE | Admit: 2017-09-29 | Discharge: 2017-09-29 | Disposition: A | Discharge status: Home / Self Care | Payer: Medicaid Other | Source: Ambulatory Visit | Attending: Obstetrics and Gynecology | Admitting: Obstetrics and Gynecology

## 2017-09-29 VITALS — BP 101/67 | HR 100 | Wt 196.8 lb

## 2017-09-29 DIAGNOSIS — O0990 Supervision of high risk pregnancy, unspecified, unspecified trimester: Secondary | ICD-10-CM | POA: Insufficient documentation

## 2017-09-29 DIAGNOSIS — D069 Carcinoma in situ of cervix, unspecified: Secondary | ICD-10-CM | POA: Diagnosis not present

## 2017-09-29 NOTE — Progress Notes (Signed)
Prenatal Visit Note Date: 09/29/2017 Clinic: [facility]  Subjective:  Stacey MoselleFatima Carrillo is a 27 y.o. [id number] at 8167w0d being seen today for ongoing prenatal care.  She is currently monitored for the following issues for this high-risk pregnancy and has Chronic hepatitis B (HCC); Severe dysplasia of cervix (CIN III); Grand multiparity; Short interval between pregnancies affecting pregnancy in first trimester, antepartum; H/O LEEP (loop electrosurgical excision procedure) of cervix complicating pregnancy; Supervision of high risk pregnancy, antepartum; and Fetal arrhythmia affecting pregnancy, antepartum on their problem list.  Patient reports no complaints.   Contractions: Not present.  .  Movement: Present. Denies leaking of fluid.   The following portions of the patient's history were reviewed and updated as appropriate: allergies, current medications, past family history, past medical history, past social history, past surgical history and problem list. Problem list updated.  Objective:   Vitals:   09/29/17 1139  BP: 101/67  Pulse: 100  Weight: 196 lb 12.8 oz (89.3 kg)    Fetal Status: Fetal Heart Rate (bpm): 154 Fundal Height: 36 cm Movement: Present  Presentation: Vertex  General:  Alert, oriented and cooperative. Patient is in no acute distress.  Skin: Skin is warm and dry. No rash noted.   Cardiovascular: Normal heart rate noted  Respiratory: Normal respiratory effort, no problems with respiration noted  Abdomen: Soft, gravid, appropriate for gestational age. Pain/Pressure: Present     Pelvic:  EGBUS normal. Cystocele present just inside introitus. Cervix very anterior. Looks normal.   Extremities: Normal range of motion.  Edema: None  Mental Status: Normal mood and affect. Normal behavior. Normal judgment and thought content.   Urinalysis:      Assessment and Plan:  Pregnancy: [id number] at 1367w0d  1. Chronic hepatitis B (HCC) Low VL in January. No  issues  2. History of loop electrosurgical excision procedure (LEEP) of cervix affecting pregnancy in third trimester   3. Severe dysplasia of cervix (CIN III) Pap today. May have to repeat pp if didn't get good sample  4. Short interval between pregnancies affecting pregnancy in first trimester, antepartum  5. Supervision of high risk pregnancy, antepartum Gbs, gc/ct, depo   Preterm labor symptoms and general obstetric precautions including but not limited to vaginal bleeding, contractions, leaking of fluid and fetal movement were reviewed in detail with the patient. Please refer to After Visit Summary for other counseling recommendations.  Return in about 1 week (around 10/06/2017) for 7-10d rob.   [place] BingPickens, Stacey Sarver, MD

## 2017-10-01 LAB — STREP GP B NAA: Strep Gp B NAA: NEGATIVE

## 2017-10-03 LAB — GC/CHLAMYDIA PROBE AMP (~~LOC~~) NOT AT ARMC
CHLAMYDIA, DNA PROBE: NEGATIVE
Neisseria Gonorrhea: NEGATIVE

## 2017-10-04 LAB — CYTOLOGY - PAP
Adequacy: ABSENT
DIAGNOSIS: NEGATIVE
HPV (WINDOPATH): NOT DETECTED

## 2017-10-10 ENCOUNTER — Ambulatory Visit (INDEPENDENT_AMBULATORY_CARE_PROVIDER_SITE_OTHER): Payer: Medicaid Other | Admitting: Family Medicine

## 2017-10-10 ENCOUNTER — Encounter: Payer: Self-pay | Admitting: Family Medicine

## 2017-10-10 VITALS — BP 123/69 | HR 101 | Wt 198.0 lb

## 2017-10-10 DIAGNOSIS — O0990 Supervision of high risk pregnancy, unspecified, unspecified trimester: Secondary | ICD-10-CM

## 2017-10-10 NOTE — Progress Notes (Signed)
   PRENATAL VISIT NOTE  Subjective:  Stacey Carrillo is a 27 y.o. [id number] at 4314w4d being seen today for ongoing prenatal care.  She is currently monitored for the following issues for this low-risk pregnancy and has Chronic hepatitis B (HCC); Severe dysplasia of cervix (CIN III); Grand multiparity; Short interval between pregnancies affecting pregnancy in first trimester, antepartum; H/O LEEP (loop electrosurgical excision procedure) of cervix complicating pregnancy; Supervision of high risk pregnancy, antepartum; and Fetal arrhythmia affecting pregnancy, antepartum on their problem list.  Patient reports no complaints.  Contractions: Irregular. Vag. Bleeding: None.  Movement: Present. Denies leaking of fluid.   The following portions of the patient's history were reviewed and updated as appropriate: allergies, current medications, past family history, past medical history, past social history, past surgical history and problem list. Problem list updated.  Objective:   Vitals:   10/10/17 1602  BP: 123/69  Pulse: (!) 101  Weight: 198 lb (89.8 kg)    Fetal Status: Fetal Heart Rate (bpm): 140 Fundal Height: 34 cm Movement: Present  Presentation: Vertex  General:  Alert, oriented and cooperative. Patient is in no acute distress.  Skin: Skin is warm and dry. No rash noted.   Cardiovascular: Normal heart rate noted  Respiratory: Normal respiratory effort, no problems with respiration noted  Abdomen: Soft, gravid, appropriate for gestational age.  Pain/Pressure: Present     Pelvic: Cervical exam deferred Dilation: Fingertip Effacement (%): 70 Station: -2  Extremities: Normal range of motion.  Edema: None  Mental Status:  Normal mood and affect. Normal behavior. Normal judgment and thought content.   Assessment and Plan:  Pregnancy: [id number] at 4114w4d  1. Supervision of high risk pregnancy, antepartum Continue routine prenatal care.   Term labor symptoms and general obstetric precautions  including but not limited to vaginal bleeding, contractions, leaking of fluid and fetal movement were reviewed in detail with the patient. Please refer to After Visit Summary for other counseling recommendations.  Return in 2 weeks (on 10/24/2017).   Reva Boresanya S Malu Pellegrini, MD

## 2017-10-10 NOTE — Patient Instructions (Signed)

## 2017-10-17 ENCOUNTER — Ambulatory Visit (INDEPENDENT_AMBULATORY_CARE_PROVIDER_SITE_OTHER): Payer: Medicaid Other | Admitting: Obstetrics and Gynecology

## 2017-10-17 VITALS — BP 116/77 | HR 100 | Wt 193.0 lb

## 2017-10-17 DIAGNOSIS — O0990 Supervision of high risk pregnancy, unspecified, unspecified trimester: Secondary | ICD-10-CM

## 2017-10-17 DIAGNOSIS — B181 Chronic viral hepatitis B without delta-agent: Secondary | ICD-10-CM

## 2017-10-17 DIAGNOSIS — D069 Carcinoma in situ of cervix, unspecified: Secondary | ICD-10-CM

## 2017-10-17 DIAGNOSIS — A084 Viral intestinal infection, unspecified: Secondary | ICD-10-CM

## 2017-10-17 NOTE — Progress Notes (Signed)
Prenatal Visit Note Date: 10/17/2017 [facility]  Subjective:  Stacey MoselleFatima Carrillo is a [age] y.o. [id number] at 9069w4d being seen today for ongoing prenatal care.  She is currently monitored for the following issues for this high-risk pregnancy and has Chronic hepatitis B (HCC); Severe dysplasia of cervix (CIN III); Grand multiparity; Short interval between pregnancies affecting pregnancy in first trimester, antepartum; H/O LEEP (loop electrosurgical excision procedure) of cervix complicating pregnancy; Supervision of high risk pregnancy, antepartum; and Fetal arrhythmia affecting pregnancy, antepartum on their problem list.  Patient reports nausea/vomiting/diarrhea. everyone in family has had it in the past week or so..   Contractions: Irregular. Vag. Bleeding: None.  Movement: Present. Denies leaking of fluid.   The following portions of the patient's history were reviewed and updated as appropriate: allergies, current medications, past family history, past medical history, past social history, past surgical history and problem list. Problem list updated.  Objective:   Vitals:   10/17/17 1638  BP: 116/77  Pulse: 100  Weight: 193 lb (87.5 kg)    Fetal Status: Fetal Heart Rate (bpm): 158 Fundal Height: 37 cm Movement: Present  Presentation: Vertex  General:  Alert, oriented and cooperative. Patient is in no acute distress.  Skin: Skin is warm and dry. No rash noted.   Cardiovascular: Normal heart rate noted  Respiratory: Normal respiratory effort, no problems with respiration noted  Abdomen: Soft, gravid, appropriate for gestational age. Pain/Pressure: Present     Pelvic:  Cervical exam deferred Dilation: Fingertip Effacement (%): 70 Station: -2  Extremities: Normal range of motion.  Edema: None  Mental Status: Normal mood and affect. Normal behavior. Normal judgment and thought content.   Urinalysis:      Assessment and Plan:  Pregnancy: [id number] at  6469w4d  1. Chronic hepatitis B (HCC) Low January VL. No issues. Will get cmp to make sure no issues with n/v - Comprehensive metabolic panel  2. Severe dysplasia of cervix (CIN III) Recommend pp pap smear.   3. Supervision of high risk pregnancy, antepartum Routine care. D/w her that she feels like she has a LEEP cervix and I don't think she'll dilate until she's truly in labor and may need to manually break it up if she stops dilating. Nst/afi nv for post dates.  - Comprehensive metabolic panel  4. Viral gastroenteritis - Comprehensive metabolic panel  Term labor symptoms and general obstetric precautions including but not limited to vaginal bleeding, contractions, leaking of fluid and fetal movement were reviewed in detail with the patient. Please refer to After Visit Summary for other counseling recommendations.  Return in about 1 week (around 10/24/2017) for 7-10d rob.   [place] BingPickens, Liyat Faulkenberry, MD

## 2017-10-18 LAB — COMPREHENSIVE METABOLIC PANEL
ALT: 7 IU/L (ref 0–32)
AST: 11 IU/L (ref 0–40)
Albumin/Globulin Ratio: 1.6 (ref 1.2–2.2)
Albumin: 3.8 g/dL (ref 3.5–5.5)
Alkaline Phosphatase: 144 IU/L — ABNORMAL HIGH (ref 39–117)
BILIRUBIN TOTAL: 0.3 mg/dL (ref 0.0–1.2)
BUN/Creatinine Ratio: 9 (ref 9–23)
BUN: 4 mg/dL — AB (ref 6–20)
CALCIUM: 9 mg/dL (ref 8.7–10.2)
CHLORIDE: 105 mmol/L (ref 96–106)
CO2: 19 mmol/L — ABNORMAL LOW (ref 20–29)
Creatinine, Ser: 0.46 mg/dL — ABNORMAL LOW (ref 0.57–1.00)
GFR calc Af Amer: 157 mL/min/{1.73_m2} (ref >59)
GFR calc non Af Amer: 136 mL/min/{1.73_m2} (ref >59)
GLUCOSE: 102 mg/dL — AB (ref 65–99)
Globulin, Total: 2.4 g/dL (ref 1.5–4.5)
POTASSIUM: 3.7 mmol/L (ref 3.5–5.2)
Sodium: 139 mmol/L (ref 134–144)
Total Protein: 6.2 g/dL (ref 6.0–8.5)

## 2017-10-23 ENCOUNTER — Inpatient Hospital Stay (HOSPITAL_COMMUNITY)
Admission: AD | Admit: 2017-10-23 | Discharge: 2017-10-26 | DRG: 806 | Disposition: A | Discharge status: Home / Self Care | Payer: Medicaid Other | Source: Ambulatory Visit | Attending: Obstetrics and Gynecology | Admitting: Obstetrics and Gynecology

## 2017-10-23 ENCOUNTER — Encounter (HOSPITAL_COMMUNITY): Payer: Self-pay

## 2017-10-23 ENCOUNTER — Other Ambulatory Visit: Payer: Self-pay

## 2017-10-23 DIAGNOSIS — O9902 Anemia complicating childbirth: Secondary | ICD-10-CM | POA: Diagnosis present

## 2017-10-23 DIAGNOSIS — Z641 Problems related to multiparity: Secondary | ICD-10-CM

## 2017-10-23 DIAGNOSIS — O9842 Viral hepatitis complicating childbirth: Principal | ICD-10-CM | POA: Diagnosis present

## 2017-10-23 DIAGNOSIS — O3440 Maternal care for other abnormalities of cervix, unspecified trimester: Secondary | ICD-10-CM

## 2017-10-23 DIAGNOSIS — O3443 Maternal care for other abnormalities of cervix, third trimester: Secondary | ICD-10-CM

## 2017-10-23 DIAGNOSIS — Z3A39 39 weeks gestation of pregnancy: Secondary | ICD-10-CM

## 2017-10-23 DIAGNOSIS — O99334 Smoking (tobacco) complicating childbirth: Secondary | ICD-10-CM | POA: Diagnosis present

## 2017-10-23 DIAGNOSIS — O09891 Supervision of other high risk pregnancies, first trimester: Secondary | ICD-10-CM

## 2017-10-23 DIAGNOSIS — D649 Anemia, unspecified: Secondary | ICD-10-CM | POA: Diagnosis present

## 2017-10-23 DIAGNOSIS — Z9889 Other specified postprocedural states: Secondary | ICD-10-CM

## 2017-10-23 DIAGNOSIS — B181 Chronic viral hepatitis B without delta-agent: Secondary | ICD-10-CM | POA: Diagnosis present

## 2017-10-23 DIAGNOSIS — F17210 Nicotine dependence, cigarettes, uncomplicated: Secondary | ICD-10-CM | POA: Diagnosis present

## 2017-10-23 DIAGNOSIS — O0990 Supervision of high risk pregnancy, unspecified, unspecified trimester: Secondary | ICD-10-CM

## 2017-10-23 NOTE — Progress Notes (Signed)
Dr.Degele in a delivery

## 2017-10-23 NOTE — [facility] Note (Signed)
Pt reports contractions since 10am every 7-10 mins. Pt reports some blood show. Denies LOF. Reports good fetal movement. States cervix was 0.5 on last exam.

## 2017-10-23 NOTE — Progress Notes (Signed)
Recheck Pt in one hour for cervical change

## 2017-10-23 NOTE — [facility] Note (Addendum)
Pt. Reports vaginal bleeding for 3 hours. Pt. Reports bleeding after sexual intercourse this afternoon.  Contractions every 10 minutes.  Good fetal movement. Denies leaking of fluid.

## 2017-10-23 NOTE — H&P (Signed)
LABOR AND DELIVERY ADMISSION HISTORY AND PHYSICAL NOTE  Stacey Carrillo is a 27 y.o. female [phone] with IUP at [redacted]w[redacted]d by LMP presenting for SOL. Has been contracting regularly, and contractions getting stronger. Has also having some mild vaginal bleeding. No cervical change in MAU, but has hx of LEEP last year. She reports positive fetal movement. Denies LOF.  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 Abnormal Pap smear   . Anemia   . Group B streptococcal infection   . Hepatitis B   . HPV (human papilloma virus) infection   . Preterm labor    Past Surgical History: Past Surgical History:  Procedure Laterality Date  . DILATION AND CURETTAGE OF UTERUS    . LEEP     Obstetrical History: OB History    Gravida  11   Para  6   Term  6   Preterm  0   AB  4   Living  6     SAB  2   TAB  2   Ectopic  0   Multiple  0   Live Births  6          Social History: Social History   Socioeconomic History  . Marital status: Single    Spouse name: Not on file  . Number of children: Not on file  . Years of education: Not on file  . Highest education level: Not on file  Occupational History  . Not on file  Social Needs  . Financial resource strain: Not on file  . Food insecurity:    Worry: Not on file    Inability: Not on file  . Transportation needs:    Medical: Not on file    Non-medical: Not on file  Tobacco Use  . Smoking status: Current Some Day Smoker    Packs/day: 0.25    Types: Cigarettes  . Smokeless tobacco: Never Used  . Tobacco comment: pt stated she is trying to quite down to 1 ciggarette/day  Substance and Sexual Activity  . Alcohol use: Not Currently    Comment: social  . Drug use: No   . Sexual activity: Yes    Partners: Male    Birth control/protection: None  Lifestyle  . Physical activity:    Days per week: Not on file    Minutes per session: Not on file  . Stress: Not on file  Relationships  . Social connections:    Talks on phone: Not on file    Gets together: Not on file    Attends religious service: Not on file    Active member of club or organization: Not on file    Attends meetings of clubs or organizations: Not on file    Relationship status: Not on file  Other Topics Concern  . Not on file  Social History Narrative  . Not on file   Family History: Family History  Problem Relation Age of Onset  . Hypertension Mother   . Hearing loss Brother   . Asthma Son    Allergies: No Known Allergies  Medications Prior to Admission  Medication Sig Dispense Refill Last Dose  . prenatal vitamin w/FE, FA (PRENATAL 1 + 1) 27-1 MG TABS tablet Take 1 tablet by  mouth daily. 30 each 0 More than a month at Unknown time   Review of Systems  All systems reviewed and negative except as stated in HPI  Physical Exam Blood pressure 118/63, pulse 75, temperature 98.3 F (36.8 C), resp. rate 20, height 5\' 7"  (1.702 m), weight 194 lb (88 kg), last menstrual period 01/24/2017, not currently breastfeeding. General appearance: alert, oriented, uncomfortable for contractions Lungs: normal respiratory effort Heart: regular rate Abdomen: soft, non-tender; gravid, FH appropriate for GA Extremities: No calf swelling or tenderness Presentation: cephalic by bedside US Fetal monitoring: baseline rate 135, moderate variability, +acel, occasional variable Uterine activity: ctx q 3-4 min Dilation: Fingertip Effacement (%): 80 Station: -3 Exam by:: B. Mosca  Prenatal labs: ABO, Rh: A/Positive/-- (09/12 1421) Antibody: Negative (09/12 1421) Rubella: 3.93 (09/12 1421) RPR: Non Reactive (01/03 0830)  HBsAg: Confirm. indicated (09/12 1421)  HIV: Non Reactive (01/03 0830)   GC/Chlamydia: Negative GBS: Negative (02/28 1200)  2-hr GTT: normal (79, 141, 134) Genetic screening:  AFP neg Anatomy US: @ 6991w0d, normal anatomy, female fetus. Bradyarrhythmia noted, f/u fetal ECHO normal. Quant Hep B: 30 IU/ml  Prenatal Transfer Tool  Maternal Diabetes: No Genetic Screening: Normal Maternal Ultrasounds/Referrals: Normal Fetal Ultrasounds or other Referrals:  Fetal echo - normal Maternal Substance Abuse:  No Significant Maternal Medications:  None Significant Maternal Lab Results: Lab values include: Group B Strep negative  Results for orders placed or performed during the hospital encounter of 10/23/17 (from the past 24 hour(s))  CBC   Collection Time: 10/24/17 12:07 AM  Result Value Ref Range   WBC 8.2 4.0 - 10.5 K/uL   RBC 4.74 3.87 - 5.11 MIL/uL   Hemoglobin 11.7 (L) 12.0 - 15.0 g/dL   HCT 09.835.8 (L) 11.936.0 - 14.746.0 %   MCV 75.5 (L) 78.0 - 100.0 fL   MCH 24.7 (L) 26.0 - 34.0 pg   MCHC 32.7 30.0 - 36.0 g/dL   RDW 82.915.0 56.211.5 - 13.015.5 %   Platelets 144 (L) 150 - 400 K/uL  Comprehensive metabolic panel   Collection Time: 10/24/17 12:07 AM  Result Value Ref Range   Sodium 138 135 - 145 mmol/L   Potassium 4.1 3.5 - 5.1 mmol/L   Chloride 109 101 - 111 mmol/L   CO2 20 (L) 22 - 32 mmol/L   Glucose, Bld 92 65 - 99 mg/dL   BUN 5 (L) 6 - 20 mg/dL   Creatinine, Ser 8.650.44 0.44 - 1.00 mg/dL   Calcium 8.9 8.9 - 78.410.3 mg/dL   Total Protein 6.0 (L) 6.5 - 8.1 g/dL   Albumin 2.8 (L) 3.5 - 5.0 g/dL   AST 14 (L) 15 - 41 U/L   ALT 8 (L) 14 - 54 U/L   Alkaline Phosphatase 134 (H) 38 - 126 U/L   Total Bilirubin 0.6 0.3 - 1.2 mg/dL   GFR calc non Af Amer >60 >60 mL/min   GFR calc Af Amer >60 >60 mL/min   Anion gap 9 5 - 15  Type and screen [facility]   Collection Time: 10/24/17 12:07 AM  Result Value Ref Range   ABO/RH(D) A POS    Antibody Screen NEG    Sample Expiration      10/27/2017 Performed at [facility], [address] [place], [place]  [id number]     Assessment: Stacey MoselleFatima Carrillo is a 27 y.o. [id number] at 5963w3d here for SOL.   #Labor: Contracting regularly, cervix 80-90% effaced, but only 0.5 cm with scar tissue  noted. Admitted to L&D and scar tissue broken down manually and gently using a Kelly. SVE now 3-4cm/90%/-2. Also noted to be leaking fluid. #Pain: Epidural  #FWB: Cat II  #ID:  GBS negative #MOF: breast #MOC: partner to get vasectomy #Circ:  Outpatient  Stacey Carrillo 10/23/2017, 11:51 PM

## 2017-10-24 ENCOUNTER — Inpatient Hospital Stay (HOSPITAL_COMMUNITY): Payer: Medicaid Other | Admitting: Anesthesiology

## 2017-10-24 ENCOUNTER — Telehealth: Payer: Self-pay | Admitting: Radiology

## 2017-10-24 ENCOUNTER — Encounter (HOSPITAL_COMMUNITY): Payer: Self-pay | Admitting: *Deleted

## 2017-10-24 ENCOUNTER — Encounter: Payer: Medicaid Other | Admitting: Obstetrics & Gynecology

## 2017-10-24 DIAGNOSIS — F17210 Nicotine dependence, cigarettes, uncomplicated: Secondary | ICD-10-CM | POA: Diagnosis present

## 2017-10-24 DIAGNOSIS — O99334 Smoking (tobacco) complicating childbirth: Secondary | ICD-10-CM | POA: Diagnosis present

## 2017-10-24 DIAGNOSIS — Z3483 Encounter for supervision of other normal pregnancy, third trimester: Secondary | ICD-10-CM | POA: Diagnosis present

## 2017-10-24 DIAGNOSIS — O9902 Anemia complicating childbirth: Secondary | ICD-10-CM | POA: Diagnosis present

## 2017-10-24 DIAGNOSIS — O9842 Viral hepatitis complicating childbirth: Secondary | ICD-10-CM | POA: Diagnosis present

## 2017-10-24 DIAGNOSIS — D649 Anemia, unspecified: Secondary | ICD-10-CM | POA: Diagnosis present

## 2017-10-24 DIAGNOSIS — Z3A39 39 weeks gestation of pregnancy: Secondary | ICD-10-CM | POA: Diagnosis not present

## 2017-10-24 DIAGNOSIS — B181 Chronic viral hepatitis B without delta-agent: Secondary | ICD-10-CM | POA: Diagnosis present

## 2017-10-24 LAB — COMPREHENSIVE METABOLIC PANEL
ALT: 8 U/L — AB (ref 14–54)
ALT: 7 U/L — AB (ref 14–54)
ANION GAP: 9 (ref 5–15)
ANION GAP: 9 (ref 5–15)
AST: 14 U/L — ABNORMAL LOW (ref 15–41)
AST: 13 U/L — ABNORMAL LOW (ref 15–41)
Albumin: 2.8 g/dL — ABNORMAL LOW (ref 3.5–5.0)
Albumin: 2.7 g/dL — ABNORMAL LOW (ref 3.5–5.0)
Alkaline Phosphatase: 134 U/L — ABNORMAL HIGH (ref 38–126)
Alkaline Phosphatase: 123 U/L (ref 38–126)
BUN: 5 mg/dL — ABNORMAL LOW (ref 6–20)
BUN: <5 mg/dL — ABNORMAL LOW (ref 6–20)
CHLORIDE: 109 mmol/L (ref 101–111)
CO2: 20 mmol/L — AB (ref 22–32)
CO2: 20 mmol/L — AB (ref 22–32)
Calcium: 8.9 mg/dL (ref 8.9–10.3)
Calcium: 8.6 mg/dL — ABNORMAL LOW (ref 8.9–10.3)
Chloride: 108 mmol/L (ref 101–111)
Creatinine, Ser: 0.44 mg/dL (ref 0.44–1.00)
Creatinine, Ser: <0.3 mg/dL — ABNORMAL LOW (ref 0.44–1.00)
GFR calc Af Amer: >60 mL/min (ref >60)
GFR calc non Af Amer: >60 mL/min (ref >60)
Glucose, Bld: 92 mg/dL (ref 65–99)
Glucose, Bld: 81 mg/dL (ref 65–99)
POTASSIUM: 4.1 mmol/L (ref 3.5–5.1)
POTASSIUM: 4 mmol/L (ref 3.5–5.1)
SODIUM: 138 mmol/L (ref 135–145)
SODIUM: 137 mmol/L (ref 135–145)
Total Bilirubin: 0.6 mg/dL (ref 0.3–1.2)
Total Bilirubin: 0.4 mg/dL (ref 0.3–1.2)
Total Protein: 6 g/dL — ABNORMAL LOW (ref 6.5–8.1)
Total Protein: 5.7 g/dL — ABNORMAL LOW (ref 6.5–8.1)

## 2017-10-24 LAB — CBC
HCT: 34.3 % — ABNORMAL LOW (ref 36.0–46.0)
HCT: 35.8 % — ABNORMAL LOW (ref 36.0–46.0)
HEMOGLOBIN: 11.7 g/dL — AB (ref 12.0–15.0)
Hemoglobin: 11.4 g/dL — ABNORMAL LOW (ref 12.0–15.0)
MCH: 24.7 pg — AB (ref 26.0–34.0)
MCH: 25 pg — AB (ref 26.0–34.0)
MCHC: 32.7 g/dL (ref 30.0–36.0)
MCHC: 33.2 g/dL (ref 30.0–36.0)
MCV: 75.2 fL — AB (ref 78.0–100.0)
MCV: 75.5 fL — ABNORMAL LOW (ref 78.0–100.0)
PLATELETS: 160 10*3/uL (ref 150–400)
Platelets: 144 10*3/uL — ABNORMAL LOW (ref 150–400)
RBC: 4.56 MIL/uL (ref 3.87–5.11)
RBC: 4.74 MIL/uL (ref 3.87–5.11)
RDW: 15 % (ref 11.5–15.5)
RDW: 15.2 % (ref 11.5–15.5)
WBC: 8.2 10*3/uL (ref 4.0–10.5)
WBC: 9.1 10*3/uL (ref 4.0–10.5)

## 2017-10-24 LAB — TYPE AND SCREEN
ABO/RH(D): A POS
ANTIBODY SCREEN: NEGATIVE

## 2017-10-24 LAB — RPR: RPR: NONREACTIVE

## 2017-10-24 MED ORDER — OXYCODONE-ACETAMINOPHEN 5-325 MG PO TABS: 2.0000 | ORAL_TABLET | ORAL | Status: DC | PRN

## 2017-10-24 MED ORDER — OXYCODONE HCL 5 MG PO TABS
5.0000 mg | ORAL_TABLET | ORAL | Status: DC | PRN
  Administered 2017-10-24 – 2017-10-25 (×4): 5 mg via ORAL
  Filled 2017-10-24 (×4): qty 1

## 2017-10-24 MED ORDER — DIPHENHYDRAMINE HCL 25 MG PO CAPS: 25.0000 mg | ORAL_CAPSULE | Freq: Four times a day (QID) | ORAL | Status: DC | PRN

## 2017-10-24 MED ORDER — LIDOCAINE HCL (PF) 1 % IJ SOLN
INTRAMUSCULAR | Status: DC | PRN
  Administered 2017-10-24 (×2): 4 mL

## 2017-10-24 MED ORDER — OXYTOCIN 40 UNITS IN LACTATED RINGERS INFUSION - SIMPLE MED
INTRAVENOUS | Status: AC
  Administered 2017-10-24: 500 mL via INTRAVENOUS
  Filled 2017-10-24: qty 1000

## 2017-10-24 MED ORDER — OXYTOCIN 40 UNITS IN LACTATED RINGERS INFUSION - SIMPLE MED
2.5000 [IU]/h | INTRAVENOUS | Status: DC
  Administered 2017-10-24: 2.5 [IU]/h via INTRAVENOUS

## 2017-10-24 MED ORDER — EPHEDRINE 5 MG/ML INJ
10.0000 mg | INTRAVENOUS | Status: DC | PRN
  Filled 2017-10-24: qty 2

## 2017-10-24 MED ORDER — ONDANSETRON HCL 4 MG/2ML IJ SOLN: 4.0000 mg | Freq: Four times a day (QID) | INTRAMUSCULAR | Status: DC | PRN

## 2017-10-24 MED ORDER — FENTANYL 2.5 MCG/ML BUPIVACAINE 1/10 % EPIDURAL INFUSION (WH - ANES): 14.0000 mL/h | INTRAMUSCULAR | Status: DC | PRN

## 2017-10-24 MED ORDER — FENTANYL 2.5 MCG/ML BUPIVACAINE 1/10 % EPIDURAL INFUSION (WH - ANES)
14.0000 mL/h | INTRAMUSCULAR | Status: DC | PRN
  Administered 2017-10-24: 14 mL/h via EPIDURAL
  Filled 2017-10-24: qty 100

## 2017-10-24 MED ORDER — MISOPROSTOL 200 MCG PO TABS
ORAL_TABLET | ORAL | Status: AC
  Filled 2017-10-24: qty 4

## 2017-10-24 MED ORDER — DIPHENHYDRAMINE HCL 50 MG/ML IJ SOLN: 12.5000 mg | INTRAMUSCULAR | Status: DC | PRN

## 2017-10-24 MED ORDER — MISOPROSTOL 200 MCG PO TABS
400.0000 ug | ORAL_TABLET | Freq: Once | ORAL | Status: AC
  Administered 2017-10-24: 400 ug via BUCCAL

## 2017-10-24 MED ORDER — BENZOCAINE-MENTHOL 20-0.5 % EX AERO: 1.0000 "application " | INHALATION_SPRAY | CUTANEOUS | Status: DC | PRN

## 2017-10-24 MED ORDER — WITCH HAZEL-GLYCERIN EX PADS: 1.0000 "application " | MEDICATED_PAD | CUTANEOUS | Status: DC | PRN

## 2017-10-24 MED ORDER — SIMETHICONE 80 MG PO CHEW: 80.0000 mg | CHEWABLE_TABLET | ORAL | Status: DC | PRN

## 2017-10-24 MED ORDER — ONDANSETRON HCL 4 MG PO TABS: 4.0000 mg | ORAL_TABLET | ORAL | Status: DC | PRN

## 2017-10-24 MED ORDER — ZOLPIDEM TARTRATE 5 MG PO TABS: 5.0000 mg | ORAL_TABLET | Freq: Every evening | ORAL | Status: DC | PRN

## 2017-10-24 MED ORDER — SOD CITRATE-CITRIC ACID 500-334 MG/5ML PO SOLN: 30.0000 mL | ORAL | Status: DC | PRN

## 2017-10-24 MED ORDER — IBUPROFEN 600 MG PO TABS
600.0000 mg | ORAL_TABLET | Freq: Four times a day (QID) | ORAL | Status: DC
  Administered 2017-10-24 – 2017-10-26 (×8): 600 mg via ORAL
  Filled 2017-10-24 (×8): qty 1

## 2017-10-24 MED ORDER — ACETAMINOPHEN 325 MG PO TABS
650.0000 mg | ORAL_TABLET | ORAL | Status: DC | PRN
  Administered 2017-10-24: 650 mg via ORAL
  Filled 2017-10-24 (×2): qty 2

## 2017-10-24 MED ORDER — LACTATED RINGERS IV SOLN
500.0000 mL | Freq: Once | INTRAVENOUS | Status: AC
  Administered 2017-10-24: 500 mL via INTRAVENOUS

## 2017-10-24 MED ORDER — LACTATED RINGERS IV SOLN: 500.0000 mL | INTRAVENOUS | Status: DC | PRN

## 2017-10-24 MED ORDER — PHENYLEPHRINE 40 MCG/ML (10ML) SYRINGE FOR IV PUSH (FOR BLOOD PRESSURE SUPPORT)
80.0000 ug | PREFILLED_SYRINGE | INTRAVENOUS | Status: DC | PRN
  Filled 2017-10-24: qty 10
  Filled 2017-10-24: qty 5

## 2017-10-24 MED ORDER — DIBUCAINE 1 % RE OINT
1.0000 | TOPICAL_OINTMENT | RECTAL | Status: DC | PRN
Start: 2017-10-24 — End: 2017-10-26

## 2017-10-24 MED ORDER — ACETAMINOPHEN 325 MG PO TABS
650.0000 mg | ORAL_TABLET | ORAL | Status: DC | PRN
  Administered 2017-10-24: 650 mg via ORAL

## 2017-10-24 MED ORDER — PRENATAL MULTIVITAMIN CH
1.0000 | ORAL_TABLET | Freq: Every day | ORAL | Status: DC
  Administered 2017-10-24 – 2017-10-25 (×2): 1 via ORAL
  Filled 2017-10-24 (×2): qty 1

## 2017-10-24 MED ORDER — MISOPROSTOL 200 MCG PO TABS
400.0000 ug | ORAL_TABLET | Freq: Once | ORAL | Status: AC
  Administered 2017-10-24: 400 ug via RECTAL

## 2017-10-24 MED ORDER — SENNOSIDES-DOCUSATE SODIUM 8.6-50 MG PO TABS
2.0000 | ORAL_TABLET | ORAL | Status: DC
  Administered 2017-10-24 – 2017-10-25 (×2): 2 via ORAL
  Filled 2017-10-24 (×2): qty 2

## 2017-10-24 MED ORDER — LIDOCAINE HCL (PF) 1 % IJ SOLN
30.0000 mL | INTRAMUSCULAR | Status: DC | PRN
  Filled 2017-10-24: qty 30

## 2017-10-24 MED ORDER — ONDANSETRON HCL 4 MG/2ML IJ SOLN: 4.0000 mg | INTRAMUSCULAR | Status: DC | PRN

## 2017-10-24 MED ORDER — FLEET ENEMA 7-19 GM/118ML RE ENEM: 1.0000 | ENEMA | RECTAL | Status: DC | PRN

## 2017-10-24 MED ORDER — OXYCODONE-ACETAMINOPHEN 5-325 MG PO TABS
1.0000 | ORAL_TABLET | ORAL | Status: DC | PRN
  Filled 2017-10-24: qty 1

## 2017-10-24 MED ORDER — FENTANYL CITRATE (PF) 100 MCG/2ML IJ SOLN: 100.0000 ug | INTRAMUSCULAR | Status: DC | PRN

## 2017-10-24 MED ORDER — PHENYLEPHRINE 40 MCG/ML (10ML) SYRINGE FOR IV PUSH (FOR BLOOD PRESSURE SUPPORT)
80.0000 ug | PREFILLED_SYRINGE | INTRAVENOUS | Status: DC | PRN
  Filled 2017-10-24: qty 5

## 2017-10-24 MED ORDER — COCONUT OIL OIL
1.0000 | TOPICAL_OIL | Status: DC | PRN
Start: 2017-10-24 — End: 2017-10-26

## 2017-10-24 MED ORDER — OXYTOCIN BOLUS FROM INFUSION
500.0000 mL | Freq: Once | INTRAVENOUS | Status: AC
  Administered 2017-10-24: 500 mL via INTRAVENOUS

## 2017-10-24 MED ORDER — LACTATED RINGERS IV SOLN
INTRAVENOUS | Status: DC
  Administered 2017-10-24 (×2): via INTRAVENOUS

## 2017-10-24 MED ORDER — TETANUS-DIPHTH-ACELL PERTUSSIS 5-2.5-18.5 LF-MCG/0.5 IM SUSP: 0.5000 mL | Freq: Once | INTRAMUSCULAR | Status: DC

## 2017-10-24 NOTE — Anesthesia Postprocedure Evaluation (Signed)
Anesthesia Post Note  Patient: Erie Insurance GroupFatima Carrillo  Procedure(s) Performed: AN AD HOC LABOR EPIDURAL     Patient location during evaluation: Mother Baby Anesthesia Type: Epidural Level of consciousness: awake and alert Pain management: pain level controlled Vital Signs Assessment: post-procedure vital signs reviewed and stable Respiratory status: spontaneous breathing, nonlabored ventilation and respiratory function stable Cardiovascular status: stable Postop Assessment: no headache, no backache and epidural receding Anesthetic complications: no    Last Vitals:  Vitals:   10/24/17 0750 10/24/17 1134  BP: (!) 109/55 (!) 102/49  Pulse: (!) 58 81  Resp:  18  Temp: 37.1 C 37.5 C  SpO2: 100%     Last Pain:  Vitals:   10/24/17 1621  TempSrc:   PainSc: 8    Pain Goal: Patients Stated Pain Goal: 3 (10/24/17 1621)               Junious SilkGILBERT,[name]

## 2017-10-24 NOTE — Progress Notes (Signed)
Parent request formula to supplement breast feeding due to mother request, weak suck in baby. Parents have been informed of small tummy size of newborn, taught hand expression and understands the possible consequences of formula to the health of the infant. The possible consequences shared with patient include 1) Loss of confidence in breastfeeding 2) Engorgement 3) Allergic sensitization of baby(asthma/allergies) and 4) decreased milk supply for mother.After discussion of the above the mother decided to supplement with formula. The tool used to give formula supplement will be bottle with slow flow nipple.

## 2017-10-24 NOTE — Anesthesia Procedure Notes (Signed)
Epidural Patient location during procedure: OB Start time: 10/24/2017 1:20 AM End time: 10/24/2017 1:31 AM  Staffing Anesthesiologist: Lewie LoronGermeroth, Audrick Lamoureaux, MD Performed: anesthesiologist   Preanesthetic Checklist Completed: patient identified, pre-op evaluation, timeout performed, IV checked, risks and benefits discussed and monitors and equipment checked  Epidural Patient position: sitting Prep: site prepped and draped and DuraPrep Patient monitoring: heart rate, continuous pulse ox and blood pressure Approach: midline Location: L3-L4 Injection technique: LOR air and LOR saline  Needle:  Needle type: Tuohy  Needle gauge: 17 G Needle length: 9 cm Needle insertion depth: 6 cm Catheter type: closed end flexible Catheter size: 19 Gauge Catheter at skin depth: 11 cm Test dose: negative  Assessment Sensory level: T8 Events: blood not aspirated, injection not painful, no injection resistance, negative IV test and no paresthesia  Additional Notes Reason for block:procedure for pain

## 2017-10-24 NOTE — Plan of Care (Signed)
Patient is having intensive cramping intermittently. This is her 7th delivery. Tylenol did not offer relief, Ibuprofen helpful but cramping breakthroughs especially when attempting to breastfeed or activity. Order obtained for Oxy IR, helped patient obtain better relief.

## 2017-10-24 NOTE — Anesthesia Preprocedure Evaluation (Signed)
Anesthesia Evaluation  Patient identified by MRN, date of birth, ID band Patient awake    Reviewed: Allergy & Precautions, NPO status , Patient's Chart, lab work & pertinent test results  Airway Mallampati: II  TM Distance: >3 FB Neck ROM: Full    Dental no notable dental hx.    Pulmonary Current Smoker,    Pulmonary exam normal breath sounds clear to auscultation       Cardiovascular negative cardio ROS Normal cardiovascular exam Rhythm:Regular Rate:Normal     Neuro/Psych negative neurological ROS  negative psych ROS   GI/Hepatic negative GI ROS, (+) Hepatitis -, B  Endo/Other  negative endocrine ROS  Renal/GU negative Renal ROS     Musculoskeletal negative musculoskeletal ROS (+)   Abdominal   Peds  Hematology negative hematology ROS (+) anemia ,   Anesthesia Other Findings   Reproductive/Obstetrics (+) Pregnancy                             Anesthesia Physical Anesthesia Plan  ASA: II  Anesthesia Plan: Epidural   Post-op Pain Management:    Induction:   PONV Risk Score and Plan:   Airway Management Planned:   Additional Equipment:   Intra-op Plan:   Post-operative Plan:   Informed Consent: I have reviewed the patients History and Physical, chart, labs and discussed the procedure including the risks, benefits and alternatives for the proposed anesthesia with the patient or authorized representative who has indicated his/her understanding and acceptance.     Plan Discussed with:   Anesthesia Plan Comments:         Anesthesia Quick Evaluation

## 2017-10-24 NOTE — Telephone Encounter (Signed)
Spoke with patient explained that I cancelled for OB appointment and scheduled her postpartum appointment 11/21/17 @ 3:00, patient states understanding.

## 2017-10-25 MED ORDER — IBUPROFEN 600 MG PO TABS: 600.0000 mg | ORAL_TABLET | Freq: Four times a day (QID) | ORAL | 0 refills | Status: DC

## 2017-10-25 NOTE — Lactation Note (Signed)
This note was copied from a baby's chart. Lactation Consultation Note  Patient Name: Stacey [name] Today's Date: 10/25/2017  RN B.McKinnon reported the baby is not eating well.  Baby is 3728 hours old, and has not latched well in life,  LC recommended since the baby is 7228 hours old and still  Not getting with program of feeding to supplement the baby.  The RN reported she has set mom up with DEBP, and will enc to pump 1st.  If no results with supplement with formula.      Maternal Data    Feeding Feeding Type: Breast Fed  LATCH Score                   Interventions    Lactation Tools Discussed/Used     Consult Status      Stacey Carrillo 10/25/2017, 9:17 AM

## 2017-10-25 NOTE — Progress Notes (Signed)
Post Partum Day 1 Subjective: no complaints  Objective: Blood pressure 113/62, pulse 74, temperature 98.9 F (37.2 C), temperature source Oral, resp. rate 14, height 5\' 7"  (1.702 m), weight 194 lb (88 kg), last menstrual period 01/24/2017, SpO2 100 %, unknown if currently breastfeeding.  Physical Exam:  General: alert, cooperative and no distress Lochia: appropriate Uterine Fundus: firm Incision: NA DVT Evaluation: No evidence of DVT seen on physical exam.  Recent Labs    10/24/17 0007 10/24/17 0547  HGB 11.7* 11.4*  HCT 35.8* 34.3*    Assessment/Plan: Plan for discharge tomorrow -Infant staying for feeding issues; patient will be inpatient one more night.   LOS: 1 day   Charlesetta GaribaldiKathryn Lorraine [facility] 10/25/2017, 12:02 PM

## 2017-10-25 NOTE — Progress Notes (Signed)
POSTPARTUM PROGRESS NOTE  Post Partum Day #1 Subjective:  Stacey Carrillo is a 27 y.o. [id number] 515w4d s/p SVD.  No acute events overnight.  Pt denies problems with ambulating, voiding or po intake.  She denies nausea or vomiting.  Pain is moderately controlled and is described as a cramping more than frank pain. States that on pain medication her discomfort is 5/10. She states that her vaginal bleeding is unchanged from yesterday. No clots accompany the vaginal bleeding.  Baby will be circumcised at an outside facility. Plan for birth control is vasectomy by partner. She is still planning to breastfeed, although she expresses some concerns that the baby is not latching properly and is gagging/ spitting up at times after latching.   Objective: Blood pressure 113/62, pulse 74, temperature 98.9 F (37.2 C), temperature source Oral, resp. rate 14, height 5\' 7"  (1.702 m), weight 88 kg (194 lb), last menstrual period 01/24/2017, SpO2 100 %, unknown if currently breastfeeding.  Physical Exam:  General: alert, cooperative and no distress Lochia:normal flow Chest: no respiratory distress Heart:regular rate, distal pulses intact Abdomen: soft, nontender,  Uterine Fundus: firm, appropriately tender DVT Evaluation: No calf swelling or tenderness Extremities: no edema  Recent Labs    10/24/17 0007 10/24/17 0547  HGB 11.7* 11.4*  HCT 35.8* 34.3*    Assessment/Plan:  ASSESSMENT: Stacey MoselleFatima Polan is a 27 y.o. [id number] 4815w4d s/p SVD. She is doing well and is only in mild discomfort currently.   Plan for discharge tomorrow, Breastfeeding, Lactation consult and Contraception vasectomy   LOS: 1 day   [facility] Center[facility]eko Catarina Huntley ,PA-S 10/25/2017, 7:27 AM

## 2017-10-25 NOTE — Discharge Summary (Signed)
OB Discharge Summary     Patient Name: Stacey Carrillo DOB: 11/09/1990 MRN: [id number]  Date of admission: 10/23/2017 Delivering MD: Frederik PearEGELE, JULIE P   Date of discharge: 10/25/2017  Admitting diagnosis: 40 WKS, CTXS, BLEEDING Intrauterine pregnancy: [redacted]w[redacted]d     Secondary diagnosis:  Active Problems:   Chronic hepatitis B (HCC)   Grand multiparity   Short interval between pregnancies affecting pregnancy in first trimester, antepartum   H/O LEEP (loop electrosurgical excision procedure) of cervix complicating pregnancy   Supervision of high risk pregnancy, antepartum   SVD (spontaneous vaginal delivery)   Normal labor  Additional problems: None     Discharge diagnosis: Term Pregnancy Delivered                                                                                                Post partum procedures:None  Augmentation: None  Complications: None  Hospital course:  Onset of Labor With Vaginal Delivery     27 y.o. yo [id number] at 733w4d was admitted in Active Labor on 10/23/2017. Patient had an uncomplicated labor course as follows:  Membrane Rupture Time/Date: 2:20 AM ,10/24/2017   Intrapartum Procedures: Episiotomy: None [1]                                         Lacerations:  None [1]  Patient had a delivery of a Viable infant. 10/24/2017  Information for the patient's newborn:  Candida Peelingbu, Boy Kawena [[id number]][[id number]]  Delivery Method: Vaginal, Spontaneous(Filed from Delivery Summary)    Pateint had an uncomplicated postpartum course.  She is ambulating, tolerating a regular diet, passing flatus, and urinating well. Patient is discharged home in stable condition on 10/25/17.   Physical exam  Vitals:   10/24/17 0640 10/24/17 0750 10/24/17 1134 10/24/17 1925  BP: 115/69 (!) 109/55 (!) 102/49 113/62  Pulse: 64 (!) 58 81 74  Resp: 18  18 14   Temp: 99 F (37.2 C) 98.8 F (37.1 C) 99.5 F (37.5 C) 98.9 F (37.2 C)  TempSrc: Oral Oral Oral Oral  SpO2: 100% 100%  100%  Weight:       Height:       General: alert, cooperative and no distress Lochia: appropriate Uterine Fundus: firm Incision: N/A DVT Evaluation: No evidence of DVT seen on physical exam. No significant calf/ankle edema. Labs: Lab Results  Component Value Date   WBC 9.1 10/24/2017   HGB 11.4 (L) 10/24/2017   HCT 34.3 (L) 10/24/2017   MCV 75.2 (L) 10/24/2017   PLT 160 10/24/2017   CMP Latest Ref Rng & Units 10/24/2017  Glucose 65 - 99 mg/dL 81  BUN 6 - 20 mg/dL <9(G<5(L)  Creatinine 2.950.44 - 1.00 mg/dL <2.84(X<0.30(L)  Sodium 324135 - 145 mmol/L 137  Potassium 3.5 - 5.1 mmol/L 4.0  Chloride 101 - 111 mmol/L 108  CO2 22 - 32 mmol/L 20(L)  Calcium 8.9 - 10.3 mg/dL 4.0(N8.6(L)  Total Protein 6.5 - 8.1 g/dL 0.2(V5.7(L)  Total Bilirubin 0.3 - 1.2 mg/dL 0.4  Alkaline Phos 38 - 126 U/L 123  AST 15 - 41 U/L 13(L)  ALT 14 - 54 U/L 7(L)    Discharge instruction: per After Visit Summary and "Baby and Me Booklet".  After visit meds:  Allergies as of 10/25/2017   No Known Allergies     Medication List    TAKE these medications   ibuprofen 600 MG tablet Commonly known as:  ADVIL,MOTRIN Take 1 tablet (600 mg total) by mouth every 6 (six) hours.   prenatal vitamin w/FE, FA 27-1 MG Tabs tablet Take 1 tablet by mouth daily.       Diet: routine diet  Activity: Advance as tolerated. Pelvic rest for 6 weeks.   Outpatient follow up:4 weeks Follow up Appt: Future Appointments  Date Time Provider Department Center  11/21/2017  3:00 PM Mitchell Bing, MD [facility]   Follow up Visit:No follow-ups on file.  Postpartum contraception: Vasectomy  Newborn Data: Live born female  Birth Weight: 6 lb 10.7 oz (3025 g) APGAR: 8, 9  Newborn Delivery   Birth date/time:  10/24/2017 04:55:00 Delivery type:  Vaginal, Spontaneous     Baby Feeding: Breast Disposition:home with mother  10/25/2017 Caryl Ada, DO

## 2017-10-25 NOTE — Lactation Note (Addendum)
This note was copied from a baby's chart. Lactation Consultation Note Baby 23 hrs old. Baby isn't BF well. Baby gagging frequently while LC in rm. Mom stated baby has been spitting up.  Baby hasn't latched but a couple of times, then poor feeding. Mom gave baby 10 ml Gerber formula. Mom stated she has hand expressed colostrum and spoon fed baby. Encouraged mom to hand express and spoon feed. Baby took colostrum from spoon well. Mom has inverted nipples. W/hand pump pre-pumped to evert nipple. RN gave NS #20 NS fits well. Mom demonstrated application of NS. Mom has shells, not wearing bra,. Encouraged to wear. Baby wouldn't latch but 2 times then only suckled about 1-2 minutes at intervals.   This is mom's 7th child. Mom didn't BF her 28 yr old. BF 69 yr old that she BF for 8 months, 28 yr old BF 1 yr, 28 yr old BF 8 months, 28 yr old BF 4 months, and her 28 yr old BF for 6 months. Mom didn't wear NS w/the first two children she BF. Stated nipple was inverted but pulled out well w/BF. Mom started using NS w/her now 204 yr old (4th child). Mom would only have to use the NS at the beginning after delivery then after a few weeks she didn't.  Reviewed importance of breast stimulation, BF, I&O, supply and demand. Mom encouraged to feed baby 8-12 times/24 hours and with feeding cues. Report to RN if not feeding for next feeding. Asked RN to set up DEBP for stimulation.  Call for assistance or questions.   WH/LC brochure given w/resources, support groups and LC services.   Patient Name: Stacey Carrillo Today's Date: 10/25/2017 Reason for consult: Initial assessment   Maternal Data Has patient been taught Hand Expression?: Yes Does the patient have breastfeeding experience prior to this delivery?: Yes  Feeding Feeding Type: Breast Milk Length of feed: 0 min  LATCH Score Latch: Too sleepy or reluctant, no latch achieved, no sucking elicited.  Audible Swallowing: None  Type of Nipple:  Inverted  Comfort (Breast/Nipple): Filling, red/small blisters or bruises, mild/mod discomfort  Hold (Positioning): Assistance needed to correctly position infant at breast and maintain latch.  LATCH Score: 2  Interventions Interventions: Breast feeding basics reviewed;Support pillows;Assisted with latch;Position options;Skin to skin;Expressed milk;Breast massage;Hand express;Shells;Pre-pump if needed;Hand pump;Breast compression;Adjust position  Lactation Tools Discussed/Used Tools: Shells;Pump;Nipple Shields Nipple shield size: 20 Shell Type: Inverted Breast pump type: Manual WIC Program: Yes   Consult Status Consult Status: Follow-up Date: 10/25/17 Follow-up type: In-patient    [name], [name] 10/25/2017, 4:35 AM

## 2017-10-25 NOTE — Discharge Instructions (Signed)

## 2017-10-26 MED ORDER — OXYCODONE HCL 5 MG PO TABS: 5.0000 mg | ORAL_TABLET | Freq: Three times a day (TID) | ORAL | 0 refills | Status: DC | PRN

## 2017-10-26 NOTE — Progress Notes (Signed)
POSTPARTUM PROGRESS NOTE  Post Partum Day 2 Subjective:  Stacey Carrillo is a 27 y.o. [id number] 8453w4d s/p SVD.  No acute events overnight.  Pt denies problems with ambulating, voiding or po intake.  She experienced mild nausea last night, no vomiting.  Pain is well controlled. Lochia Small. Currently bottle feeding secondary to increased breast tenderness.  Objective: Blood pressure 107/76, pulse 77, temperature 98.2 F (36.8 C), temperature source Oral, resp. rate 18, height 5\' 7"  (1.702 m), weight 88 kg (194 lb), last menstrual period 01/24/2017, SpO2 100 %, unknown if currently breastfeeding.  Physical Exam:  General: alert, cooperative and no distress Lochia:normal flow Chest: no respiratory distress Heart:regular rate, distal pulses intact Abdomen: soft, nontender,  Uterine Fundus: firm, appropriately tender DVT Evaluation: No calf swelling or tenderness Extremities: no edema  Recent Labs    10/24/17 0007 10/24/17 0547  HGB 11.7* 11.4*  HCT 35.8* 34.3*    Assessment/Plan:  ASSESSMENT: Stacey Carrillo is a 27 y.o. [id number] 9753w4d s/p SVD PPD2. Pt is progressing appropriately. She is doing well. Pain is well controlled. Ambulating and transferring without restriction. Infant is bottle feeding without difficulty. Discharge home today.   LOS: 2 days   Anitra LauthAaron A Saif Peter PA-Student 10/26/2017, 7:53 AM

## 2017-10-26 NOTE — Discharge Summary (Signed)
OB Discharge Summary     Patient Name: Stacey MoselleFatima Stark DOB: 07/09/1991 MRN: [id number]  Date of admission: 10/23/2017 Delivering MD: Frederik PearEGELE, Alise Calais P   Date of discharge: 10/26/2017  Admitting diagnosis: 40 WKS, CTXS, BLEEDING Intrauterine pregnancy: 1430w4d     Secondary diagnosis:  Active Problems:   Chronic hepatitis B (HCC)   Grand multiparity   Short interval between pregnancies affecting pregnancy in first trimester, antepartum   H/O LEEP (loop electrosurgical excision procedure) of cervix complicating pregnancy   Supervision of high risk pregnancy, antepartum   SVD (spontaneous vaginal delivery)   Normal labor  Additional problems: None     Discharge diagnosis: Term Pregnancy Delivered                                                                                                Post partum procedures:None  Augmentation: None  Complications: None  Hospital course:  Onset of Labor With Vaginal Delivery     27 y.o. yo [id number] at 6330w4d was admitted in Active Labor on 10/23/2017. Patient had an uncomplicated labor course as follows:  Membrane Rupture Time/Date: 2:20 AM ,10/24/2017   Intrapartum Procedures: Episiotomy: None [1]                                         Lacerations:  None [1]  Patient had a delivery of a Viable infant. 10/24/2017  Information for the patient's newborn:  Candida Peelingbu, Boy Ryley [[id number]][[id number]]  Delivery Method: Vag-Spont   Pateint had an uncomplicated postpartum course.  She is ambulating, tolerating a regular diet, passing flatus, and urinating well. Lactation was consulted to help with breast feeding difficulties. Patient is discharged home in stable condition on 10/26/17.   Physical exam  Vitals:   10/24/17 1134 10/24/17 1925 10/25/17 1830 10/26/17 0538  BP: (!) 102/49 113/62 (!) 112/57 107/76  Pulse: 81 74 69 77  Resp: 18 14  18   Temp: 99.5 F (37.5 C) 98.9 F (37.2 C) 98.6 F (37 C) 98.2 F (36.8 C)  TempSrc: Oral Oral Oral Oral  SpO2:  100%   100%  Weight:      Height:       General: alert, cooperative and no distress Lochia: appropriate Uterine Fundus: firm Incision: N/A DVT Evaluation: No evidence of DVT seen on physical exam. No significant calf/ankle edema. Labs: Lab Results  Component Value Date   WBC 9.1 10/24/2017   HGB 11.4 (L) 10/24/2017   HCT 34.3 (L) 10/24/2017   MCV 75.2 (L) 10/24/2017   PLT 160 10/24/2017   CMP Latest Ref Rng & Units 10/24/2017  Glucose 65 - 99 mg/dL 81  BUN 6 - 20 mg/dL <9(G<5(L)  Creatinine 2.950.44 - 1.00 mg/dL <2.84(X<0.30(L)  Sodium 324135 - 145 mmol/L 137  Potassium 3.5 - 5.1 mmol/L 4.0  Chloride 101 - 111 mmol/L 108  CO2 22 - 32 mmol/L 20(L)  Calcium 8.9 - 10.3 mg/dL 4.0(N8.6(L)  Total Protein 6.5 - 8.1 g/dL 0.2(V5.7(L)  Total Bilirubin 0.3 - 1.2  mg/dL 0.4  Alkaline Phos 38 - 126 U/L 123  AST 15 - 41 U/L 13(L)  ALT 14 - 54 U/L 7(L)    Discharge instruction: per After Visit Summary and "Baby and Me Booklet".  After visit meds:  Allergies as of 10/26/2017   No Known Allergies     Medication List    TAKE these medications   ibuprofen 600 MG tablet Commonly known as:  ADVIL,MOTRIN Take 1 tablet (600 mg total) by mouth every 6 (six) hours.   oxyCODONE 5 MG immediate release tablet Commonly known as:  Oxy IR/ROXICODONE Take 1 tablet (5 mg total) by mouth every 8 (eight) hours as needed for severe pain.   prenatal vitamin w/FE, FA 27-1 MG Tabs tablet Take 1 tablet by mouth daily.       Diet: routine diet  Activity: Advance as tolerated. Pelvic rest for 6 weeks.   Outpatient follow up:4 weeks Follow up Appt: Future Appointments  Date Time Provider Department Center  11/21/2017  3:00 PM Lumpkin Bing, MD [facility]   Postpartum contraception: Vasectomy  Newborn Data: Live born female  Birth Weight: 6 lb 10.7 oz (3025 g) APGAR: 8, 9  Newborn Delivery   Birth date/time:  10/24/2017 04:55:00 Delivery type:  Vaginal, Spontaneous     Baby Feeding:  Breast Disposition:home with mother  10/26/2017 Frederik Pear, MD

## 2017-10-31 ENCOUNTER — Other Ambulatory Visit: Payer: Self-pay

## 2017-10-31 ENCOUNTER — Encounter (HOSPITAL_COMMUNITY): Payer: Self-pay

## 2017-10-31 ENCOUNTER — Telehealth: Payer: Self-pay | Admitting: *Deleted

## 2017-10-31 ENCOUNTER — Inpatient Hospital Stay (HOSPITAL_COMMUNITY): Payer: Medicaid Other

## 2017-10-31 ENCOUNTER — Inpatient Hospital Stay (HOSPITAL_COMMUNITY)
Admission: AD | Admit: 2017-10-31 | Discharge: 2017-11-02 | DRG: 776 | Disposition: A | Discharge status: Home / Self Care | Payer: Medicaid Other | Source: Ambulatory Visit | Attending: Obstetrics and Gynecology | Admitting: Obstetrics and Gynecology

## 2017-10-31 DIAGNOSIS — R001 Bradycardia, unspecified: Secondary | ICD-10-CM | POA: Diagnosis present

## 2017-10-31 DIAGNOSIS — F17210 Nicotine dependence, cigarettes, uncomplicated: Secondary | ICD-10-CM | POA: Diagnosis present

## 2017-10-31 DIAGNOSIS — O1495 Unspecified pre-eclampsia, complicating the puerperium: Secondary | ICD-10-CM | POA: Diagnosis present

## 2017-10-31 DIAGNOSIS — R1013 Epigastric pain: Secondary | ICD-10-CM | POA: Diagnosis present

## 2017-10-31 DIAGNOSIS — O99335 Smoking (tobacco) complicating the puerperium: Secondary | ICD-10-CM | POA: Diagnosis present

## 2017-10-31 DIAGNOSIS — O9943 Diseases of the circulatory system complicating the puerperium: Secondary | ICD-10-CM

## 2017-10-31 DIAGNOSIS — O9989 Other specified diseases and conditions complicating pregnancy, childbirth and the puerperium: Secondary | ICD-10-CM | POA: Diagnosis present

## 2017-10-31 HISTORY — DX: Unspecified pre-eclampsia, complicating the puerperium: O14.95

## 2017-10-31 LAB — COMPREHENSIVE METABOLIC PANEL
ALBUMIN: 3.1 g/dL — AB (ref 3.5–5.0)
ALT: 23 U/L (ref 14–54)
AST: 18 U/L (ref 15–41)
Alkaline Phosphatase: 99 U/L (ref 38–126)
Anion gap: 10 (ref 5–15)
BUN: 12 mg/dL (ref 6–20)
CHLORIDE: 109 mmol/L (ref 101–111)
CO2: 21 mmol/L — AB (ref 22–32)
CREATININE: 0.55 mg/dL (ref 0.44–1.00)
Calcium: 8.7 mg/dL — ABNORMAL LOW (ref 8.9–10.3)
GFR calc Af Amer: >60 mL/min (ref >60)
GLUCOSE: 92 mg/dL (ref 65–99)
POTASSIUM: 4 mmol/L (ref 3.5–5.1)
Sodium: 140 mmol/L (ref 135–145)
Total Bilirubin: 0.3 mg/dL (ref 0.3–1.2)
Total Protein: 6.4 g/dL — ABNORMAL LOW (ref 6.5–8.1)

## 2017-10-31 LAB — CBC
HCT: 32.5 % — ABNORMAL LOW (ref 36.0–46.0)
HCT: 33.8 % — ABNORMAL LOW (ref 36.0–46.0)
Hemoglobin: 10.8 g/dL — ABNORMAL LOW (ref 12.0–15.0)
Hemoglobin: 11.1 g/dL — ABNORMAL LOW (ref 12.0–15.0)
MCH: 24.8 pg — ABNORMAL LOW (ref 26.0–34.0)
MCH: 24.5 pg — ABNORMAL LOW (ref 26.0–34.0)
MCHC: 33.2 g/dL (ref 30.0–36.0)
MCHC: 32.8 g/dL (ref 30.0–36.0)
MCV: 74.7 fL — AB (ref 78.0–100.0)
MCV: 74.6 fL — ABNORMAL LOW (ref 78.0–100.0)
PLATELETS: 195 10*3/uL (ref 150–400)
Platelets: 206 K/uL (ref 150–400)
RBC: 4.35 MIL/uL (ref 3.87–5.11)
RBC: 4.53 MIL/uL (ref 3.87–5.11)
RDW: 15.2 % (ref 11.5–15.5)
RDW: 15.3 % (ref 11.5–15.5)
WBC: 6.4 10*3/uL (ref 4.0–10.5)
WBC: 6.6 K/uL (ref 4.0–10.5)

## 2017-10-31 LAB — URINALYSIS, ROUTINE W REFLEX MICROSCOPIC
Bacteria, UA: NONE SEEN
Bilirubin Urine: NEGATIVE
Glucose, UA: NEGATIVE mg/dL
Ketones, ur: NEGATIVE mg/dL
Nitrite: NEGATIVE
Protein, ur: NEGATIVE mg/dL
Specific Gravity, Urine: 1.01 (ref 1.005–1.030)
pH: 6 (ref 5.0–8.0)

## 2017-10-31 LAB — CREATININE, SERUM
Creatinine, Ser: 0.57 mg/dL (ref 0.44–1.00)
GFR calc Af Amer: >60 mL/min
GFR calc non Af Amer: >60 mL/min

## 2017-10-31 LAB — PROTEIN / CREATININE RATIO, URINE
Creatinine, Urine: 60 mg/dL
Total Protein, Urine: <6 mg/dL

## 2017-10-31 LAB — RAPID URINE DRUG SCREEN, HOSP PERFORMED
Amphetamines: NOT DETECTED
Barbiturates: NOT DETECTED
Benzodiazepines: NOT DETECTED
Cocaine: NOT DETECTED
Opiates: NOT DETECTED
Tetrahydrocannabinol: NOT DETECTED

## 2017-10-31 LAB — BRAIN NATRIURETIC PEPTIDE: B NATRIURETIC PEPTIDE 5: 411.6 pg/mL — AB (ref 0.0–100.0)

## 2017-10-31 LAB — TROPONIN I: Troponin I: <0.03 ng/mL

## 2017-10-31 MED ORDER — MAGNESIUM SULFATE 40 G IN LACTATED RINGERS - SIMPLE
2.0000 g/h | INTRAVENOUS | Status: AC
  Administered 2017-10-31: 2 g/h via INTRAVENOUS
  Filled 2017-10-31: qty 40
  Filled 2017-10-31: qty 500

## 2017-10-31 MED ORDER — LACTATED RINGERS IV SOLN
INTRAVENOUS | Status: DC
  Administered 2017-10-31: 50 mL/h via INTRAVENOUS
  Administered 2017-11-01: 10:00:00 via INTRAVENOUS

## 2017-10-31 MED ORDER — IBUPROFEN 600 MG PO TABS
600.0000 mg | ORAL_TABLET | Freq: Four times a day (QID) | ORAL | Status: DC | PRN
  Administered 2017-11-01 (×2): 600 mg via ORAL
  Filled 2017-10-31 (×2): qty 1

## 2017-10-31 MED ORDER — HYDRALAZINE HCL 20 MG/ML IJ SOLN: 5.0000 mg | INTRAMUSCULAR | Status: DC | PRN

## 2017-10-31 MED ORDER — ENOXAPARIN SODIUM 40 MG/0.4ML ~~LOC~~ SOLN
40.0000 mg | SUBCUTANEOUS | Status: DC
  Administered 2017-11-01: 40 mg via SUBCUTANEOUS
  Filled 2017-10-31 (×2): qty 0.4

## 2017-10-31 MED ORDER — ZOLPIDEM TARTRATE 5 MG PO TABS: 5.0000 mg | ORAL_TABLET | Freq: Every evening | ORAL | Status: DC | PRN

## 2017-10-31 MED ORDER — MAGNESIUM SULFATE BOLUS VIA INFUSION
4.0000 g | Freq: Once | INTRAVENOUS | Status: AC
  Administered 2017-10-31: 4 g via INTRAVENOUS
  Filled 2017-10-31: qty 500

## 2017-10-31 NOTE — Plan of Care (Signed)
Pt. Progressing well. No s/s of PIH. No complaint of pain besides mild lower abd. Cramping, resolved with heat. Sinus brady continues. Pt. On telemetry. Cardiology consult tomorrow. Will monitor.

## 2017-10-31 NOTE — Plan of Care (Signed)
POC discussed with pt, no concerns at this time.   

## 2017-10-31 NOTE — MAU Provider Note (Signed)
History     CSN: [id number]  Arrival date and time: 10/31/17 1140   First Provider Initiated Contact with Patient 10/31/17 1228      Chief Complaint  Patient presents with  . Abdominal Pain  . Chest Pain   HPI Stacey Carrillo is a 27 y.o. [id number] female who presents with chest pain, SOB, epigastric pain. She is 1 weeks s/p SVD.  Symptoms initially began the day after her delivery but she reports that she thought it was "normal" postpartum stuff. Concerned b/c symptoms have worsened and become more constant over the weekend. Chest pain that is constant and describes as substernal chest pressure. Rates pain 6/10. Nothing makes better or worse. Endorses epigastric pain, intermittent headaches, & orthopnea. Denies fever/chills, cough, visual disturbance, leg pain or swelling. Denies history of cardiac issues, illicit drug use, hypertension. Denies hx of complications with previous pregnancies.   Past Medical History:  Diagnosis Date  . Abnormal Pap smear   . Anemia   . Group B streptococcal infection   . Hepatitis B   . HPV (human papilloma virus) infection   . Preterm labor     Past Surgical History:  Procedure Laterality Date  . DILATION AND CURETTAGE OF UTERUS    . LEEP      Family History  Problem Relation Age of Onset  . Hypertension Mother   . Hearing loss Brother   . Asthma Son     Social History   Tobacco Use  . Smoking status: Current Some Day Smoker    Packs/day: 0.25    Types: Cigarettes  . Smokeless tobacco: Never Used  . Tobacco comment: pt stated she is trying to quite down to 1 ciggarette/day  Substance Use Topics  . Alcohol use: Not Currently    Comment: social  . Drug use: No    Allergies: No Known Allergies  Medications Prior to Admission  Medication Sig Dispense Refill Last Dose  . ibuprofen (ADVIL,MOTRIN) 600 MG tablet Take 1 tablet (600 mg total) by mouth every 6 (six) hours. 30 tablet 0   . oxyCODONE (OXY IR/ROXICODONE) 5 MG immediate release  tablet Take 1 tablet (5 mg total) by mouth every 8 (eight) hours as needed for severe pain. 10 tablet 0   . prenatal vitamin w/FE, FA (PRENATAL 1 + 1) 27-1 MG TABS tablet Take 1 tablet by mouth daily. 30 each 0 Past Month at Unknown time    Review of Systems  Constitutional: Negative.   Eyes: Negative for visual disturbance.  Respiratory: Positive for shortness of breath. Negative for cough, chest tightness and wheezing.   Cardiovascular: Positive for chest pain. Negative for palpitations and leg swelling.  Gastrointestinal: Positive for abdominal pain. Negative for nausea and vomiting.  Neurological: Positive for light-headedness and headaches. Negative for syncope.   Physical Exam   Blood pressure (!) 165/85, pulse (!) 40, temperature 98.5 F (36.9 C), temperature source Oral, resp. rate 18, SpO2 99 %, currently breastfeeding. Patient Vitals for the past 24 hrs:  BP Temp Temp src Pulse Resp SpO2  10/31/17 1516 (!) 165/85 - - - - -  10/31/17 1514 - - - - - 99 %  10/31/17 1509 - - - - - 100 %  10/31/17 1504 - - - - - 98 %  10/31/17 1501 (!) 168/85 - - (!) 40 - -  10/31/17 1459 - - - - - 98 %  10/31/17 1454 - - - - - 99 %  10/31/17 1449 - - - Marland Kitchen(!)  45 - 99 %  10/31/17 1446 (!) 159/87 - - (!) 40 - -  10/31/17 1444 - - - - - 100 %  10/31/17 1439 - - - (!) 38 - 99 %  10/31/17 1434 - - - - - 98 %  10/31/17 1431 (!) 148/78 - - (!) 40 - -  10/31/17 1429 - - - - - 99 %  10/31/17 1424 - - - - - 99 %  10/31/17 1419 - - - - - 99 %  10/31/17 1416 (!) 156/84 - - (!) 40 - -  10/31/17 1413 - - - - - 100 %  10/31/17 1409 - - - - - 100 %  10/31/17 1404 - - - - - 100 %  10/31/17 1401 (!) 144/85 - - (!) 42 - -  10/31/17 1359 - - - - - 100 %  10/31/17 1354 - - - - - 100 %  10/31/17 1346 139/77 - - (!) 43 - -  10/31/17 1344 - - - - - 100 %  10/31/17 1338 - - - - - 100 %  10/31/17 1334 - - - - - 100 %  10/31/17 1331 (!) 165/81 - - - - -  10/31/17 1329 - - - - - 100 %  10/31/17 1324 - - - - -  100 %  10/31/17 1319 - - - - - 100 %  10/31/17 1316 (!) 151/77 - - (!) 40 - -  10/31/17 1314 - - - - - 98 %  10/31/17 1309 - - - - - 98 %  10/31/17 1304 - - - - - 98 %  10/31/17 1302 - - - - - 98 %  10/31/17 1301 (!) 153/88 - - (!) 40 - -  10/31/17 1259 - - - - - 100 %  10/31/17 1257 - - - (!) 40 - 100 %  10/31/17 1254 - - - - - 98 %  10/31/17 1250 (!) 155/80 - - (!) 40 - -  10/31/17 1249 - - - - - 100 %  10/31/17 1244 - - - - - 99 %  10/31/17 1235 (!) 155/82 - - (!) 44 - -  10/31/17 1234 - - - (!) 44 - 100 %  10/31/17 1225 - - - - - 99 %  10/31/17 1220 - - - (!) 40 - 100 %  10/31/17 1218 - - - (!) 43 - 100 %  10/31/17 1217 (!) 145/89 - - (!) 36 - 100 %  10/31/17 1216 - - - (!) 44 - 100 %  10/31/17 1215 - - - (!) 53 - 100 %  10/31/17 1206 (!) 160/83 98.5 F (36.9 C) Oral (!) 44 18 -  10/31/17 1203 - - - - - 100 %    Physical Exam  Nursing note and vitals reviewed. Constitutional: She is oriented to person, place, and time. She appears well-developed and well-nourished. No distress.  HENT:  Head: Normocephalic and atraumatic.  Eyes: Conjunctivae are normal. Right eye exhibits no discharge. Left eye exhibits no discharge. No scleral icterus.  Neck: Normal range of motion.  Cardiovascular: Regular rhythm, normal heart sounds and intact distal pulses. Bradycardia present.  No murmur heard. Respiratory: Effort normal and breath sounds normal. No respiratory distress. She has no wheezes.  GI: Soft. Bowel sounds are normal. She exhibits no distension. There is tenderness in the epigastric area. There is no rebound, no guarding and negative Murphy's sign.  Musculoskeletal: She exhibits no edema.  Neurological: She is alert and oriented to person, place, and time. She has normal reflexes.  No clonus  Skin: Skin is warm and dry. She is not diaphoretic.  Psychiatric: She has a normal mood and affect. Her behavior is normal. Judgment and thought content normal.    MAU Course   Procedures Results for orders placed or performed during the hospital encounter of 10/31/17 (from the past 24 hour(s))  CBC     Status: Abnormal   Collection Time: 10/31/17 12:14 PM  Result Value Ref Range   WBC 6.4 4.0 - 10.5 K/uL   RBC 4.35 3.87 - 5.11 MIL/uL   Hemoglobin 10.8 (L) 12.0 - 15.0 g/dL   HCT 16.1 (L) 09.6 - 04.5 %   MCV 74.7 (L) 78.0 - 100.0 fL   MCH 24.8 (L) 26.0 - 34.0 pg   MCHC 33.2 30.0 - 36.0 g/dL   RDW 40.9 81.1 - 91.4 %   Platelets 195 150 - 400 K/uL  Comprehensive metabolic panel     Status: Abnormal   Collection Time: 10/31/17 12:14 PM  Result Value Ref Range   Sodium 140 135 - 145 mmol/L   Potassium 4.0 3.5 - 5.1 mmol/L   Chloride 109 101 - 111 mmol/L   CO2 21 (L) 22 - 32 mmol/L   Glucose, Bld 92 65 - 99 mg/dL   BUN 12 6 - 20 mg/dL   Creatinine, Ser 7.82 0.44 - 1.00 mg/dL   Calcium 8.7 (L) 8.9 - 10.3 mg/dL   Total Protein 6.4 (L) 6.5 - 8.1 g/dL   Albumin 3.1 (L) 3.5 - 5.0 g/dL   AST 18 15 - 41 U/L   ALT 23 14 - 54 U/L   Alkaline Phosphatase 99 38 - 126 U/L   Total Bilirubin 0.3 0.3 - 1.2 mg/dL   GFR calc non Af Amer >60 >60 mL/min   GFR calc Af Amer >60 >60 mL/min   Anion gap 10 5 - 15  Protein / creatinine ratio, urine     Status: None   Collection Time: 10/31/17 12:14 PM  Result Value Ref Range   Creatinine, Urine 60.00 mg/dL   Total Protein, Urine <6 mg/dL   Protein Creatinine Ratio        0.00 - 0.15 mg/mg[Cre]  Rapid urine drug screen (hospital performed)     Status: None   Collection Time: 10/31/17 12:14 PM  Result Value Ref Range   Opiates NONE DETECTED NONE DETECTED   Cocaine NONE DETECTED NONE DETECTED   Benzodiazepines NONE DETECTED NONE DETECTED   Amphetamines NONE DETECTED NONE DETECTED   Tetrahydrocannabinol NONE DETECTED NONE DETECTED   Barbiturates NONE DETECTED NONE DETECTED  Urinalysis, Routine w reflex microscopic     Status: Abnormal   Collection Time: 10/31/17 12:14 PM  Result Value Ref Range   Color, Urine YELLOW  YELLOW   APPearance CLEAR CLEAR   Specific Gravity, Urine 1.010 1.005 - 1.030   pH 6.0 5.0 - 8.0   Glucose, UA NEGATIVE NEGATIVE mg/dL   Hgb urine dipstick LARGE (A) NEGATIVE   Bilirubin Urine NEGATIVE NEGATIVE   Ketones, ur NEGATIVE NEGATIVE mg/dL   Protein, ur NEGATIVE NEGATIVE mg/dL   Nitrite NEGATIVE NEGATIVE   Leukocytes, UA MODERATE (A) NEGATIVE   RBC / HPF 6-30 0 - 5 RBC/hpf   WBC, UA 6-30 0 - 5 WBC/hpf   Bacteria, UA NONE SEEN NONE SEEN   Squamous Epithelial / LPF 0-5 (A) NONE SEEN   Mucus PRESENT  Brain natriuretic peptide     Status: Abnormal   Collection Time: 10/31/17 12:15 PM  Result Value Ref Range   B Natriuretic Peptide 411.6 (H) 0.0 - 100.0 pg/mL  Troponin I     Status: None   Collection Time: 10/31/17 12:49 PM  Result Value Ref Range   Troponin I <0.03 <0.03 ng/mL    MDM Pt with new onset hypertension & bradycardia. EKG, chest xray, preeclampsia labs, troponin, & BNP ordered. Pt in new acute distress & SpO2 100%.  C/w Dr. Alysia Penna while labs still pending. Will continue to monitor & wait for results.   Dr. Alysia Penna at bedside to evaluate patient. Will place admit orders for PP preeclampsia & consult cardiology for bradycardia & elevated BNP.   Assessment and Plan  A: 1. Preeclampsia in postpartum period   2. Postpartum cardiac complication    P: Admit  Mag sulfate for PP PreE Cards consult   Stacey Carrillo 10/31/2017, 12:28 PM

## 2017-10-31 NOTE — Consult Note (Addendum)
Cardiology Consultation:   Patient ID: Stacey Carrillo; [id number]; [birth year]   Admit date: 10/31/2017 Date of Consult: 10/31/2017  Primary Care Provider: Patient, No Pcp Per Primary Cardiologist:New to Dr. Rennis Golden   Patient Profile:   Stacey Carrillo is a 27 y.o. female with no prior cardiac hx  who is being seen today for the evaluation of bradycardia at the request of Dr. Alysia Penna.   Patient had uncomplicated vaginal delivery on 10/24/2017.  This was her seventh pregnancy.  No complication on any pregnancies.  History of Present Illness:   [name] presented with dull achy substernal pressure.  She states she had dull pressure since her admission during pregnancy.  She thought it was due to pregnancy and may be easing off epidural.  She then home however she continued to have pressure.  Exacerbated by climbing on scalp.  She had a few episode of dizziness while standing up.  Normal eating and drinking.  No syncope.  She had orthopnea without PND or lower extremity edema.  She also complains of right-sided and epigastric pain which is separate from her chest pain.  No nausea, vomiting, diarrhea, constipation, fever or chills.  Due to ongoing symptoms see patient ate to [facility] today for further evaluation.  She was noted bradycardic at rate of 40s with elevated blood pressure of 160/83.  No prior history of bradycardia.  Patient is admitted for treatment of preeclampsia.  Started on magnesium sulfate and fluids.   EKG shows sinus bradycardia at rate of 43 bpm, diffuse J-point elevation, biphasic P wave and T wave inversion on lead V1 and V2 only-personally reviewed.  No prior EKG to compare.  Patient denies any family history of premature death or CAD.  No history of syncope as a child.  Denies illicit drug use.  Past Medical History:  Diagnosis Date  . Abnormal Pap smear   . Anemia   . Group B streptococcal infection   . Hepatitis B   . HPV (human papilloma virus) infection   . Preterm labor       Past Surgical History:  Procedure Laterality Date  . DILATION AND CURETTAGE OF UTERUS    . LEEP       Inpatient Medications: Scheduled Meds: . enoxaparin (LOVENOX) injection  40 mg Subcutaneous Q24H   Continuous Infusions: . lactated ringers 50 mL/hr (10/31/17 1611)  . magnesium sulfate 2 g/hr (10/31/17 1645)   PRN Meds: hydrALAZINE, ibuprofen, zolpidem  Allergies:   No Known Allergies  Social History:   Social History   Socioeconomic History  . Marital status: Single    Spouse name: Not on file  . Number of children: Not on file  . Years of education: Not on file  . Highest education level: Not on file  Occupational History  . Not on file  Social Needs  . Financial resource strain: Not on file  . Food insecurity:    Worry: Not on file    Inability: Not on file  . Transportation needs:    Medical: Not on file    Non-medical: Not on file  Tobacco Use  . Smoking status: Current Some Day Smoker    Packs/day: 0.25    Types: Cigarettes  . Smokeless tobacco: Never Used  . Tobacco comment: pt stated she is trying to quite down to 1 ciggarette/day  Substance and Sexual Activity  . Alcohol use: Not Currently    Comment: social  . Drug use: No  . Sexual activity: Yes  Partners: Male    Birth control/protection: None  Lifestyle  . Physical activity:    Days per week: Not on file    Minutes per session: Not on file  . Stress: Not on file  Relationships  . Social connections:    Talks on phone: Not on file    Gets together: Not on file    Attends religious service: Not on file    Active member of club or organization: Not on file    Attends meetings of clubs or organizations: Not on file    Relationship status: Not on file  . Intimate partner violence:    Fear of current or ex partner: Not on file    Emotionally abused: Not on file    Physically abused: Not on file    Forced sexual activity: Not on file  Other Topics Concern  . Not on file  Social  History Narrative  . Not on file    Family History:    Family History  Problem Relation Age of Onset  . Hypertension Mother   . Hearing loss Brother   . Asthma Son      ROS:  Please see the history of present illness.  All other ROS reviewed and negative.     Physical Exam/Data:   Vitals:   10/31/17 1509 10/31/17 1514 10/31/17 1516 10/31/17 1553  BP:   (!) 165/85 (!) 146/82  Pulse:    (!) 41  Resp:    18  Temp:    98.2 F (36.8 C)  TempSrc:    Oral  SpO2: 100% 99%     No intake or output data in the 24 hours ending 10/31/17 1645 There were no vitals filed for this visit. There is no height or weight on file to calculate BMI.  General:  Well nourished, well developed, in no acute distress HEENT: normal Lymph: no adenopathy Neck: no JVD Endocrine:  No thryomegaly Vascular: No carotid bruits; FA pulses 2+ bilaterally without bruits  Cardiac:  normal S1, S2; RRR; no murmur  Lungs:  clear to auscultation bilaterally, no wheezing, rhonchi or rales  Abd: soft, nontender, no hepatomegaly  Ext: no edema Musculoskeletal:  No deformities, BUE and BLE strength normal and equal Skin: warm and dry  Neuro:  CNs 2-12 intact, no focal abnormalities noted Psych:  Normal affect    Relevant CV Studies: Pending echocardiogram  Laboratory Data:  Chemistry Recent Labs  Lab 10/31/17 1214  NA 140  K 4.0  CL 109  CO2 21*  GLUCOSE 92  BUN 12  CREATININE 0.55  CALCIUM 8.7*  GFRNONAA >60  GFRAA >60  ANIONGAP 10    Recent Labs  Lab 10/31/17 1214  PROT 6.4*  ALBUMIN 3.1*  AST 18  ALT 23  ALKPHOS 99  BILITOT 0.3   Hematology Recent Labs  Lab 10/31/17 1214  WBC 6.4  RBC 4.35  HGB 10.8*  HCT 32.5*  MCV 74.7*  MCH 24.8*  MCHC 33.2  RDW 15.2  PLT 195   Cardiac Enzymes Recent Labs  Lab 10/31/17 1249  TROPONINI <0.03   No results for input(s): TROPIPOC in the last 168 hours.  BNP Recent Labs  Lab 10/31/17 1215  BNP 411.6*   Radiology/Studies:  Dg  Chest 2 View  Result Date: 10/31/2017 CLINICAL DATA:  Onset of chest heaviness 1 day postpartum on 10/25/2017, progressively worse, now with shortness of breath, BILATERAL diaphragmatic pain greater on RIGHT, bradycardia, smoker EXAM: CHEST - 2 VIEW COMPARISON:  04/21/2012 FINDINGS: Minimal enlargement of cardiac silhouette which may be related to postpartum state. Mediastinal contours and pulmonary vascularity normal. Lungs clear. No pleural effusion or pneumothorax. Bones unremarkable. IMPRESSION: No acute abnormalities. Electronically Signed   By: Ulyses Southward M.D.   On: 10/31/2017 12:51    Assessment and Plan:   1. Sinus bradycardia No prior history.  She had a dizziness without syncope. Not on any AV blocking agent.  Will monitor her on telemetry.  Pending echocardiogram.  2.  Chest pressure -Atypical for ACS.  No prior history.  Troponin negative.  Urine drug screen clear. EKG with non specific changes. Will cycle troponin.   3. Preeclampsia - treatment per attending. Avoid any BB or CCB.   For questions or updates, please contact [facility] Please consult [email or web address] for contact info under Cardiology/STEMI.   Lorelei Pont, [place]  10/31/2017 4:45 PM

## 2017-10-31 NOTE — H&P (Addendum)
History   CSN: [id number]  Arrival date and time: 10/31/17 1140  First Provider Initiated Contact with Patient 10/31/17 1228     Chief Complaint  Patient presents with  . Abdominal Pain  . Chest Pain   HPI  Stacey Carrillo is a 27 y.o. [id number] female who presents with chest pain, SOB, epigastric pain. She is 1 weeks s/p SVD.  Symptoms initially began the day after her delivery but she reports that she thought it was "normal" postpartum stuff. Concerned b/c symptoms have worsened and become more constant over the weekend. Chest pain that is constant and describes as substernal chest pressure. Rates pain 6/10. Nothing makes better or worse. Endorses epigastric pain, intermittent headaches, & orthopnea. Denies fever/chills, cough, visual disturbance, leg pain or swelling. Denies history of cardiac issues, illicit drug use, hypertension. Denies hx of complications with previous pregnancies.      Past Medical History:  Diagnosis Date  . Abnormal Pap smear   . Anemia   . Group B streptococcal infection   . Hepatitis B   . HPV (human papilloma virus) infection   . Preterm labor         Past Surgical History:  Procedure Laterality Date  . DILATION AND CURETTAGE OF UTERUS    . LEEP          Family History  Problem Relation Age of Onset  . Hypertension Mother   . Hearing loss Brother   . Asthma Son    Social History        Tobacco Use  . Smoking status: Current Some Day Smoker    Packs/day: 0.25    Types: Cigarettes  . Smokeless tobacco: Never Used  . Tobacco comment: pt stated she is trying to quite down to 1 ciggarette/day  Substance Use Topics  . Alcohol use: Not Currently    Comment: social  . Drug use: No   Allergies: No Known Allergies         Medications Prior to Admission  Medication Sig Dispense Refill Last Dose  . ibuprofen (ADVIL,MOTRIN) 600 MG tablet Take 1 tablet (600 mg total) by mouth every 6 (six) hours. 30 tablet 0   . oxyCODONE (OXY IR/ROXICODONE) 5 MG  immediate release tablet Take 1 tablet (5 mg total) by mouth every 8 (eight) hours as needed for severe pain. 10 tablet 0   . prenatal vitamin w/FE, FA (PRENATAL 1 + 1) 27-1 MG TABS tablet Take 1 tablet by mouth daily. 30 each 0 Past Month at Unknown time   Review of Systems  Constitutional: Negative.  Eyes: Negative for visual disturbance.  Respiratory: Positive for shortness of breath. Negative for cough, chest tightness and wheezing.  Cardiovascular: Positive for chest pain. Negative for palpitations and leg swelling.  Gastrointestinal: Positive for abdominal pain. Negative for nausea and vomiting.  Neurological: Positive for light-headedness and headaches. Negative for syncope.   Physical Exam  Blood pressure (!) 165/85, pulse (!) 40, temperature 98.5 F (36.9 C), temperature source Oral, resp. rate 18, SpO2 99 %, currently breastfeeding.  Patient Vitals for the past 24 hrs:   BP Temp Temp src Pulse Resp SpO2  10/31/17 1516 (!) 165/85 - - - - -  10/31/17 1514 - - - - - 99 %  10/31/17 1509 - - - - - 100 %  10/31/17 1504 - - - - - 98 %  10/31/17 1501 (!) 168/85 - - (!) 40 - -  10/31/17 1459 - - - - - 98 %  10/31/17 1454 - - - - - 99 %  10/31/17 1449 - - - (!) 45 - 99 %  10/31/17 1446 (!) 159/87 - - (!) 40 - -  10/31/17 1444 - - - - - 100 %  10/31/17 1439 - - - (!) 38 - 99 %  10/31/17 1434 - - - - - 98 %  10/31/17 1431 (!) 148/78 - - (!) 40 - -  10/31/17 1429 - - - - - 99 %  10/31/17 1424 - - - - - 99 %  10/31/17 1419 - - - - - 99 %  10/31/17 1416 (!) 156/84 - - (!) 40 - -  10/31/17 1413 - - - - - 100 %  10/31/17 1409 - - - - - 100 %  10/31/17 1404 - - - - - 100 %  10/31/17 1401 (!) 144/85 - - (!) 42 - -  10/31/17 1359 - - - - - 100 %  10/31/17 1354 - - - - - 100 %  10/31/17 1346 139/77 - - (!) 43 - -  10/31/17 1344 - - - - - 100 %  10/31/17 1338 - - - - - 100 %  10/31/17 1334 - - - - - 100 %  10/31/17 1331 (!) 165/81 - - - - -  10/31/17 1329 - - - - - 100 %  10/31/17  1324 - - - - - 100 %  10/31/17 1319 - - - - - 100 %  10/31/17 1316 (!) 151/77 - - (!) 40 - -  10/31/17 1314 - - - - - 98 %  10/31/17 1309 - - - - - 98 %  10/31/17 1304 - - - - - 98 %  10/31/17 1302 - - - - - 98 %  10/31/17 1301 (!) 153/88 - - (!) 40 - -  10/31/17 1259 - - - - - 100 %  10/31/17 1257 - - - (!) 40 - 100 %  10/31/17 1254 - - - - - 98 %  10/31/17 1250 (!) 155/80 - - (!) 40 - -  10/31/17 1249 - - - - - 100 %  10/31/17 1244 - - - - - 99 %  10/31/17 1235 (!) 155/82 - - (!) 44 - -  10/31/17 1234 - - - (!) 44 - 100 %  10/31/17 1225 - - - - - 99 %  10/31/17 1220 - - - (!) 40 - 100 %  10/31/17 1218 - - - (!) 43 - 100 %  10/31/17 1217 (!) 145/89 - - (!) 36 - 100 %  10/31/17 1216 - - - (!) 44 - 100 %  10/31/17 1215 - - - (!) 53 - 100 %  10/31/17 1206 (!) 160/83 98.5 F (36.9 C) Oral (!) 44 18 -  10/31/17 1203 - - - - - 100 %   Physical Exam  Nursing note and vitals reviewed.  Constitutional: She is oriented to person, place, and time. She appears well-developed and well-nourished. No distress.  HENT:  Head: Normocephalic and atraumatic.  Eyes: Conjunctivae are normal. Right eye exhibits no discharge. Left eye exhibits no discharge. No scleral icterus.  Neck: Normal range of motion.  Cardiovascular: Regular rhythm, normal heart sounds and intact distal pulses. Bradycardia present.  No murmur heard.  Respiratory: Effort normal and breath sounds normal. No respiratory distress. She has no wheezes.  GI: Soft. Bowel sounds are normal. She exhibits no distension. There is tenderness in the epigastric area.  There is no rebound, no guarding and negative Murphy's sign.  Musculoskeletal: She exhibits no edema.  Neurological: She is alert and oriented to person, place, and time. She has normal reflexes.  No clonus  Skin: Skin is warm and dry. She is not diaphoretic.  Psychiatric: She has a normal mood and affect. Her behavior is normal. Judgment and thought content normal.   MAU  Course  Procedures  Lab Results Last 24 Hours   Results for orders placed or performed during the hospital encounter of 10/31/17 (from the past 24 hour(s))  CBC     Status: Abnormal   Collection Time: 10/31/17 12:14 PM  Result Value Ref Range   WBC 6.4 4.0 - 10.5 K/uL   RBC 4.35 3.87 - 5.11 MIL/uL   Hemoglobin 10.8 (L) 12.0 - 15.0 g/dL   HCT 40.932.5 (L) 81.136.0 - 91.446.0 %   MCV 74.7 (L) 78.0 - 100.0 fL   MCH 24.8 (L) 26.0 - 34.0 pg   MCHC 33.2 30.0 - 36.0 g/dL   RDW 78.215.2 95.611.5 - 21.315.5 %   Platelets 195 150 - 400 K/uL  Comprehensive metabolic panel     Status: Abnormal   Collection Time: 10/31/17 12:14 PM  Result Value Ref Range   Sodium 140 135 - 145 mmol/L   Potassium 4.0 3.5 - 5.1 mmol/L   Chloride 109 101 - 111 mmol/L   CO2 21 (L) 22 - 32 mmol/L   Glucose, Bld 92 65 - 99 mg/dL   BUN 12 6 - 20 mg/dL   Creatinine, Ser 0.860.55 0.44 - 1.00 mg/dL   Calcium 8.7 (L) 8.9 - 10.3 mg/dL   Total Protein 6.4 (L) 6.5 - 8.1 g/dL   Albumin 3.1 (L) 3.5 - 5.0 g/dL   AST 18 15 - 41 U/L   ALT 23 14 - 54 U/L   Alkaline Phosphatase 99 38 - 126 U/L   Total Bilirubin 0.3 0.3 - 1.2 mg/dL   GFR calc non Af Amer >60 >60 mL/min   GFR calc Af Amer >60 >60 mL/min   Anion gap 10 5 - 15  Protein / creatinine ratio, urine     Status: None   Collection Time: 10/31/17 12:14 PM  Result Value Ref Range   Creatinine, Urine 60.00 mg/dL   Total Protein, Urine <6 mg/dL   Protein Creatinine Ratio        0.00 - 0.15 mg/mg[Cre]  Rapid urine drug screen (hospital performed)     Status: None   Collection Time: 10/31/17 12:14 PM  Result Value Ref Range   Opiates NONE DETECTED NONE DETECTED   Cocaine NONE DETECTED NONE DETECTED   Benzodiazepines NONE DETECTED NONE DETECTED   Amphetamines NONE DETECTED NONE DETECTED   Tetrahydrocannabinol NONE DETECTED NONE DETECTED   Barbiturates NONE DETECTED NONE DETECTED  Urinalysis, Routine w reflex microscopic     Status: Abnormal   Collection Time: 10/31/17 12:14 PM  Result Value  Ref Range   Color, Urine YELLOW YELLOW   APPearance CLEAR CLEAR   Specific Gravity, Urine 1.010 1.005 - 1.030   pH 6.0 5.0 - 8.0   Glucose, UA NEGATIVE NEGATIVE mg/dL   Hgb urine dipstick LARGE (A) NEGATIVE   Bilirubin Urine NEGATIVE NEGATIVE   Ketones, ur NEGATIVE NEGATIVE mg/dL   Protein, ur NEGATIVE NEGATIVE mg/dL   Nitrite NEGATIVE NEGATIVE   Leukocytes, UA MODERATE (A) NEGATIVE   RBC / HPF 6-30 0 - 5 RBC/hpf   WBC, UA 6-30 0 - 5 WBC/hpf  Bacteria, UA NONE SEEN NONE SEEN   Squamous Epithelial / LPF 0-5 (A) NONE SEEN   Mucus PRESENT   Brain natriuretic peptide     Status: Abnormal   Collection Time: 10/31/17 12:15 PM  Result Value Ref Range   B Natriuretic Peptide 411.6 (H) 0.0 - 100.0 pg/mL  Troponin I     Status: None   Collection Time: 10/31/17 12:49 PM  Result Value Ref Range   Troponin I <0.03 <0.03 ng/mL   Dg Chest 2 View  Result Date: 10/31/2017 CLINICAL DATA:  Onset of chest heaviness 1 day postpartum on 10/25/2017, progressively worse, now with shortness of breath, BILATERAL diaphragmatic pain greater on RIGHT, bradycardia, smoker EXAM: CHEST - 2 VIEW COMPARISON:  04/21/2012 FINDINGS: Minimal enlargement of cardiac silhouette which may be related to postpartum state. Mediastinal contours and pulmonary vascularity normal. Lungs clear. No pleural effusion or pneumothorax. Bones unremarkable. IMPRESSION: No acute abnormalities. Electronically Signed   By: Ulyses Southward M.D.   On: 10/31/2017 12:51     MDM  Pt with new onset hypertension & bradycardia. EKG, chest xray, preeclampsia labs, troponin, & BNP ordered. Pt in new acute distress & SpO2 100%.  C/w Dr. Alysia Penna while labs still pending. Will continue to monitor & wait for results.  Dr. Alysia Penna at bedside to evaluate patient. Will place admit orders for PP preeclampsia & consult cardiology for bradycardia & elevated BNP.  Assessment and Plan  A:  1. Preeclampsia in postpartum period   2. Postpartum cardiac complication     P: Admit  Mag sulfate for PP PreE  Cards consult  Judeth Horn  10/31/2017, 12:28 PM    OB Attending  Pt seen and examined. Normal TSVD 1 week. SOB and CP since being discharged plus mild HA. Sx worsen today. Present to MAU for evaluation. BP's mildly elevated. Labs no evidence of SPEC. Bradycardia noted. No history of prior cardiac problems. W/U and examine not suggestive for PE or acute cardiac event. Will admit for magnesium therapy x 24 hrs, follow BP, ECHO and cardiology consult for bradycardia. POC reviewed with pt.    Nettie Elm, MD Evern Core

## 2017-10-31 NOTE — Progress Notes (Signed)
[facility] and [facility].  New orders placed.

## 2017-10-31 NOTE — Telephone Encounter (Signed)
Pt called in stating she has had shortness of breath, upper abdominal pain, chest tightness and wanted to be seen today. Explained to pt that we have an MD here in the afternoon and I gelt like she needs to be seen before then. Asked pt to go to MAU for Eval. Pt states she will go now.

## 2017-10-31 NOTE — MAU Note (Signed)
Vag delivery 3/25.  Is having chest pains, center of her chest, feels like there is a lump. Wakes her up, with shortness of breath.  Started the day after delivery. Having upper abd pain and RLQ pain into pelvis.   abd pain started 3 days ago.Hurts to walk.

## 2017-11-01 ENCOUNTER — Inpatient Hospital Stay (HOSPITAL_COMMUNITY): Payer: Medicaid Other

## 2017-11-01 DIAGNOSIS — R001 Bradycardia, unspecified: Secondary | ICD-10-CM

## 2017-11-01 LAB — TSH: TSH: 1.635 u[IU]/mL (ref 0.350–4.500)

## 2017-11-01 LAB — BASIC METABOLIC PANEL
ANION GAP: 10 (ref 5–15)
BUN: 8 mg/dL (ref 6–20)
CALCIUM: 7.6 mg/dL — AB (ref 8.9–10.3)
CHLORIDE: 105 mmol/L (ref 101–111)
CO2: 23 mmol/L (ref 22–32)
Creatinine, Ser: 0.52 mg/dL (ref 0.44–1.00)
GFR calc Af Amer: >60 mL/min (ref >60)
GFR calc non Af Amer: >60 mL/min (ref >60)
GLUCOSE: 91 mg/dL (ref 65–99)
Potassium: 3.6 mmol/L (ref 3.5–5.1)
Sodium: 138 mmol/L (ref 135–145)

## 2017-11-01 LAB — TROPONIN I: Troponin I: <0.03 ng/mL (ref <0.03)

## 2017-11-01 LAB — CORTISOL-AM, BLOOD: CORTISOL - AM: 7.1 ug/dL (ref 6.7–22.6)

## 2017-11-01 LAB — ECHOCARDIOGRAM COMPLETE
HEIGHTINCHES: 67 in
WEIGHTICAEL: 2977.28 [oz_av]

## 2017-11-01 NOTE — Progress Notes (Signed)
DAILY PROGRESS NOTE   Patient Name: Stacey Carrillo Date of Encounter: 11/01/2017  Chief Complaint   No issues overnight, HR improved  Patient Profile   Stacey Carrillo is a 27 y.o. female with no prior cardiac hx  who is being seen today for the evaluation of bradycardia at the request of Dr. Rip Harbour.   Patient had uncomplicated vaginal delivery on 10/24/2017.  This was her seventh pregnancy.  No complication on any pregnancies.  Subjective   HR improved overnight- looks like she had a sinus bradycardia with sinus arrhyhtmia - now HR has improved into the 80's. Feels better - ?related to magnesium, she is on a drip. Echo normal without signs of CHF - no CHF on exam. TSH normal, cortisol normal.  Objective   Vitals:   11/01/17 0532 11/01/17 0600 11/01/17 0700 11/01/17 0835  BP:    107/63  Pulse:    84  Resp:  _0 Temp:    98.4 F (36.9 C)  TempSrc:    Oral  SpO2:    99%  Weight: 186 lb 1.3 oz (84.4 kg)     Height:        Intake/Output Summary (Last 24 hours) at 11/01/2017 1205 Last data filed at 11/01/2017 1114 Gross per 24 hour  Intake 3835 ml  Output 6900 ml  Net -3065 ml   Filed Weights   10/31/17 1736 11/01/17 0532  Weight: 190 lb 4 oz (86.3 kg) 186 lb 1.3 oz (84.4 kg)    Physical Exam   General appearance: alert and no distress Lungs: clear to auscultation bilaterally Heart: regular rate and rhythm, S1, S2 normal, no murmur, click, rub or gallop Extremities: extremities normal, atraumatic, no cyanosis or edema Neurologic: Grossly normal  Inpatient Medications    Scheduled Meds: . enoxaparin (LOVENOX) injection  40 mg Subcutaneous Q24H    Continuous Infusions: . lactated ringers 50 mL/hr at 11/01/17 1100  . magnesium sulfate 2 g/hr (11/01/17 1100)    PRN Meds: hydrALAZINE, ibuprofen, zolpidem   Labs   Results for orders placed or performed during the hospital encounter of 10/31/17 (from the past 48 hour(s))  CBC     Status: Abnormal   Collection  Time: 10/31/17 12:14 PM  Result Value Ref Range   WBC 6.4 4.0 - 10.5 K/uL   RBC 4.35 3.87 - 5.11 MIL/uL   Hemoglobin 10.8 (L) 12.0 - 15.0 g/dL   HCT 32.5 (L) 36.0 - 46.0 %   MCV 74.7 (L) 78.0 - 100.0 fL   MCH 24.8 (L) 26.0 - 34.0 pg   MCHC 33.2 30.0 - 36.0 g/dL   RDW 15.2 11.5 - 15.5 %   Platelets 195 150 - 400 K/uL    Comment: SPECIMEN CHECKED FOR CLOTS REPEATED TO VERIFY Performed at [facility], [address] [place], [place] [address]   Comprehensive metabolic panel     Status: Abnormal   Collection Time: 10/31/17 12:14 PM  Result Value Ref Range   Sodium 140 135 - 145 mmol/L   Potassium 4.0 3.5 - 5.1 mmol/L   Chloride 109 101 - 111 mmol/L   CO2 21 (L) 22 - 32 mmol/L   Glucose, Bld 92 65 - 99 mg/dL   BUN 12 6 - 20 mg/dL   Creatinine, Ser 0.55 0.44 - 1.00 mg/dL   Calcium 8.7 (L) 8.9 - 10.3 mg/dL   Total Protein 6.4 (L) 6.5 - 8.1 g/dL   Albumin 3.1 (L) 3.5 - 5.0 g/dL  AST 18 15 - 41 U/L   ALT 23 14 - 54 U/L   Alkaline Phosphatase 99 38 - 126 U/L   Total Bilirubin 0.3 0.3 - 1.2 mg/dL   GFR calc non Af Amer >60 >60 mL/min   GFR calc Af Amer >60 >60 mL/min    Comment: (NOTE) The eGFR has been calculated using the CKD EPI equation. This calculation has not been validated in all clinical situations. eGFR's persistently <60 mL/min signify possible Chronic Kidney Disease.    Anion gap 10 5 - 15    Comment: Performed at [facility], [address] [place], [place] [address]  Protein / creatinine ratio, urine     Status: None   Collection Time: 10/31/17 12:14 PM  Result Value Ref Range   Creatinine, Urine 60.00 mg/dL   Total Protein, Urine <6 mg/dL    Comment: REPEATED TO VERIFY   Protein Creatinine Ratio        0.00 - 0.15 mg/mg[Cre]    Comment: RESULT BELOW REPORTABLE RANGE, UNABLE TO CALCULATE. Performed at [facility], [address], [place], [place] [address]   Rapid urine drug screen (hospital performed)     Status: None   Collection Time:  10/31/17 12:14 PM  Result Value Ref Range   Opiates NONE DETECTED NONE DETECTED   Cocaine NONE DETECTED NONE DETECTED   Benzodiazepines NONE DETECTED NONE DETECTED   Amphetamines NONE DETECTED NONE DETECTED   Tetrahydrocannabinol NONE DETECTED NONE DETECTED   Barbiturates NONE DETECTED NONE DETECTED    Comment: (NOTE) DRUG SCREEN FOR MEDICAL PURPOSES ONLY.  IF CONFIRMATION IS NEEDED FOR ANY PURPOSE, NOTIFY LAB WITHIN 5 DAYS. LOWEST DETECTABLE LIMITS FOR URINE DRUG SCREEN Drug Class                     Cutoff (ng/mL) Amphetamine and metabolites    1000 Barbiturate and metabolites    200 Benzodiazepine                 782 Tricyclics and metabolites     300 Opiates and metabolites        300 Cocaine and metabolites        300 THC                            50 Performed at [facility], [address], [place], [place] [address]   Urinalysis, Routine w reflex microscopic     Status: Abnormal   Collection Time: 10/31/17 12:14 PM  Result Value Ref Range   Color, Urine YELLOW YELLOW   APPearance CLEAR CLEAR   Specific Gravity, Urine 1.010 1.005 - 1.030   pH 6.0 5.0 - 8.0   Glucose, UA NEGATIVE NEGATIVE mg/dL   Hgb urine dipstick LARGE (A) NEGATIVE   Bilirubin Urine NEGATIVE NEGATIVE   Ketones, ur NEGATIVE NEGATIVE mg/dL   Protein, ur NEGATIVE NEGATIVE mg/dL   Nitrite NEGATIVE NEGATIVE   Leukocytes, UA MODERATE (A) NEGATIVE   RBC / HPF 6-30 0 - 5 RBC/hpf   WBC, UA 6-30 0 - 5 WBC/hpf   Bacteria, UA NONE SEEN NONE SEEN   Squamous Epithelial / LPF 0-5 (A) NONE SEEN   Mucus PRESENT     Comment: Performed at [facility], [address] [place],  [address]  Brain natriuretic peptide     Status: Abnormal   Collection Time: 10/31/17 12:15 PM  Result Value Ref Range   B Natriuretic Peptide  411.6 (H) 0.0 - 100.0 pg/mL    Comment: Performed at [facility], [place] [address] [place], [place] [address]  Troponin I     Status: None   Collection Time: 10/31/17 12:49  PM  Result Value Ref Range   Troponin I <0.03 <0.03 ng/mL    Comment: Performed at [facility], [address] [place], [place] [address]  CBC     Status: Abnormal   Collection Time: 10/31/17  5:42 PM  Result Value Ref Range   WBC 6.6 4.0 - 10.5 K/uL   RBC 4.53 3.87 - 5.11 MIL/uL   Hemoglobin 11.1 (L) 12.0 - 15.0 g/dL   HCT 33.8 (L) 36.0 - 46.0 %   MCV 74.6 (L) 78.0 - 100.0 fL   MCH 24.5 (L) 26.0 - 34.0 pg   MCHC 32.8 30.0 - 36.0 g/dL   RDW 15.3 11.5 - 15.5 %   Platelets 206 150 - 400 K/uL    Comment: Performed at [facility], [address], [place], [place] [address]  Creatinine, serum     Status: None   Collection Time: 10/31/17  5:42 PM  Result Value Ref Range   Creatinine, Ser 0.57 0.44 - 1.00 mg/dL   GFR calc non Af Amer >60 >60 mL/min   GFR calc Af Amer >60 >60 mL/min    Comment: (NOTE) The eGFR has been calculated using the CKD EPI equation. This calculation has not been validated in all clinical situations. eGFR's persistently <60 mL/min signify possible Chronic Kidney Disease. Performed at [facility], [place], [place] [address]   Troponin I (q 6hr x 3)     Status: None   Collection Time: 10/31/17  5:42 PM  Result Value Ref Range   Troponin I <0.03 <0.03 ng/mL    Comment: Performed at [facility], [address], [place], [place] [address]  Troponin I (q 6hr x 3)     Status: None   Collection Time: 10/31/17 11:28 PM  Result Value Ref Range   Troponin I <0.03 <0.03 ng/mL    Comment: Performed at [facility], [address], [place], [place] [address]  Troponin I (q 6hr x 3)     Status: None   Collection Time: 11/01/17  5:21 AM  Result Value Ref Range   Troponin I <0.03 <0.03 ng/mL    Comment: Performed at [facility], [address], [place], [place] [address]  Cortisol-am, blood     Status: None   Collection Time: 11/01/17  5:21 AM  Result Value Ref Range   Cortisol - AM 7.1 6.7 - 22.6 ug/dL    Comment:  Performed at [facility], [place] [address] [place], [place] [address]  TSH     Status: None   Collection Time: 11/01/17  5:21 AM  Result Value Ref Range   TSH 1.635 0.350 - 4.500 uIU/mL    Comment: Performed by a 3rd Generation assay with a functional sensitivity of <=0.01 uIU/mL. Performed at [facility], [address] [place], [place] [address]   Basic metabolic panel     Status: Abnormal   Collection Time: 11/01/17  5:21 AM  Result Value Ref Range   Sodium 138 135 - 145 mmol/L   Potassium 3.6 3.5 - 5.1 mmol/L   Chloride 105 101 - 111 mmol/L   CO2 23 22 - 32 mmol/L   Glucose, Bld 91 65 - 99 mg/dL   BUN 8 6 - 20 mg/dL  Creatinine, Ser 0.52 0.44 - 1.00 mg/dL   Calcium 7.6 (L) 8.9 - 10.3 mg/dL   GFR calc non Af Amer >60 >60 mL/min   GFR calc Af Amer >60 >60 mL/min    Comment: (NOTE) The eGFR has been calculated using the CKD EPI equation. This calculation has not been validated in all clinical situations. eGFR's persistently <60 mL/min signify possible Chronic Kidney Disease.    Anion gap 10 5 - 15    Comment: Performed at [facility], [address] [place], [place] [address]    ECG   N/A  Telemetry   NSR - Personally Reviewed  Radiology    Dg Chest 2 View  Result Date: 10/31/2017 CLINICAL DATA:  Onset of chest heaviness 1 day postpartum on 10/25/2017, progressively worse, now with shortness of breath, BILATERAL diaphragmatic pain greater on RIGHT, bradycardia, smoker EXAM: CHEST - 2 VIEW COMPARISON:  04/21/2012 FINDINGS: Minimal enlargement of cardiac silhouette which may be related to postpartum state. Mediastinal contours and pulmonary vascularity normal. Lungs clear. No pleural effusion or pneumothorax. Bones unremarkable. IMPRESSION: No acute abnormalities. Electronically Signed   By: Lavonia Dana M.D.   On: 10/31/2017 12:51    Cardiac Studies   LV EF: 60% -   65%  ------------------------------------------------------------------- History:    PMH:  Postpartum hypertension. No prior cardiac history.   ------------------------------------------------------------------- Study Conclusions  - Left ventricle: The cavity size was normal. Systolic function was   normal. The estimated ejection fraction was in the range of 60%   to 65%. Wall motion was normal; there were no regional wall   motion abnormalities. Left ventricular diastolic function   parameters were normal. - Aortic valve: Valve area (VTI): 2.17 cm^2. Valve area (Vmax):   2.02 cm^2. Valve area (Vmean): 2.06 cm^2. - Left atrium: The atrium was mildly dilated. - Atrial septum: No defect or patent foramen ovale was identified.  Assessment   Active Problems:   Preeclampsia in postpartum period   Symptomatic sinus bradycardia   Plan   1. Symptomatic sinus bradycardia, etiology unknown. Noted to have sinus arrhyhtmia overnight and HR now in the 80's. Feels better. No signs of CHF or angina. Would avoid AV nodal blocking medications. Advised her to get a HR monitoring watch. Will arrange for follow-up with me in the office in 4-6 weeks.  Cardiology will sign-off. Thanks for the consultation.  Time Spent Directly with Patient:  I have spent a total of 15 minutes with the patient reviewing hospital notes, telemetry, EKGs, labs and examining the patient as well as establishing an assessment and plan that was discussed personally with the patient. > 50% of time was spent in direct patient care.  Length of Stay:  LOS: 1 day   Pixie Casino, MD, [facility], [place] Director of the Advanced Lipid Disorders &  Cardiovascular Risk Reduction Clinic Diplomate of the American Board of Clinical Lipidology Attending Cardiologist  Direct Dial: [phone]  Fax: [phone]  Website:  [email or web address] 11/01/2017, 12:05 PM

## 2017-11-01 NOTE — Progress Notes (Signed)
  Echocardiogram 2D Echocardiogram has been performed.  Stacey SkeenVijay  [name] 11/01/2017, 10:10 AM

## 2017-11-01 NOTE — Progress Notes (Signed)
I spent time with Margaretmary LombardFatima who was having a hard time being separated from her family, particularly her newborn. I provided listening presence as she shared about this unexpected hospitalization and how scary it was to feel so sick.  She also processed the birth of this baby which was also scary. We talked about self-care in the midst of caring for 7 children ranging ages 1111 to newborn and the importance of asking for help when needed.  She was grateful for the support.  Chaplain Dyanne CarrelKaty Eleri Ruben, Bcc Pager, [phone] 4:17 PM

## 2017-11-01 NOTE — Progress Notes (Signed)
Patient ID: Stacey Carrillo, female   DOB: 02/28/1991, 27 y.o.   MRN: [id number] HD # 1 S/P TSVD 1 week, SPEC and bradycardia  Pt is without complaints this morning. Denies HA, blurry vision, SOB or CP. Tolerating diet. Up to restroom without problems.  PE AF  BP 100-120's/60-80's  HR NSR 80's Lungs clear Heart RRR Abd soft + bs U-3 Ext non tender  A/P PPD # 8 TSVD        Postpartum SPEC        Bradycardia  BP improved with magnesium. Will d/c after 24 hrs. W/U by cardiology for bradycardia negative. Appreciate Cardiology's help. Will monitor BP after d/cing magnesium. If remains stable plan d/c home tomorrow. Has f/u with cariology

## 2017-11-01 NOTE — Progress Notes (Signed)
Apnea alarm noted to go off during shift. Immediate attention to pt. Pt. A & O x 4 w/o respiratory complications with each alarm. Leads correctly placed.

## 2017-11-02 ENCOUNTER — Telehealth: Payer: Self-pay | Admitting: Radiology

## 2017-11-02 NOTE — Progress Notes (Signed)
Discharge teaching complete with pt. Pt understood all information and did not have any questions. 

## 2017-11-02 NOTE — Discharge Summary (Signed)
Physician Discharge Summary  Patient ID: Stacey Carrillo MRN: [id number] DOB/AGE: 28/08/1990 28 y.o.  Admit date: 10/31/2017 Discharge date: 11/02/2017  Admission Diagnoses: Postpartum hypertension, Bradycardia  Discharge Diagnoses:  Active Problems:   Preeclampsia in postpartum period   Symptomatic sinus bradycardia   Discharged Condition: good  Hospital Course: Stacey Carrillo presented to [facility] with c/o CP and SOB 1 week after a normal uncomplicated TSVD and post partum course. BP's in MAU elevated. W/U no evidence of HELLP. Asx bradycardia noted. Pt was admitted and started on Magnesium with good response. BP's decreased 100-120's 60-80's. Admission Sx resolved. BP remained the same after magnesium was discontinued. Pt tolerated diet and was ambulating without problems.  Cardiology was consult for evaluation of her bradycardia labs and ECHO were negative. Telemetry demonstrated no arrhythmia. Bradycardia resolved. Cardiology signed off with recommendation of f/u appt in 4-6 weeks  On HD # 2 felt pt was amendable for discharge home. Discharge medications, instructions and follow up reviewed with pt. Pt verbalized understanding.  Consults: cardiology  Significant Diagnostic Studies: labs, radiology: CXR: normal and ECHO  Treatments: magnesium  Discharge Exam: Blood pressure 113/69, pulse 67, temperature 98.5 F (36.9 C), temperature source Oral, resp. rate 16, height 5\' 7"  (1.702 m), weight 85.3 kg (188 lb 0.2 oz), SpO2 100 %, currently breastfeeding.  Lungs clear  Heart RRR Abd soft + BS U-3  Ext non tender  Disposition:    Allergies as of 11/02/2017   No Known Allergies     Medication List    TAKE these medications   ibuprofen 600 MG tablet Commonly known as:  ADVIL,MOTRIN Take 1 tablet (600 mg total) by mouth every 6 (six) hours.   oxyCODONE 5 MG immediate release tablet Commonly known as:  Oxy IR/ROXICODONE Take 1 tablet (5 mg total) by mouth every 8 (eight) hours as needed for  severe pain.   prenatal vitamin w/FE, FA 27-1 MG Tabs tablet Take 1 tablet by mouth daily.      Follow-up Information    Hilty, Lisette AbuKenneth C, MD Follow up.   Specialty:  Cardiology Why:  You have a cardiology hospital follow up appointment on May 8th at 8:15. Please arrive 15 minutes early for check in.  Contact information: [address] [place] [id number] [phone]        [facility]. Schedule an appointment as soon as possible for a visit in 1 week(s).   Specialty:  Obstetrics and Gynecology Why:  1 week for BP check Contact information: [address] [place] [phone]          Signed: Hermina StaggersMichael L Suhey Radford 11/02/2017, 8:50 AM

## 2017-11-02 NOTE — Discharge Instructions (Signed)
Postpartum Hypertension °Postpartum hypertension is high blood pressure after pregnancy that remains higher than normal for more than two days after delivery. You may not realize that you have postpartum hypertension if your blood pressure is not being checked regularly. In some cases, postpartum hypertension will go away on its own, usually within a week of delivery. However, for some women, medical treatment is required to prevent serious complications, such as seizures or stroke. °The following things can affect your blood pressure: °· The type of delivery you had. °· Having received IV fluids or other medicines during or after delivery. ° °What are the causes? °Postpartum hypertension may be caused by any of the following or by a combination of any of the following: °· Hypertension that existed before pregnancy (chronic hypertension). °· Gestational hypertension. °· Preeclampsia or eclampsia. °· Receiving a lot of fluid through an IV during or after delivery. °· Medicines. °· HELLP syndrome. °· Hyperthyroidism. °· Stroke. °· Other rare neurological or blood disorders. ° °In some cases, the cause may not be known. °What increases the risk? °Postpartum hypertension can be related to one or more risk factors, such as: °· Chronic hypertension. In some cases, this may not have been diagnosed before pregnancy. °· Obesity. °· Type 2 diabetes. °· Kidney disease. °· Family history of preeclampsia. °· Other medical conditions that cause hormonal imbalances. ° °What are the signs or symptoms? °As with all types of hypertension, postpartum hypertension may not have any symptoms. Depending on how high your blood pressure is, you may experience: °· Headaches. These may be mild, moderate, or severe. They may also be steady, constant, or sudden in onset (thunderclap headache). °· Visual changes. °· Dizziness. °· Shortness of breath. °· Swelling of your hands, feet, lower legs, or face. In some cases, you may have swelling in  more than one of these locations. °· Heart palpitations or a racing heartbeat. °· Difficulty breathing while lying down. °· Decreased urination. ° °Other rare signs and symptoms may include: °· Sweating more than usual. This lasts longer than a few days after delivery. °· Chest pain. °· Sudden dizziness when you get up from sitting or lying down. °· Seizures. °· Nausea or vomiting. °· Abdominal pain. ° °How is this diagnosed? °The diagnosis of postpartum hypertension is made through a combination of physical examination findings and testing of your blood and urine. You may also have additional tests, such as a CT scan or an MRI, to check for other complications of postpartum hypertension. °How is this treated? °When blood pressure is high enough to require treatment, your options may include: °· Medicines to reduce blood pressure (antihypertensives). Tell your health care provider if you are breastfeeding or if you plan to breastfeed. There are many antihypertensive medicines that are safe to take while breastfeeding. °· Stopping medicines that may be causing hypertension. °· Treating medical conditions that are causing hypertension. °· Treating the complications of hypertension, such as seizures, stroke, or kidney problems. ° °Your health care provider will also continue to monitor your blood pressure closely and repeatedly until it is within a safe range for you. °Follow these instructions at home: °· Take medicines only as directed by your health care provider. °· Get regular exercise after your health care provider tells you that it is safe. °· Follow your health care provider’s recommendations on fluid and salt restrictions. °· Do not use any tobacco products, including cigarettes, chewing tobacco, or electronic cigarettes. If you need help quitting, ask your health care provider. °·   Keep all follow-up visits as directed by your health care provider. This is important. °Contact a health care provider  if: °· Your symptoms get worse. °· You have new symptoms, such as: °? Headache. °? Dizziness. °? Visual changes. °Get help right away if: °· You develop a severe or sudden headache. °· You have seizures. °· You develop numbness or weakness on one side of your body. °· You have difficulty thinking, speaking, or swallowing. °· You develop severe abdominal pain. °· You develop difficulty breathing, chest pain, a racing heartbeat, or heart palpitations. °These symptoms may represent a serious problem that is an emergency. Do not wait to see if the symptoms will go away. Get medical help right away. Call your local emergency services (911 in the [place] Do not drive yourself to the hospital. °This information is not intended to replace advice given to you by your health care provider. Make sure you discuss any questions you have with your health care provider. °Document Released: 03/22/2014 Document Revised: 12/22/2015 Document Reviewed: 01/31/2014 °Elsevier Interactive Patient Education © 2018 [employer] ° °

## 2017-11-02 NOTE — Telephone Encounter (Signed)
Left voicemail on cell phone informing patient of appointment scheduled on 11/09/17 @ 3:30 for BP check. Requested to [facility] if this she needs to change date or time

## 2017-11-21 ENCOUNTER — Ambulatory Visit: Payer: Medicaid Other | Admitting: Obstetrics and Gynecology

## 2017-11-21 ENCOUNTER — Encounter: Payer: Self-pay | Admitting: Obstetrics and Gynecology

## 2017-11-21 NOTE — Progress Notes (Deleted)
Post Partum Exam  [name] is a 27 y.o. [id number] female who presents for a postpartum visit. She is 4 weeks postpartum following a spontaneous vaginal delivery.  The delivery was at 39.3 gestational weeks.  Postpartum course has been complicated by postpartum preeclampsia. Baby is feeding by {breast/bottle:69}.  Bleeding {vag bleed:12292}. Bowel function is {normal:32111}. Bladder function is {normal:32111}.  Patient {is/is not:9024} sexually active.  Contraception method is {contraceptive method:5051}.  Postpartum depression screening:neg

## 2017-11-22 NOTE — Progress Notes (Signed)
Patient did not keep postpartum appointment for 11/21/2017. Will have clinic call patient and/or letter.   Cornelia Copaharlie Shafer Swamy, Jr MD Attending [facility]ulty [facility])

## 2017-11-23 ENCOUNTER — Telehealth: Payer: Self-pay | Admitting: Radiology

## 2017-11-23 NOTE — Telephone Encounter (Signed)
Left message the cell phone voicemail that patient missed postpartum appointment on 11/21/17, to [facility] to reschedule appointment

## 2017-12-01 NOTE — Progress Notes (Signed)
error 

## 2017-12-02 ENCOUNTER — Ambulatory Visit (INDEPENDENT_AMBULATORY_CARE_PROVIDER_SITE_OTHER): Payer: Medicaid Other | Admitting: Obstetrics and Gynecology

## 2017-12-02 ENCOUNTER — Encounter: Payer: Self-pay | Admitting: Obstetrics and Gynecology

## 2017-12-02 ENCOUNTER — Other Ambulatory Visit (HOSPITAL_COMMUNITY)
Admission: RE | Admit: 2017-12-02 | Discharge: 2017-12-02 | Disposition: A | Discharge status: Home / Self Care | Payer: Medicaid Other | Source: Ambulatory Visit | Attending: Obstetrics and Gynecology | Admitting: Obstetrics and Gynecology

## 2017-12-02 VITALS — BP 117/72 | HR 74 | Wt 184.0 lb

## 2017-12-02 DIAGNOSIS — D069 Carcinoma in situ of cervix, unspecified: Secondary | ICD-10-CM

## 2017-12-02 MED ORDER — ETONOGESTREL-ETHINYL ESTRADIOL 0.12-0.015 MG/24HR VA RING: VAGINAL_RING | VAGINAL | 3 refills | Status: DC

## 2017-12-02 NOTE — Progress Notes (Signed)
Obstetrics Visit Postpartum Visit  Appointment Date: 12/02/2017  OBGYN Clinic: [facility]  Primary Care Provider: Patient, No Pcp Per  Chief Complaint:  Chief Complaint  Patient presents with  . Postpartum Care    History of Present Illness: Stacey Carrillo is a 27 y.o. [id number] (No LMP recorded.), seen for the above chief complaint.   She is s/p 3/25 SVD/Intact perineum. She re-presented to [facility] 1wk postpartum and was diagnosed with severe pre-eclampsia and bradycardia. She was discharged home on no meds.   Vaginal bleeding or discharge: No  Breast or formula feeding: breast. q2h Intercourse: No  Contraception after delivery: No  PP depression s/s: No  Any bowel or bladder issues: No   Review of Systems: as noted in the History of Present Illness.  Medications None  Allergies Patient has no known allergies.  Physical Exam:  BP 117/72   Pulse 74   Wt 184 lb (83.5 kg)   BMI 28.82 kg/m  Body mass index is 28.82 kg/m. General appearance: Well nourished, well developed female in no acute distress.  Cardiovascular: normal s1 and s2.  No murmurs, rubs or gallops. Respiratory:  Clear to auscultation bilateral. Normal respiratory effort Abdomen: positive bowel sounds and no masses, hernias; diffusely non tender to palpation, non distended Neuro/Psych:  Normal mood and affect.  Skin:  Warm and dry.  Lymphatic:  No inguinal lymphadenopathy.   Pelvic exam: is not limited by body habitus EGBUS: within normal limits Vagina: within normal limits and with no blood in the vault, Cervix:  no lesions or cervical motion tenderness. Comes off vaginal wall about 1.5cm Uterus:  nonenlarged and approximately 8 week sized Adnexa:  normal adnexa and no mass, fullness, tenderness Rectovaginal: deferred  Laboratory: none  PP Depression Screening:  EPDS 7  Assessment: pt doing well  Plan:  Pap smear done today Husband is getting vasectomy. Pt would like  something in the interim options and r/b/a d/w her and she would like to do nuvaring. Sample one available and placed today.   Patient reminded of cardiology appt next week   RTC PRN  Cornelia Copa MD Attending Center for [facility])

## 2017-12-06 LAB — CYTOLOGY - PAP
Chlamydia: NEGATIVE
Diagnosis: NEGATIVE
HPV: NOT DETECTED
Neisseria Gonorrhea: NEGATIVE
TRICH (WINDOWPATH): NEGATIVE

## 2017-12-07 ENCOUNTER — Ambulatory Visit: Payer: Medicaid Other | Admitting: Internal Medicine

## 2018-03-20 ENCOUNTER — Ambulatory Visit: Payer: Medicaid Other | Admitting: Internal Medicine

## 2018-03-21 ENCOUNTER — Encounter: Payer: Self-pay | Admitting: *Deleted

## 2019-11-07 IMAGING — US US MFM OB LIMITED
1 series · 15 of 24 positions shown · non-contrast
Comparison: none

[Series 1: us mfm ob limited · 15 of 24 slices shown]
[im 1/24]
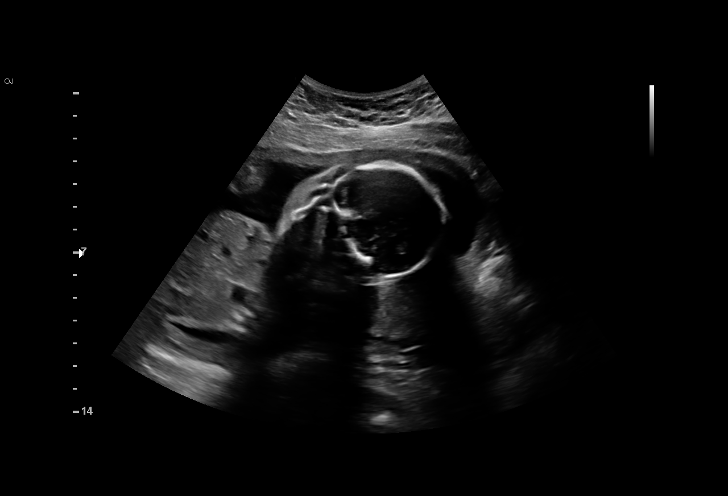
[im 3/24]
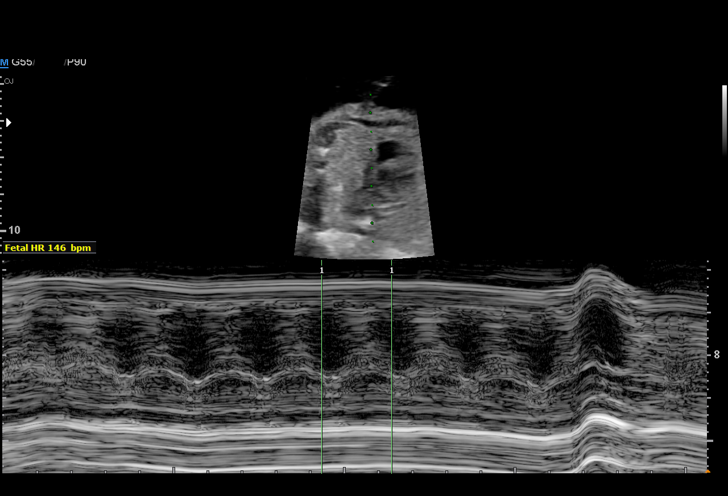
[im 5/24]
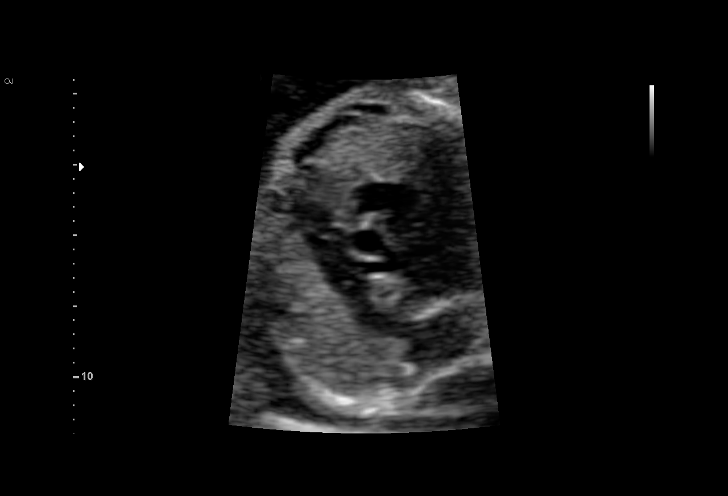
[im 6/24]
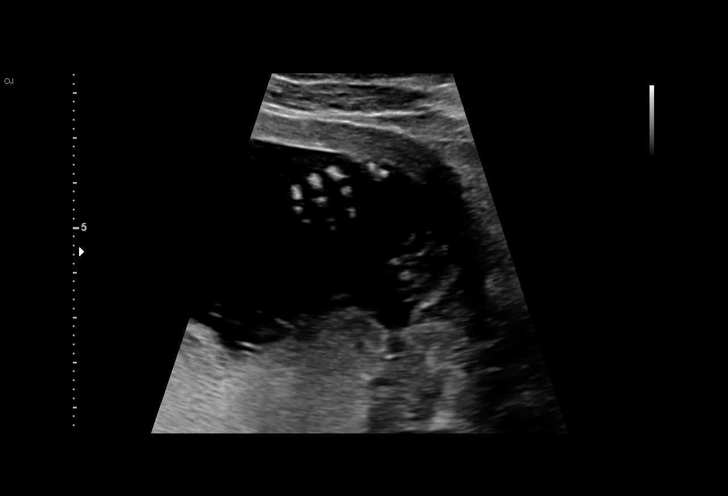
[im 8/24]
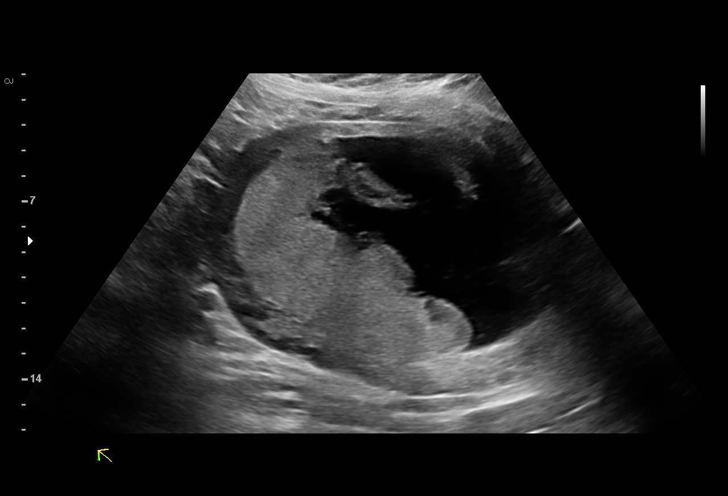
[im 9/24]
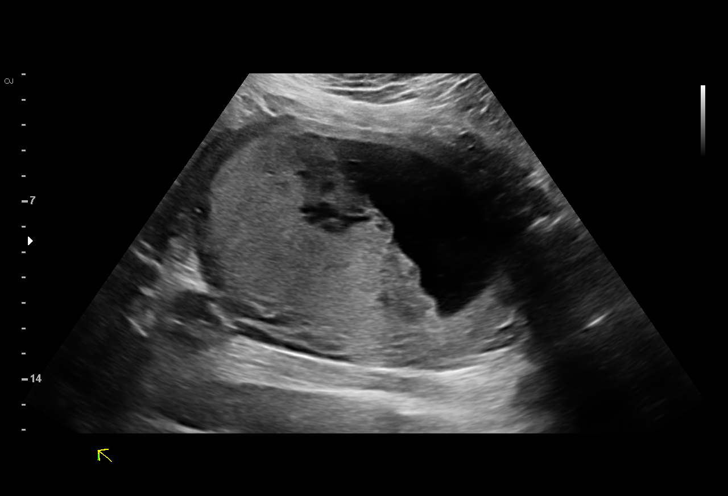
[im 11/24]
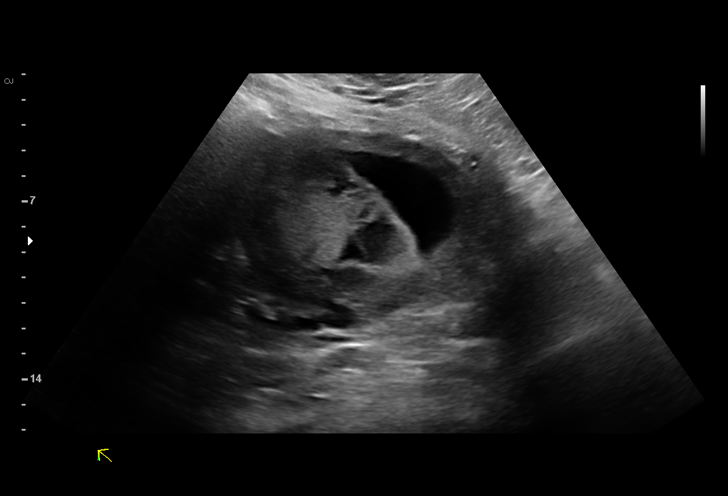
[im 13/24]
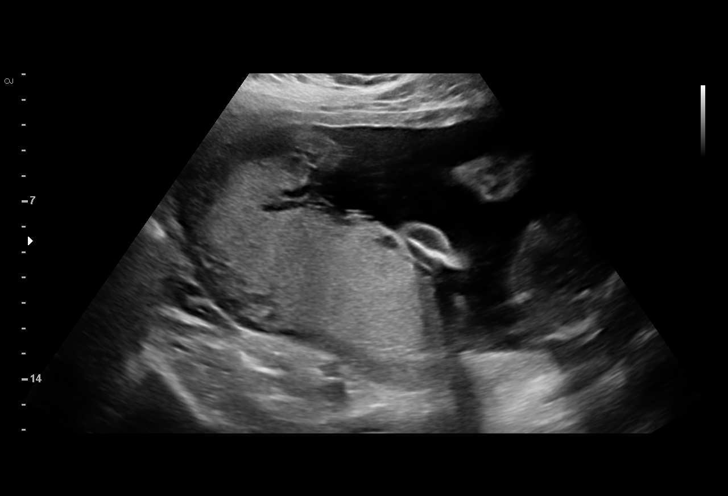
[im 14/24]
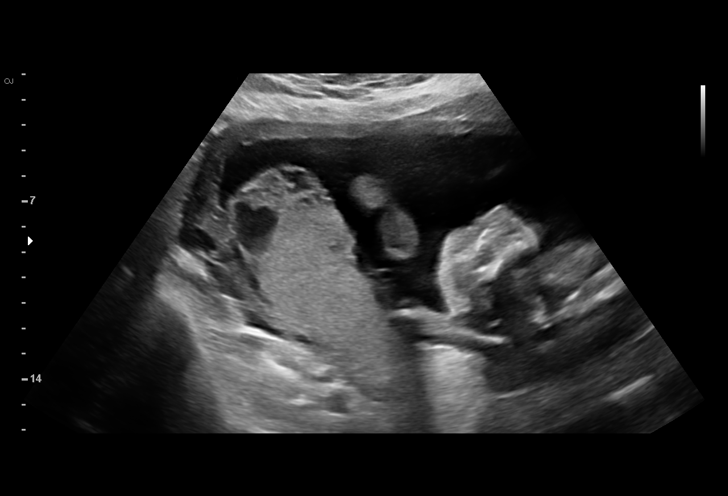
[im 16/24]
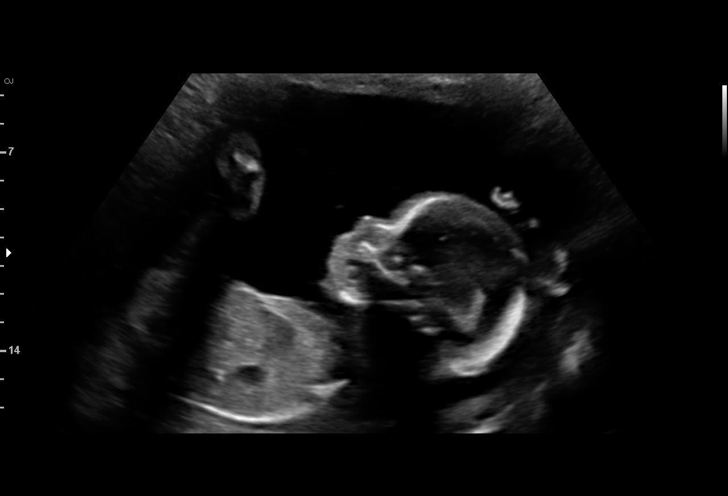
[im 17/24]
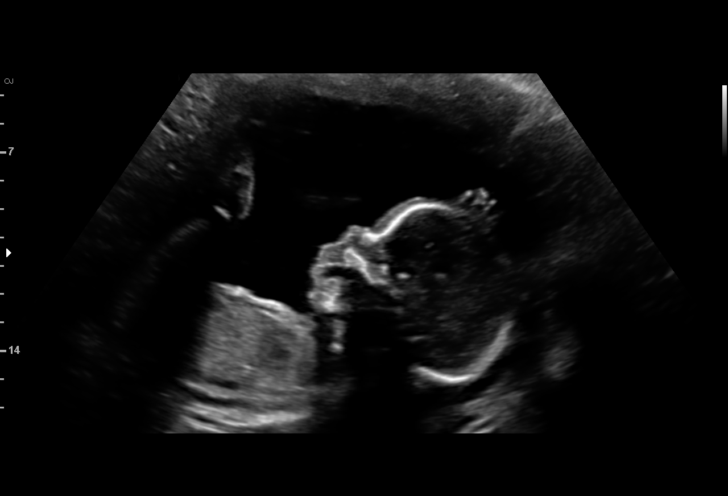
[im 19/24]
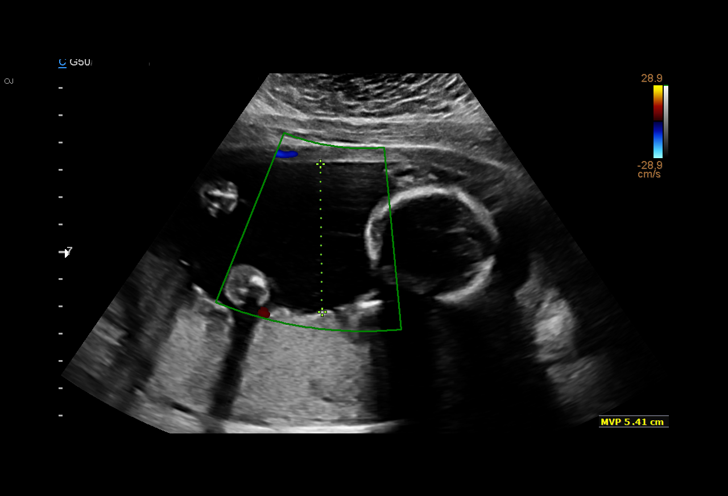
[im 21/24]
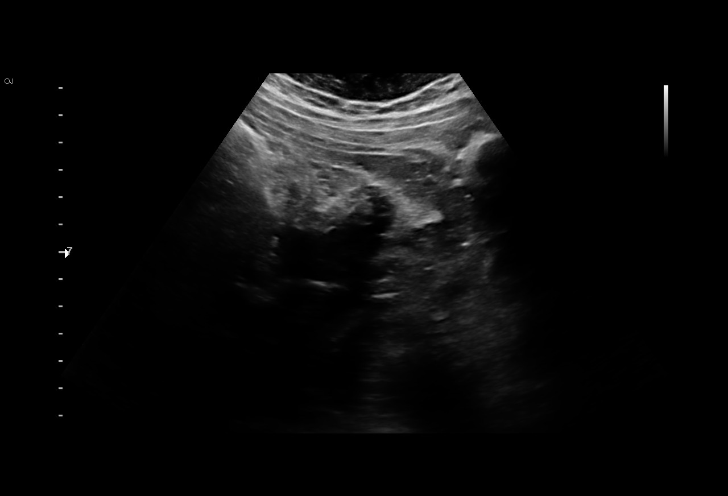
[im 22/24]
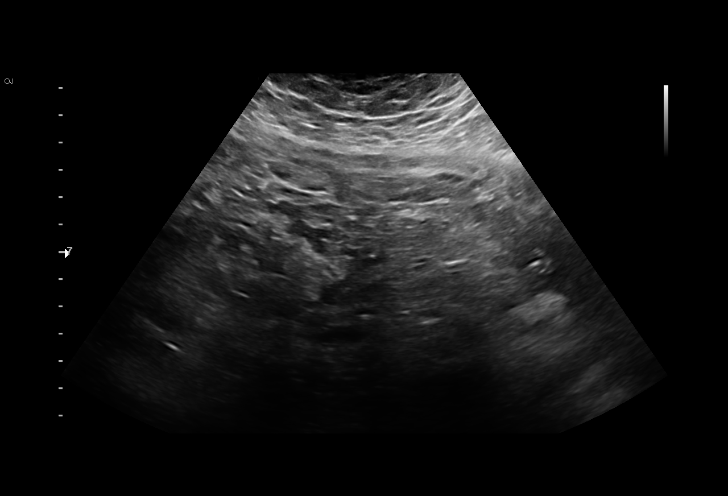
[im 24/24]
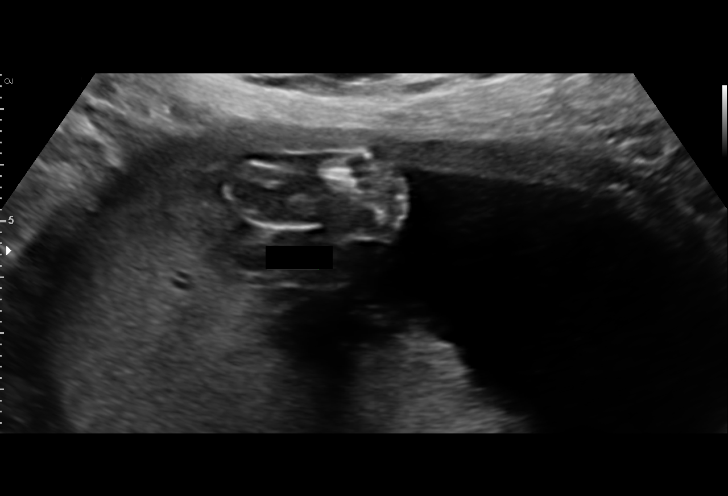

[15 of 24 positions shown; findings below may reference images not displayed]

1  JEVAUN TITTERINGTON            [id number]      [id number]     [id number]
Indications

21 weeks gestation of pregnancy
Fetal arrhythmia affecting pregnancy,
antepartum; normal fetal ECHO; no
arrhythmia
Grand multiparity, antepartum
Obesity complicating pregnancy, second
trimester
OB History

Blood Type:            Height:  5'7"   Weight (lb):  203       BMI:
Gravidity:    11        Term:   6        Prem:   0         SAB:   2
TOP:          2       Ectopic:  0        Living: 6
Fetal Evaluation

Num Of Fetuses:     1
Fetal Heart         146
Rate(bpm):
Cardiac Activity:   Observed
Presentation:       Cephalic

Amniotic Fluid
AFI FV:      Subjectively within normal limits

Largest Pocket(cm)
5.41
Gestational Age

Best:          21w 6d     Det. By:  Early Ultrasound         EDD:    10/27/17
(04/04/17)
Cervix Uterus Adnexa

Cervix
Closed. persistent contraction unable to accurately measure

Uterus
No abnormality visualized.

Left Ovary
Not visualized.

Right Ovary
Not visualized.

Cul De Sac:   No free fluid seen.

Adnexa:       No abnormality visualized.
Impression

SIUP at 21+6 weeks
No arrhythmia identified today
Normal amniotic fluid volume
Recommendations

Follow-up ultrasound for growth in 2 weeks (previously
scheduled)

## 2020-01-02 ENCOUNTER — Ambulatory Visit (HOSPITAL_COMMUNITY)
Admission: EM | Admit: 2020-01-02 | Discharge: 2020-01-02 | Disposition: A | Discharge status: Home / Self Care | Payer: HRSA Program | Attending: Internal Medicine | Admitting: Internal Medicine

## 2020-01-02 ENCOUNTER — Ambulatory Visit (INDEPENDENT_AMBULATORY_CARE_PROVIDER_SITE_OTHER): Payer: HRSA Program

## 2020-01-02 ENCOUNTER — Encounter (HOSPITAL_COMMUNITY): Payer: Self-pay

## 2020-01-02 DIAGNOSIS — Z20822 Contact with and (suspected) exposure to covid-19: Secondary | ICD-10-CM | POA: Insufficient documentation

## 2020-01-02 DIAGNOSIS — B181 Chronic viral hepatitis B without delta-agent: Secondary | ICD-10-CM | POA: Diagnosis not present

## 2020-01-02 DIAGNOSIS — Z87410 Personal history of cervical dysplasia: Secondary | ICD-10-CM | POA: Insufficient documentation

## 2020-01-02 DIAGNOSIS — J209 Acute bronchitis, unspecified: Secondary | ICD-10-CM | POA: Diagnosis not present

## 2020-01-02 DIAGNOSIS — F17210 Nicotine dependence, cigarettes, uncomplicated: Secondary | ICD-10-CM | POA: Diagnosis not present

## 2020-01-02 DIAGNOSIS — Z8759 Personal history of other complications of pregnancy, childbirth and the puerperium: Secondary | ICD-10-CM | POA: Insufficient documentation

## 2020-01-02 MED ORDER — IBUPROFEN 600 MG PO TABS: 600.0000 mg | ORAL_TABLET | Freq: Four times a day (QID) | ORAL | 0 refills | Status: AC | PRN

## 2020-01-02 MED ORDER — BENZONATATE 100 MG PO CAPS: 100.0000 mg | ORAL_CAPSULE | Freq: Three times a day (TID) | ORAL | 0 refills | Status: AC

## 2020-01-02 MED ORDER — MUCINEX 600 MG PO TB12: 600.0000 mg | ORAL_TABLET | Freq: Two times a day (BID) | ORAL | 0 refills | Status: AC

## 2020-01-02 MED ORDER — PREDNISONE 20 MG PO TABS: 20.0000 mg | ORAL_TABLET | Freq: Every day | ORAL | 0 refills | Status: AC

## 2020-01-02 NOTE — ED Triage Notes (Signed)
Pt c/o SOB, HA, body aches, Loss of taste and smell, productive cough w/yellow mucous, dyspnea w/exertion. Pt has non labored breathing. Lungs are clear.

## 2020-01-02 NOTE — ED Provider Notes (Signed)
[place]    CSN: [id number] Arrival date & time: 01/02/20  1859      History   Chief Complaint Chief Complaint  Patient presents with  . COVID symptoms    HPI Stacey Carrillo is a 29 y.o. female comes to the urgent care with 4-day history of cough, chest tightness, chest pain, sore throat and fever.  Symptoms started on Saturday and has been persistent.  Cough is productive of yellowish sputum.  It is associated with shortness of breath on exertion.  Patient has generalized body aches, fever and chills.  She admits to having some diminished sense of taste and smell.  Positive sick contacts at home.  Nobody has been diagnosed with COVID-19.Marland Kitchen   HPI  Past Medical History:  Diagnosis Date  . Abnormal Pap smear   . Anemia   . Group B streptococcal infection   . Hepatitis B   . HPV (human papilloma virus) infection   . Preeclampsia in postpartum period 10/31/2017  . Preterm labor     Patient Active Problem List   Diagnosis Date Noted  . Symptomatic sinus bradycardia 11/01/2017  . H/O LEEP (loop electrosurgical excision procedure) of cervix complicating pregnancy 53/66/4403  . Severe dysplasia of cervix (CIN III) 09/25/2015  . Chronic hepatitis B ([place]) 05/18/2011    Past Surgical History:  Procedure Laterality Date  . DILATION AND CURETTAGE OF UTERUS    . LEEP      OB History    Gravida  11   Para  7   Term  7   Preterm  0   AB  4   Living  7     SAB  2   TAB  2   Ectopic  0   Multiple  0   Live Births  7            Home Medications    Prior to Admission medications   Medication Sig Start Date End Date Taking? Authorizing Provider  etonogestrel-ethinyl estradiol (NUVARING) 0.12-0.015 MG/24HR vaginal ring Insert vaginally and leave in place for 3 consecutive weeks, then remove for 1 week. 12/02/17 01/02/20  Aletha Halim, MD    Family History Family History  Problem Relation Age of Onset  . Hypertension Mother   . Hearing loss  Brother   . Asthma Son     Social History Social History   Tobacco Use  . Smoking status: Current Some Day Smoker    Packs/day: 0.25    Types: Cigarettes  . Smokeless tobacco: Never Used  . Tobacco comment: pt stated she is trying to quite down to 1 ciggarette/day  Substance Use Topics  . Alcohol use: Yes    Comment: social  . Drug use: No     Allergies   Patient has no known allergies.   Review of Systems Review of Systems  Constitutional: Positive for activity change, chills, fatigue and fever.  HENT: Negative.   Respiratory: Positive for cough and shortness of breath. Negative for chest tightness and wheezing.   Cardiovascular: Positive for chest pain. Negative for palpitations.  Gastrointestinal: Negative.   Musculoskeletal: Positive for arthralgias, joint swelling and myalgias.  Skin: Negative for color change and wound.  Neurological: Negative.      Physical Exam Triage Vital Signs ED Triage Vitals  Enc Vitals Group     BP 01/02/20 1925 120/79     Pulse Rate 01/02/20 1925 92     Resp 01/02/20 1925 18     Temp  01/02/20 1925 98.3 F (36.8 C)     Temp Source 01/02/20 1925 Oral     SpO2 01/02/20 1925 98 %     Weight 01/02/20 1926 210 lb (95.3 kg)     Height 01/02/20 1926 5\' 7"  (1.702 m)     Head Circumference --      Peak Flow --      Pain Score 01/02/20 1926 6     Pain Loc --      Pain Edu? --      Excl. in GC? --    No data found.  Updated Vital Signs BP 120/79   Pulse 92   Temp 98.3 F (36.8 C) (Oral)   Resp 18   Ht 5\' 7"  (1.702 m)   Wt 95.3 kg   SpO2 98%   BMI 32.89 kg/m   Visual Acuity Right Eye Distance:   Left Eye Distance:   Bilateral Distance:    Right Eye Near:   Left Eye Near:    Bilateral Near:     Physical Exam Vitals and nursing note reviewed.  Constitutional:      General: She is not in acute distress.    Appearance: She is not ill-appearing.  HENT:     Right Ear: Tympanic membrane normal.     Left Ear: Tympanic  membrane normal.     Mouth/Throat:     Pharynx: No oropharyngeal exudate or posterior oropharyngeal erythema.  Cardiovascular:     Rate and Rhythm: Normal rate and regular rhythm.     Pulses: Normal pulses.  Pulmonary:     Effort: Pulmonary effort is normal.     Breath sounds: Wheezing and rhonchi present.  Musculoskeletal:     Cervical back: Normal range of motion and neck supple. No rigidity or tenderness.  Lymphadenopathy:     Cervical: No cervical adenopathy.  Skin:    Capillary Refill: Capillary refill takes less than 2 seconds.  Neurological:     General: No focal deficit present.     Mental Status: She is alert and oriented to person, place, and time.      UC Treatments / Results  Labs (all labs ordered are listed, but only abnormal results are displayed) Labs Reviewed  SARS CORONAVIRUS 2 (TAT 6-24 HRS)    EKG   Radiology No results found.  Procedures Procedures (including critical care time)  Medications Ordered in UC Medications - No data to display  Initial Impression / Assessment and Plan / UC Course  I have reviewed the triage vital signs and the nursing notes.  Pertinent labs & imaging results that were available during my care of the patient were reviewed by me and considered in my medical decision making (see chart for details).     1.  Acute bronchitis with bronchospasm: Humibid 600 mg twice daily Prednisone 20 mg orally daily for 5 days Tessalon Perles 1 tablet every 8 hours as needed for cough Ibuprofen 600 mg every 6 hours as needed for pain COVID-19 PCR test done Chest x-ray showed perihilar infiltrates consistent with acute bronchitis Return precautions given If patient symptoms worsens she is advised to return to urgent care to be reevaluated Final Clinical Impressions(s) / UC Diagnoses   Final diagnoses:  None   Discharge Instructions   None    ED Prescriptions    None     PDMP not reviewed this encounter.   03/03/20, MD 01/02/20 2024

## 2020-01-03 LAB — SARS CORONAVIRUS 2 (TAT 6-24 HRS): SARS Coronavirus 2: NEGATIVE

## 2020-06-04 ENCOUNTER — Other Ambulatory Visit: Payer: Self-pay

## 2020-06-05 ENCOUNTER — Other Ambulatory Visit: Payer: Self-pay

## 2020-06-05 DIAGNOSIS — Z20822 Contact with and (suspected) exposure to covid-19: Secondary | ICD-10-CM

## 2020-06-06 LAB — NOVEL CORONAVIRUS, NAA: SARS-CoV-2, NAA: DETECTED — AB

## 2020-06-06 LAB — SARS-COV-2, NAA 2 DAY TAT

## 2020-06-07 ENCOUNTER — Telehealth: Payer: Self-pay | Admitting: Physician Assistant

## 2020-06-07 ENCOUNTER — Encounter: Payer: Self-pay | Admitting: Physician Assistant

## 2020-06-07 DIAGNOSIS — E669 Obesity, unspecified: Secondary | ICD-10-CM | POA: Insufficient documentation

## 2020-06-07 NOTE — Telephone Encounter (Signed)
Called to discuss with [facility] about Covid symptoms and the use of sotrovimab, casirivimab/imdevimab or bamlanivimab/etesevimab infusion for those with mild to moderate Covid symptoms and at a high risk of hospitalization.     Pt is qualified for this infusion at the monoclonal antibody infusion center due to co-morbid conditions and/or a member of an at-risk group (BMI>25, high SVI), however would like to think more about the infusion at this time. Symptoms tier reviewed as well as criteria for ending isolation.  Symptoms reviewed that would warrant ED/Hospital evaluation. Preventative practices reviewed. Patient verbalized understanding. Patient advised to call back if he decides that he does want to get infusion. Callback number to the infusion center given. Patient advised to go to Urgent care or ED with severe symptoms. Last date pt would be eligible for infusion is 1/12.     Patient Active Problem List   Diagnosis Date Noted  . Obesity   . Symptomatic sinus bradycardia 11/01/2017  . H/O LEEP (loop electrosurgical excision procedure) of cervix complicating pregnancy 04/10/2017  . Severe dysplasia of cervix (CIN III) 09/25/2015  . Chronic hepatitis B ([facility]) 05/18/2011    Cline Crock PA-C

## 2020-06-19 ENCOUNTER — Other Ambulatory Visit: Payer: Self-pay

## 2020-10-02 ENCOUNTER — Other Ambulatory Visit: Payer: Self-pay

## 2020-10-02 ENCOUNTER — Ambulatory Visit (HOSPITAL_COMMUNITY)
Admission: EM | Admit: 2020-10-02 | Discharge: 2020-10-02 | Disposition: A | Discharge status: Home / Self Care | Payer: Self-pay

## 2020-10-02 ENCOUNTER — Encounter (HOSPITAL_COMMUNITY): Payer: Self-pay | Admitting: Emergency Medicine

## 2020-10-02 ENCOUNTER — Encounter (HOSPITAL_BASED_OUTPATIENT_CLINIC_OR_DEPARTMENT_OTHER): Payer: Self-pay | Admitting: *Deleted

## 2020-10-02 ENCOUNTER — Emergency Department (HOSPITAL_BASED_OUTPATIENT_CLINIC_OR_DEPARTMENT_OTHER)
Admission: EM | Admit: 2020-10-02 | Discharge: 2020-10-02 | Disposition: A | Discharge status: Home / Self Care | Payer: Self-pay | Attending: Emergency Medicine | Admitting: Emergency Medicine

## 2020-10-02 DIAGNOSIS — Z20822 Contact with and (suspected) exposure to covid-19: Secondary | ICD-10-CM | POA: Insufficient documentation

## 2020-10-02 DIAGNOSIS — N939 Abnormal uterine and vaginal bleeding, unspecified: Secondary | ICD-10-CM | POA: Insufficient documentation

## 2020-10-02 DIAGNOSIS — R197 Diarrhea, unspecified: Secondary | ICD-10-CM | POA: Insufficient documentation

## 2020-10-02 DIAGNOSIS — K920 Hematemesis: Secondary | ICD-10-CM | POA: Insufficient documentation

## 2020-10-02 DIAGNOSIS — R112 Nausea with vomiting, unspecified: Secondary | ICD-10-CM

## 2020-10-02 DIAGNOSIS — R42 Dizziness and giddiness: Secondary | ICD-10-CM | POA: Insufficient documentation

## 2020-10-02 DIAGNOSIS — R630 Anorexia: Secondary | ICD-10-CM | POA: Insufficient documentation

## 2020-10-02 DIAGNOSIS — F17210 Nicotine dependence, cigarettes, uncomplicated: Secondary | ICD-10-CM | POA: Insufficient documentation

## 2020-10-02 LAB — CBC WITH DIFFERENTIAL/PLATELET
Abs Immature Granulocytes: 0.02 10*3/uL (ref 0.00–0.07)
Basophils Absolute: 0 10*3/uL (ref 0.0–0.1)
Basophils Relative: 1 %
Eosinophils Absolute: 0.2 10*3/uL (ref 0.0–0.5)
Eosinophils Relative: 3 %
HCT: 44.1 % (ref 36.0–46.0)
Hemoglobin: 14.3 g/dL (ref 12.0–15.0)
Immature Granulocytes: 0 %
Lymphocytes Relative: 23 %
Lymphs Abs: 1.8 10*3/uL (ref 0.7–4.0)
MCH: 27.4 pg (ref 26.0–34.0)
MCHC: 32.4 g/dL (ref 30.0–36.0)
MCV: 84.5 fL (ref 80.0–100.0)
Monocytes Absolute: 0.5 10*3/uL (ref 0.1–1.0)
Monocytes Relative: 6 %
Neutro Abs: 5.5 10*3/uL (ref 1.7–7.7)
Neutrophils Relative %: 67 %
Platelets: 240 10*3/uL (ref 150–400)
RBC: 5.22 MIL/uL — ABNORMAL HIGH (ref 3.87–5.11)
RDW: 14.2 % (ref 11.5–15.5)
WBC: 8.2 10*3/uL (ref 4.0–10.5)
nRBC: 0 % (ref 0.0–0.2)

## 2020-10-02 LAB — COMPREHENSIVE METABOLIC PANEL
ALT: 66 U/L — ABNORMAL HIGH (ref 0–44)
AST: 51 U/L — ABNORMAL HIGH (ref 15–41)
Albumin: 4.1 g/dL (ref 3.5–5.0)
Alkaline Phosphatase: 46 U/L (ref 38–126)
Anion gap: 9 (ref 5–15)
BUN: 8 mg/dL (ref 6–20)
CO2: 26 mmol/L (ref 22–32)
Calcium: 9.2 mg/dL (ref 8.9–10.3)
Chloride: 106 mmol/L (ref 98–111)
Creatinine, Ser: 0.63 mg/dL (ref 0.44–1.00)
GFR, Estimated: >60 mL/min (ref >60)
Glucose, Bld: 121 mg/dL — ABNORMAL HIGH (ref 70–99)
Potassium: 4.1 mmol/L (ref 3.5–5.1)
Sodium: 141 mmol/L (ref 135–145)
Total Bilirubin: 0.5 mg/dL (ref 0.3–1.2)
Total Protein: 7.4 g/dL (ref 6.5–8.1)

## 2020-10-02 LAB — PREGNANCY, URINE: Preg Test, Ur: NEGATIVE

## 2020-10-02 LAB — LIPASE, BLOOD: Lipase: 28 U/L (ref 11–51)

## 2020-10-02 MED ORDER — ONDANSETRON 4 MG PO TBDP: 4.0000 mg | ORAL_TABLET | Freq: Three times a day (TID) | ORAL | 0 refills | Status: AC | PRN

## 2020-10-02 MED ORDER — OMEPRAZOLE 20 MG PO CPDR: 20.0000 mg | DELAYED_RELEASE_CAPSULE | Freq: Every day | ORAL | 0 refills | Status: AC

## 2020-10-02 MED ORDER — ONDANSETRON HCL 4 MG/2ML IJ SOLN
4.0000 mg | Freq: Once | INTRAMUSCULAR | Status: AC
  Administered 2020-10-02: 4 mg via INTRAVENOUS
  Filled 2020-10-02: qty 2

## 2020-10-02 MED ORDER — SODIUM CHLORIDE 0.9 % IV BOLUS
500.0000 mL | Freq: Once | INTRAVENOUS | Status: AC
  Administered 2020-10-02: 500 mL via INTRAVENOUS

## 2020-10-02 NOTE — ED Provider Notes (Signed)
[facility] EMERGENCY DEPARTMENT Provider Note   CSN: [id number] Arrival date & time: 10/02/20  1404     History Chief Complaint  Patient presents with  . Emesis  . Vaginal Bleeding    Stacey Carrillo is a 30 y.o. female.  HPI Patient presents with a couple different complaints.  States that just over a week ago she started with some vaginal bleeding.  States it is little earlier than her menses was post start.  Denies possibility of pregnancy.  States that it was also red blood and instead of her normal menses.  States that bleeding has now stopped.  However patient states that on Monday with today being Wednesday the patient started nausea and vomiting diarrhea.  No blood in the diarrhea.  States that however the first episode (was a lot of red blood.  She has not had any bleeding since then.  Has had some dry heaves.  Some decreased appetite.  States she does feel little lightheaded.  Has some mild upper abdominal pain. No bleeding when she brushes her teeth. She is not on anticoagulation. Patient is unvaccinated for Covid.  States she has had a Covid exposure at work earlier this week.    Past Medical History:  Diagnosis Date  . Abnormal Pap smear   . Anemia   . Group B streptococcal infection   . Hepatitis B   . HPV (human papilloma virus) infection   . Obesity   . Preeclampsia in postpartum period 10/31/2017  . Preterm labor     Patient Active Problem List   Diagnosis Date Noted  . Obesity   . Symptomatic sinus bradycardia 11/01/2017  . H/O LEEP (loop electrosurgical excision procedure) of cervix complicating pregnancy 04/10/2017  . Severe dysplasia of cervix (CIN III) 09/25/2015  . Chronic hepatitis B (HCC) 05/18/2011    Past Surgical History:  Procedure Laterality Date  . DILATION AND CURETTAGE OF UTERUS    . LEEP       OB History    Gravida  11   Para  7   Term  7   Preterm  0   AB  4   Living  7     SAB  2   IAB  2   Ectopic  0    Multiple  0   Live Births  7           Family History  Problem Relation Age of Onset  . Hypertension Mother   . Hearing loss Brother   . Asthma Son     Social History   Tobacco Use  . Smoking status: Current Some Day Smoker    Packs/day: 0.25    Types: Cigarettes  . Smokeless tobacco: Never Used  . Tobacco comment: pt stated she is trying to quite down to 1 ciggarette/day  Vaping Use  . Vaping Use: Never used  Substance Use Topics  . Alcohol use: Yes    Comment: social  . Drug use: No    Home Medications Prior to Admission medications   Medication Sig Start Date End Date Taking? Authorizing Provider  omeprazole (PRILOSEC) 20 MG capsule Take 1 capsule (20 mg total) by mouth daily. 10/02/20  Yes Benjiman Core, MD  ondansetron (ZOFRAN-ODT) 4 MG disintegrating tablet Take 1 tablet (4 mg total) by mouth every 8 (eight) hours as needed for nausea or vomiting. 10/02/20  Yes Benjiman Core, MD  benzonatate (TESSALON) 100 MG capsule Take 1 capsule (100 mg total) by mouth every  8 (eight) hours. 01/02/20   Lamptey, Britta Mccreedy, MD  ibuprofen (ADVIL) 600 MG tablet Take 1 tablet (600 mg total) by mouth every 6 (six) hours as needed. 01/02/20   Merrilee Jansky, MD  etonogestrel-ethinyl estradiol (NUVARING) 0.12-0.015 MG/24HR vaginal ring Insert vaginally and leave in place for 3 consecutive weeks, then remove for 1 week. 12/02/17 01/02/20  [place] Bing, MD    Allergies    Patient has no known allergies.  Review of Systems   Review of Systems  Constitutional: Positive for appetite change.  HENT: Negative for congestion.   Respiratory: Negative for shortness of breath.   Cardiovascular: Negative for chest pain.  Gastrointestinal: Positive for diarrhea, nausea and vomiting.  Genitourinary: Positive for vaginal bleeding. Negative for vaginal discharge.  Skin: Negative for rash.  Neurological: Positive for light-headedness.  Hematological: Does not bruise/bleed easily.   Psychiatric/Behavioral: Negative for confusion.    Physical Exam Updated Vital Signs BP 128/76 (BP Location: Right Arm)   Pulse 80   Temp 98.6 F (37 C) (Oral)   Resp 20   Ht 5\' 7"  (1.702 m)   Wt 97.5 kg   LMP 09/12/2020   SpO2 100%   BMI 33.67 kg/m   Physical Exam Vitals and nursing note reviewed.  HENT:     Head: Normocephalic.     Right Ear: External ear normal.     Left Ear: External ear normal.     Mouth/Throat:     Mouth: Mucous membranes are moist.  Eyes:     General: No scleral icterus. Cardiovascular:     Rate and Rhythm: Regular rhythm.  Pulmonary:     Effort: Pulmonary effort is normal.  Abdominal:     Tenderness: There is no abdominal tenderness.  Genitourinary:    Comments: defered Musculoskeletal:        General: No tenderness.     Cervical back: Neck supple.  Skin:    General: Skin is warm.     Capillary Refill: Capillary refill takes less than 2 seconds.  Neurological:     Mental Status: She is alert and oriented to person, place, and time.     ED Results / Procedures / Treatments   Labs (all labs ordered are listed, but only abnormal results are displayed) Labs Reviewed  COMPREHENSIVE METABOLIC PANEL - Abnormal; Notable for the following components:      Result Value   Glucose, Bld 121 (*)    AST 51 (*)    ALT 66 (*)    All other components within normal limits  CBC WITH DIFFERENTIAL/PLATELET - Abnormal; Notable for the following components:   RBC 5.22 (*)    All other components within normal limits  SARS CORONAVIRUS 2 (TAT 6-24 HRS)  PREGNANCY, URINE  LIPASE, BLOOD    EKG None  Radiology No results found.  Procedures Procedures   Medications Ordered in ED Medications  ondansetron (ZOFRAN) injection 4 mg (4 mg Intravenous Given 10/02/20 1658)  sodium chloride 0.9 % bolus 500 mL ( Intravenous Stopped 10/02/20 1758)    ED Course  I have reviewed the triage vital signs and the nursing notes.  Pertinent labs & imaging  results that were available during my care of the patient were reviewed by me and considered in my medical decision making (see chart for details).    MDM Rules/Calculators/A&P                         Patient presents with  vaginal bleeding and emesis with some hematemesis.  Had vaginal bleeding that started near the time of her menses.  States it was however more red blood as opposed to normal menses.  That has since stopped.  Benign abdominal exam.  Discussed with patient deferred pelvic exam at this time and will follow up with her gynecologist.  Negative pregnancy test and hemoglobin reassuring.  Also states she had some vomiting.  Had vomit up some blood.  Hemoglobin reassuring.  Tolerating orals here.  Benign exam.  Patient has tolerated orals and I feels the patient benefit from GI follow-up but is not need admission to the hospital.  Could have been a Mallory-Weiss tear but doubt severe bleeding such as Boerhaave's esophagus or esophageal varices.  Discharge home with outpatient follow-up.  LFTs minimally elevated.  Final Clinical Impression(s) / ED Diagnoses Final diagnoses:  Non-intractable vomiting with nausea, unspecified vomiting type  Hematemesis, presence of nausea not specified  Abnormal vaginal bleeding    Rx / DC Orders ED Discharge Orders         Ordered    omeprazole (PRILOSEC) 20 MG capsule  Daily        10/02/20 1759    ondansetron (ZOFRAN-ODT) 4 MG disintegrating tablet  Every 8 hours PRN        10/02/20 1759           Benjiman Core, MD 10/02/20 1925

## 2020-10-02 NOTE — ED Triage Notes (Signed)
Patient is being discharged from the Urgent Care and sent to the Emergency Department via POV . Per Leanna Battles, NP, patient is in need of higher level of care due to Vomiting blood and abdominal pain. Patient is aware and verbalizes understanding of plan of care.  Vitals:   10/02/20 1314  BP: 129/88  Pulse: 84  Resp: 16  Temp: 98.3 F (36.8 C)  SpO2: 100%

## 2020-10-02 NOTE — ED Notes (Signed)
Black coffee provided to pt for po challnege

## 2020-10-02 NOTE — Discharge Instructions (Signed)
Follow up with Gastroenterology for the  vomiting blood and Gynecology for the change in your vaginal bleeding.

## 2020-10-02 NOTE — ED Triage Notes (Addendum)
Pt presents with vaginal bleeding that started 7 days ago. States the vaginal bleeding stopped yesterday. States this was not her normal menstrual cycle bleeding, this was bright red blood.   Also states having diarrhea, abdominal pain and vomiting with large amounts of blood. States had one episode of vomiting blood on Monday. States every time she burps has a metallic blood taste in mouth.

## 2020-10-02 NOTE — ED Triage Notes (Signed)
Covid positive. She is here with c.o vaginal bleeding for a week and vomiting x 3 days.

## 2020-10-03 LAB — SARS CORONAVIRUS 2 (TAT 6-24 HRS): SARS Coronavirus 2: NEGATIVE

## 2020-11-06 ENCOUNTER — Other Ambulatory Visit: Payer: Self-pay

## 2020-11-06 ENCOUNTER — Encounter (HOSPITAL_COMMUNITY): Payer: Self-pay | Admitting: *Deleted

## 2020-11-06 ENCOUNTER — Ambulatory Visit (HOSPITAL_COMMUNITY)
Admission: EM | Admit: 2020-11-06 | Discharge: 2020-11-06 | Disposition: A | Discharge status: Home / Self Care | Payer: Self-pay | Attending: Emergency Medicine | Admitting: Emergency Medicine

## 2020-11-06 DIAGNOSIS — J039 Acute tonsillitis, unspecified: Secondary | ICD-10-CM | POA: Insufficient documentation

## 2020-11-06 DIAGNOSIS — J029 Acute pharyngitis, unspecified: Secondary | ICD-10-CM

## 2020-11-06 LAB — POCT RAPID STREP A, ED / UC: Streptococcus, Group A Screen (Direct): NEGATIVE

## 2020-11-06 MED ORDER — AMOXICILLIN 500 MG PO CAPS: 500.0000 mg | ORAL_CAPSULE | Freq: Three times a day (TID) | ORAL | 0 refills | Status: AC

## 2020-11-06 NOTE — ED Triage Notes (Addendum)
Pt reports sore throat started yesterday.

## 2020-11-06 NOTE — ED Provider Notes (Signed)
[facility]    CSN: [id number] Arrival date & time: 11/06/20  1813      History   Chief Complaint Chief Complaint  Patient presents with  . Sore Throat    HPI Stacey Carrillo is a 30 y.o. female.   Patient is here for evaluation of sore throat that started yesterday.  Reports painful to swallow and muffled voice and hoarseness.  Has tried OTC pain medications with minimal relief.  Denies any recent sick contacts.  Denies any fevers, chest pain, shortness of breath, N/V/D, numbness, tingling, weakness, abdominal pain, or headaches.   ROS: As per HPI, all other pertinent ROS negative   The history is provided by the patient.  Sore Throat    Past Medical History:  Diagnosis Date  . Abnormal Pap smear   . Anemia   . Group B streptococcal infection   . Hepatitis B   . HPV (human papilloma virus) infection   . Obesity   . Preeclampsia in postpartum period 10/31/2017  . Preterm labor     Patient Active Problem List   Diagnosis Date Noted  . Obesity   . Symptomatic sinus bradycardia 11/01/2017  . H/O LEEP (loop electrosurgical excision procedure) of cervix complicating pregnancy 04/10/2017  . Severe dysplasia of cervix (CIN III) 09/25/2015  . Chronic hepatitis B (HCC) 05/18/2011    Past Surgical History:  Procedure Laterality Date  . DILATION AND CURETTAGE OF UTERUS    . LEEP      OB History    Gravida  11   Para  7   Term  7   Preterm  0   AB  4   Living  7     SAB  2   IAB  2   Ectopic  0   Multiple  0   Live Births  7            Home Medications    Prior to Admission medications   Medication Sig Start Date End Date Taking? Authorizing Provider  amoxicillin (AMOXIL) 500 MG capsule Take 1 capsule (500 mg total) by mouth 3 (three) times daily for 10 days. 11/06/20 11/16/20 Yes Ivette Loyal, NP  benzonatate (TESSALON) 100 MG capsule Take 1 capsule (100 mg total) by mouth every 8 (eight) hours. 01/02/20   Lamptey, Britta Mccreedy, MD   ibuprofen (ADVIL) 600 MG tablet Take 1 tablet (600 mg total) by mouth every 6 (six) hours as needed. 01/02/20   Merrilee Jansky, MD  omeprazole (PRILOSEC) 20 MG capsule Take 1 capsule (20 mg total) by mouth daily. 10/02/20   Benjiman Core, MD  ondansetron (ZOFRAN-ODT) 4 MG disintegrating tablet Take 1 tablet (4 mg total) by mouth every 8 (eight) hours as needed for nausea or vomiting. 10/02/20   Benjiman Core, MD  etonogestrel-ethinyl estradiol (NUVARING) 0.12-0.015 MG/24HR vaginal ring Insert vaginally and leave in place for 3 consecutive weeks, then remove for 1 week. 12/02/17 01/02/20  [place] Bing, MD    Family History Family History  Problem Relation Age of Onset  . Hypertension Mother   . Hearing loss Brother   . Asthma Son     Social History Social History   Tobacco Use  . Smoking status: Current Some Day Smoker    Packs/day: 0.25    Types: Cigarettes  . Smokeless tobacco: Never Used  . Tobacco comment: pt stated she is trying to quite down to 1 ciggarette/day  Vaping Use  . Vaping Use: Never used  Substance Use Topics  . Alcohol use: Yes    Comment: social  . Drug use: No     Allergies   Patient has no known allergies.   Review of Systems Review of Systems  HENT: Positive for sore throat.      Physical Exam Triage Vital Signs ED Triage Vitals  Enc Vitals Group     BP 11/06/20 1827 123/80     Pulse Rate 11/06/20 1827 84     Resp 11/06/20 1827 16     Temp 11/06/20 1827 98.4 F (36.9 C)     Temp Source 11/06/20 1827 Oral     SpO2 11/06/20 1827 96 %     Weight --      Height --      Head Circumference --      Peak Flow --      Pain Score 11/06/20 1829 8     Pain Loc --      Pain Edu? --      Excl. in GC? --    No data found.  Updated Vital Signs BP 123/80 (BP Location: Right Arm)   Pulse 84   Temp 98.4 F (36.9 C) (Oral)   Resp 16   LMP 10/30/2020   SpO2 96%   Visual Acuity Right Eye Distance:   Left Eye Distance:   Bilateral  Distance:    Right Eye Near:   Left Eye Near:    Bilateral Near:     Physical Exam Vitals and nursing note reviewed.  Constitutional:      General: She is not in acute distress.    Appearance: Normal appearance. She is not ill-appearing, toxic-appearing or diaphoretic.  HENT:     Head: Normocephalic and atraumatic.     Mouth/Throat:     Tonsils: Tonsillar exudate present. No tonsillar abscesses. 2+ on the right. 2+ on the left.  Eyes:     Conjunctiva/sclera: Conjunctivae normal.  Cardiovascular:     Rate and Rhythm: Normal rate.     Pulses: Normal pulses.  Pulmonary:     Effort: Pulmonary effort is normal.  Abdominal:     General: Abdomen is flat.  Musculoskeletal:        General: Normal range of motion.     Cervical back: Normal range of motion.  Skin:    General: Skin is warm and dry.  Neurological:     General: No focal deficit present.     Mental Status: She is alert and oriented to person, place, and time.  Psychiatric:        Mood and Affect: Mood normal.      UC Treatments / Results  Labs (all labs ordered are listed, but only abnormal results are displayed) Labs Reviewed  CULTURE, GROUP A STREP [facility])  POCT RAPID STREP A, ED / UC    EKG   Radiology No results found.  Procedures Procedures (including critical care time)  Medications Ordered in UC Medications - No data to display  Initial Impression / Assessment and Plan / UC Course  I have reviewed the triage vital signs and the nursing notes.  Pertinent labs & imaging results that were available during my care of the patient were reviewed by me and considered in my medical decision making (see chart for details).    Tonsillitis  Rapid strep negative in office.  Based on exam and [facility] criteria will treat with amoxicillin twice daily x10 days throat culture pending.  Tylenol and ibuprofen as needed for  fever and pain.  Discussed throat comfort measures including hot beverages, cool beverages  and popsicles, throat sprays and lozenges. Follow-up with primary care as needed Final Clinical Impressions(s) / UC Diagnoses   Final diagnoses:  Tonsillitis     Discharge Instructions     Take the Amoxil twice a day for the next 10 days.  Ibuprofen/Tylenol as needed for fever reduction and pain relief.  Warm beverages, including hot teas and hot water with lemon, can help to relieve throat pain.  You can also gargle salt water and use throat sprays/lozenges.    Return or go to the Emergency Department if symptoms worsen or do not improve in the next few days.       ED Prescriptions    Medication Sig Dispense Auth. Provider   amoxicillin (AMOXIL) 500 MG capsule Take 1 capsule (500 mg total) by mouth 3 (three) times daily for 10 days. 30 capsule Ivette Loyal, NP     PDMP not reviewed this encounter.   Ivette Loyal, NP 11/06/20 Ernestina [place]

## 2020-11-06 NOTE — Discharge Instructions (Addendum)
Take the Amoxil twice a day for the next 10 days.  Ibuprofen/Tylenol as needed for fever reduction and pain relief.  Warm beverages, including hot teas and hot water with lemon, can help to relieve throat pain.  You can also gargle salt water and use throat sprays/lozenges.    Return or go to the Emergency Department if symptoms worsen or do not improve in the next few days.

## 2020-11-09 LAB — CULTURE, GROUP A STREP (THRC)
